# Patient Record
Sex: Male | Born: 1940
Health system: Southern US, Community
[De-identification: ages and names within clinical notes are randomized; demographics above are authoritative.]

## PROBLEM LIST (undated history)

## (undated) DIAGNOSIS — M5430 Sciatica, unspecified side: Secondary | ICD-10-CM

## (undated) DIAGNOSIS — E119 Type 2 diabetes mellitus without complications: Secondary | ICD-10-CM

## (undated) DIAGNOSIS — E785 Hyperlipidemia, unspecified: Secondary | ICD-10-CM

## (undated) DIAGNOSIS — R296 Repeated falls: Secondary | ICD-10-CM

## (undated) DIAGNOSIS — I10 Essential (primary) hypertension: Secondary | ICD-10-CM

## (undated) DIAGNOSIS — I82409 Acute embolism and thrombosis of unspecified deep veins of unspecified lower extremity: Secondary | ICD-10-CM

## (undated) DIAGNOSIS — Z7901 Long term (current) use of anticoagulants: Secondary | ICD-10-CM

## (undated) DIAGNOSIS — E11319 Type 2 diabetes mellitus with unspecified diabetic retinopathy without macular edema: Secondary | ICD-10-CM

## (undated) DIAGNOSIS — G629 Polyneuropathy, unspecified: Secondary | ICD-10-CM

## (undated) HISTORY — DX: Long term (current) use of anticoagulants: Z79.01

## (undated) HISTORY — PX: PILONIDAL CYST EXCISION: SHX744

## (undated) HISTORY — DX: Repeated falls: R29.6

## (undated) HISTORY — DX: Type 2 diabetes mellitus with unspecified diabetic retinopathy without macular edema: E11.319

---

## 1999-03-02 ENCOUNTER — Encounter: Payer: Self-pay | Admitting: Family Medicine

## 1999-03-02 ENCOUNTER — Encounter: Admission: RE | Admit: 1999-03-02 | Discharge: 1999-03-02 | Payer: Self-pay | Admitting: Family Medicine

## 2005-07-19 ENCOUNTER — Encounter: Admission: RE | Admit: 2005-07-19 | Discharge: 2005-07-19 | Payer: Self-pay | Admitting: Internal Medicine

## 2005-08-11 ENCOUNTER — Encounter: Admission: RE | Admit: 2005-08-11 | Discharge: 2005-08-11 | Payer: Self-pay | Admitting: Neurosurgery

## 2009-07-16 ENCOUNTER — Encounter: Admission: RE | Admit: 2009-07-16 | Discharge: 2009-07-16 | Payer: Self-pay | Admitting: Internal Medicine

## 2014-02-05 ENCOUNTER — Ambulatory Visit
Admission: RE | Admit: 2014-02-05 | Discharge: 2014-02-05 | Disposition: A | Discharge status: Home / Self Care | Payer: Medicare Other | Source: Ambulatory Visit | Attending: Internal Medicine | Admitting: Internal Medicine

## 2014-02-05 ENCOUNTER — Other Ambulatory Visit: Payer: Self-pay | Admitting: Internal Medicine

## 2014-02-05 DIAGNOSIS — S060X0A Concussion without loss of consciousness, initial encounter: Secondary | ICD-10-CM

## 2014-04-06 ENCOUNTER — Other Ambulatory Visit: Payer: Self-pay | Admitting: Internal Medicine

## 2014-04-06 DIAGNOSIS — R531 Weakness: Secondary | ICD-10-CM

## 2014-04-07 ENCOUNTER — Encounter (HOSPITAL_COMMUNITY)
Admission: EM | Disposition: A | Discharge status: Home / Self Care | Payer: Self-pay | Source: Home / Self Care | Attending: Neurosurgery

## 2014-04-07 ENCOUNTER — Ambulatory Visit
Admission: RE | Admit: 2014-04-07 | Discharge: 2014-04-07 | Disposition: A | Discharge status: Home / Self Care | Payer: Medicare Other | Source: Ambulatory Visit | Attending: Internal Medicine | Admitting: Internal Medicine

## 2014-04-07 ENCOUNTER — Encounter (HOSPITAL_COMMUNITY): Payer: Self-pay | Admitting: *Deleted

## 2014-04-07 ENCOUNTER — Emergency Department (HOSPITAL_COMMUNITY): Payer: Medicare Other | Admitting: Anesthesiology

## 2014-04-07 ENCOUNTER — Inpatient Hospital Stay (HOSPITAL_COMMUNITY)
Admission: EM | Admit: 2014-04-07 | Discharge: 2014-04-13 | DRG: 027 | Disposition: A | Discharge status: Home / Self Care | Payer: Medicare Other | Attending: Neurosurgery | Admitting: Neurosurgery

## 2014-04-07 DIAGNOSIS — S065X9A Traumatic subdural hemorrhage with loss of consciousness of unspecified duration, initial encounter: Secondary | ICD-10-CM

## 2014-04-07 DIAGNOSIS — Z7901 Long term (current) use of anticoagulants: Secondary | ICD-10-CM | POA: Diagnosis not present

## 2014-04-07 DIAGNOSIS — E119 Type 2 diabetes mellitus without complications: Secondary | ICD-10-CM | POA: Diagnosis present

## 2014-04-07 DIAGNOSIS — S065XAA Traumatic subdural hemorrhage with loss of consciousness status unknown, initial encounter: Secondary | ICD-10-CM

## 2014-04-07 DIAGNOSIS — S065X0A Traumatic subdural hemorrhage without loss of consciousness, initial encounter: Secondary | ICD-10-CM | POA: Diagnosis present

## 2014-04-07 DIAGNOSIS — G629 Polyneuropathy, unspecified: Secondary | ICD-10-CM | POA: Diagnosis present

## 2014-04-07 DIAGNOSIS — Z86718 Personal history of other venous thrombosis and embolism: Secondary | ICD-10-CM | POA: Diagnosis not present

## 2014-04-07 DIAGNOSIS — W19XXXA Unspecified fall, initial encounter: Secondary | ICD-10-CM | POA: Diagnosis present

## 2014-04-07 DIAGNOSIS — I6203 Nontraumatic chronic subdural hemorrhage: Secondary | ICD-10-CM | POA: Diagnosis present

## 2014-04-07 DIAGNOSIS — I10 Essential (primary) hypertension: Secondary | ICD-10-CM | POA: Diagnosis present

## 2014-04-07 DIAGNOSIS — M5430 Sciatica, unspecified side: Secondary | ICD-10-CM | POA: Diagnosis present

## 2014-04-07 DIAGNOSIS — R531 Weakness: Secondary | ICD-10-CM

## 2014-04-07 DIAGNOSIS — E785 Hyperlipidemia, unspecified: Secondary | ICD-10-CM | POA: Diagnosis present

## 2014-04-07 HISTORY — DX: Polyneuropathy, unspecified: G62.9

## 2014-04-07 HISTORY — DX: Essential (primary) hypertension: I10

## 2014-04-07 HISTORY — DX: Acute embolism and thrombosis of unspecified deep veins of unspecified lower extremity: I82.409

## 2014-04-07 HISTORY — DX: Sciatica, unspecified side: M54.30

## 2014-04-07 HISTORY — PX: CRANIOTOMY: SHX93

## 2014-04-07 HISTORY — DX: Type 2 diabetes mellitus without complications: E11.9

## 2014-04-07 HISTORY — DX: Hyperlipidemia, unspecified: E78.5

## 2014-04-07 LAB — BASIC METABOLIC PANEL
Anion gap: 6 (ref 5–15)
BUN: 28 mg/dL — ABNORMAL HIGH (ref 6–23)
CO2: 30 mmol/L (ref 19–32)
Calcium: 9.5 mg/dL (ref 8.4–10.5)
Chloride: 105 mmol/L (ref 96–112)
Creatinine, Ser: 1.19 mg/dL (ref 0.50–1.35)
GFR calc Af Amer: 68 mL/min — ABNORMAL LOW (ref >90)
GFR calc non Af Amer: 59 mL/min — ABNORMAL LOW (ref >90)
Glucose, Bld: 124 mg/dL — ABNORMAL HIGH (ref 70–99)
Potassium: 3.8 mmol/L (ref 3.5–5.1)
Sodium: 141 mmol/L (ref 135–145)

## 2014-04-07 LAB — APTT: aPTT: 29 seconds (ref 24–37)

## 2014-04-07 LAB — CBC
HCT: 35.8 % — ABNORMAL LOW (ref 39.0–52.0)
Hemoglobin: 12.2 g/dL — ABNORMAL LOW (ref 13.0–17.0)
MCH: 31.8 pg (ref 26.0–34.0)
MCHC: 34.1 g/dL (ref 30.0–36.0)
MCV: 93.2 fL (ref 78.0–100.0)
Platelets: 290 10*3/uL (ref 150–400)
RBC: 3.84 MIL/uL — ABNORMAL LOW (ref 4.22–5.81)
RDW: 13.1 % (ref 11.5–15.5)
WBC: 10.7 10*3/uL — ABNORMAL HIGH (ref 4.0–10.5)

## 2014-04-07 LAB — PROTIME-INR
INR: 2.3 — ABNORMAL HIGH (ref 0.00–1.49)
INR: 1.11 (ref 0.00–1.49)
Prothrombin Time: 25.5 seconds — ABNORMAL HIGH (ref 11.6–15.2)
Prothrombin Time: 14.4 seconds (ref 11.6–15.2)

## 2014-04-07 LAB — ABO/RH: ABO/RH(D): A POS

## 2014-04-07 LAB — TYPE AND SCREEN
ABO/RH(D): A POS
Antibody Screen: NEGATIVE

## 2014-04-07 LAB — I-STAT TROPONIN, ED: Troponin i, poc: 0 ng/mL (ref 0.00–0.08)

## 2014-04-07 SURGERY — CRANIOTOMY HEMATOMA EVACUATION SUBDURAL
Anesthesia: General | Site: Head | Laterality: Bilateral

## 2014-04-07 SURGERY — CRANIOTOMY HEMATOMA EVACUATION SUBDURAL
Anesthesia: General

## 2014-04-07 MED ORDER — BACITRACIN 50000 UNITS IM SOLR
INTRAMUSCULAR | Status: DC | PRN
  Administered 2014-04-07: 500 mL

## 2014-04-07 MED ORDER — GLYCOPYRROLATE 0.2 MG/ML IJ SOLN
INTRAMUSCULAR | Status: DC | PRN
  Administered 2014-04-07: .8 mg via INTRAVENOUS

## 2014-04-07 MED ORDER — PROTHROMBIN COMPLEX CONC HUMAN 500 UNITS IV KIT
2642.0000 [IU] | PACK | Status: AC
  Administered 2014-04-07: 2642 [IU] via INTRAVENOUS
  Filled 2014-04-07: qty 106

## 2014-04-07 MED ORDER — SODIUM CHLORIDE 0.9 % IJ SOLN
INTRAMUSCULAR | Status: AC
  Filled 2014-04-07: qty 10

## 2014-04-07 MED ORDER — PROMETHAZINE HCL 25 MG PO TABS: 12.5000 mg | ORAL_TABLET | ORAL | Status: DC | PRN

## 2014-04-07 MED ORDER — LABETALOL HCL 5 MG/ML IV SOLN
INTRAVENOUS | Status: AC
  Filled 2014-04-07: qty 4

## 2014-04-07 MED ORDER — LIDOCAINE-EPINEPHRINE 1 %-1:100000 IJ SOLN
INTRAMUSCULAR | Status: DC | PRN
  Administered 2014-04-07: 10 mL via INTRADERMAL

## 2014-04-07 MED ORDER — FENTANYL CITRATE 0.05 MG/ML IJ SOLN
INTRAMUSCULAR | Status: DC | PRN
  Administered 2014-04-07: 100 ug via INTRAVENOUS
  Administered 2014-04-07: 25 ug via INTRAVENOUS
  Administered 2014-04-07: 50 ug via INTRAVENOUS
  Administered 2014-04-07: 25 ug via INTRAVENOUS

## 2014-04-07 MED ORDER — ROCURONIUM BROMIDE 50 MG/5ML IV SOLN
INTRAVENOUS | Status: AC
  Filled 2014-04-07: qty 2

## 2014-04-07 MED ORDER — POTASSIUM CHLORIDE IN NACL 20-0.45 MEQ/L-% IV SOLN
INTRAVENOUS | Status: DC
  Administered 2014-04-08 – 2014-04-10 (×5): via INTRAVENOUS
  Filled 2014-04-07 (×9): qty 1000

## 2014-04-07 MED ORDER — HYDROCODONE-ACETAMINOPHEN 5-325 MG PO TABS
1.0000 | ORAL_TABLET | ORAL | Status: DC | PRN
  Administered 2014-04-08 – 2014-04-12 (×10): 1 via ORAL
  Filled 2014-04-07 (×10): qty 1

## 2014-04-07 MED ORDER — GLYCOPYRROLATE 0.2 MG/ML IJ SOLN
INTRAMUSCULAR | Status: AC
  Filled 2014-04-07: qty 5

## 2014-04-07 MED ORDER — ACETAMINOPHEN 650 MG RE SUPP: 650.0000 mg | RECTAL | Status: DC | PRN

## 2014-04-07 MED ORDER — FENTANYL CITRATE 0.05 MG/ML IJ SOLN
INTRAMUSCULAR | Status: AC
  Filled 2014-04-07: qty 5

## 2014-04-07 MED ORDER — LIDOCAINE HCL (CARDIAC) 20 MG/ML IV SOLN
INTRAVENOUS | Status: DC | PRN
  Administered 2014-04-07: 80 mg via INTRAVENOUS

## 2014-04-07 MED ORDER — ROCURONIUM BROMIDE 100 MG/10ML IV SOLN
INTRAVENOUS | Status: DC | PRN
  Administered 2014-04-07: 10 mg via INTRAVENOUS
  Administered 2014-04-07: 40 mg via INTRAVENOUS
  Administered 2014-04-07: 10 mg via INTRAVENOUS

## 2014-04-07 MED ORDER — EPHEDRINE SULFATE 50 MG/ML IJ SOLN
INTRAMUSCULAR | Status: AC
Start: 2014-04-07 — End: 2014-04-07
  Filled 2014-04-07: qty 1

## 2014-04-07 MED ORDER — PROPOFOL 10 MG/ML IV BOLUS
INTRAVENOUS | Status: DC | PRN
  Administered 2014-04-07: 120 mg via INTRAVENOUS
  Administered 2014-04-07: 40 mg via INTRAVENOUS

## 2014-04-07 MED ORDER — NEOSTIGMINE METHYLSULFATE 10 MG/10ML IV SOLN
INTRAVENOUS | Status: AC
Start: 2014-04-07 — End: 2014-04-07
  Filled 2014-04-07: qty 1

## 2014-04-07 MED ORDER — ONDANSETRON HCL 4 MG/2ML IJ SOLN
INTRAMUSCULAR | Status: AC
  Filled 2014-04-07: qty 2

## 2014-04-07 MED ORDER — NALOXONE HCL 0.4 MG/ML IJ SOLN: 0.0800 mg | INTRAMUSCULAR | Status: DC | PRN

## 2014-04-07 MED ORDER — CEFAZOLIN SODIUM-DEXTROSE 2-3 GM-% IV SOLR
2.0000 g | Freq: Three times a day (TID) | INTRAVENOUS | Status: AC
  Administered 2014-04-08 (×2): 2 g via INTRAVENOUS
  Filled 2014-04-07 (×2): qty 50

## 2014-04-07 MED ORDER — CEFAZOLIN SODIUM-DEXTROSE 2-3 GM-% IV SOLR
INTRAVENOUS | Status: DC | PRN
  Administered 2014-04-07: 2 g via INTRAVENOUS

## 2014-04-07 MED ORDER — ACETAMINOPHEN 325 MG PO TABS: 650.0000 mg | ORAL_TABLET | ORAL | Status: DC | PRN

## 2014-04-07 MED ORDER — LABETALOL HCL 5 MG/ML IV SOLN
INTRAVENOUS | Status: DC | PRN
  Administered 2014-04-07 (×2): 5 mg via INTRAVENOUS

## 2014-04-07 MED ORDER — PHENYLEPHRINE 40 MCG/ML (10ML) SYRINGE FOR IV PUSH (FOR BLOOD PRESSURE SUPPORT)
PREFILLED_SYRINGE | INTRAVENOUS | Status: AC
  Filled 2014-04-07: qty 10

## 2014-04-07 MED ORDER — ONDANSETRON HCL 4 MG/2ML IJ SOLN
INTRAMUSCULAR | Status: DC | PRN
  Administered 2014-04-07: 4 mg via INTRAVENOUS

## 2014-04-07 MED ORDER — SODIUM CHLORIDE 0.9 % IV SOLN
INTRAVENOUS | Status: DC | PRN
Start: 2014-04-07 — End: 2014-04-07
  Administered 2014-04-07 (×2): via INTRAVENOUS

## 2014-04-07 MED ORDER — DOCUSATE SODIUM 100 MG PO CAPS
100.0000 mg | ORAL_CAPSULE | Freq: Two times a day (BID) | ORAL | Status: DC
Start: 2014-04-07 — End: 2014-04-13
  Administered 2014-04-08 – 2014-04-13 (×11): 100 mg via ORAL
  Filled 2014-04-07 (×13): qty 1

## 2014-04-07 MED ORDER — ONDANSETRON HCL 4 MG PO TABS: 4.0000 mg | ORAL_TABLET | ORAL | Status: DC | PRN

## 2014-04-07 MED ORDER — BISACODYL 5 MG PO TBEC
5.0000 mg | DELAYED_RELEASE_TABLET | Freq: Every day | ORAL | Status: DC | PRN
  Filled 2014-04-07: qty 1

## 2014-04-07 MED ORDER — ONDANSETRON HCL 4 MG/2ML IJ SOLN: 4.0000 mg | INTRAMUSCULAR | Status: DC | PRN

## 2014-04-07 MED ORDER — HYDROMORPHONE HCL 1 MG/ML IJ SOLN
0.5000 mg | INTRAMUSCULAR | Status: DC | PRN
  Administered 2014-04-07: 1 mg via INTRAVENOUS
  Filled 2014-04-07: qty 1

## 2014-04-07 MED ORDER — PHENYLEPHRINE HCL 10 MG/ML IJ SOLN
INTRAMUSCULAR | Status: DC | PRN
  Administered 2014-04-07: 80 ug via INTRAVENOUS

## 2014-04-07 MED ORDER — LABETALOL HCL 5 MG/ML IV SOLN
10.0000 mg | INTRAVENOUS | Status: DC | PRN
  Administered 2014-04-07: 10 mg via INTRAVENOUS
  Administered 2014-04-09 (×2): 20 mg via INTRAVENOUS
  Administered 2014-04-09: 10 mg via INTRAVENOUS
  Administered 2014-04-09 – 2014-04-13 (×6): 20 mg via INTRAVENOUS
  Filled 2014-04-07: qty 8
  Filled 2014-04-07 (×10): qty 4

## 2014-04-07 MED ORDER — NEOSTIGMINE METHYLSULFATE 10 MG/10ML IV SOLN
INTRAVENOUS | Status: AC
  Filled 2014-04-07: qty 2

## 2014-04-07 MED ORDER — VITAMIN K1 10 MG/ML IJ SOLN
10.0000 mg | INTRAVENOUS | Status: AC
  Administered 2014-04-07: 10 mg via INTRAVENOUS
  Filled 2014-04-07: qty 1

## 2014-04-07 MED ORDER — NEOSTIGMINE METHYLSULFATE 10 MG/10ML IV SOLN
INTRAVENOUS | Status: DC | PRN
  Administered 2014-04-07: 5 mg via INTRAVENOUS

## 2014-04-07 MED ORDER — LIDOCAINE HCL (CARDIAC) 20 MG/ML IV SOLN
INTRAVENOUS | Status: AC
  Filled 2014-04-07: qty 5

## 2014-04-07 MED ORDER — PROPOFOL 10 MG/ML IV BOLUS
INTRAVENOUS | Status: AC
  Filled 2014-04-07: qty 20

## 2014-04-07 MED ORDER — PANTOPRAZOLE SODIUM 40 MG IV SOLR
40.0000 mg | Freq: Every day | INTRAVENOUS | Status: DC
  Administered 2014-04-08 – 2014-04-09 (×3): 40 mg via INTRAVENOUS
  Filled 2014-04-07 (×4): qty 40

## 2014-04-07 MED ORDER — 0.9 % SODIUM CHLORIDE (POUR BTL) OPTIME
TOPICAL | Status: DC | PRN
  Administered 2014-04-07 (×2): 1000 mL

## 2014-04-07 MED ORDER — DEXTROSE 5 % IV SOLN
10.0000 mg | INTRAVENOUS | Status: DC | PRN
  Administered 2014-04-07: 20 ug/min via INTRAVENOUS

## 2014-04-07 MED ORDER — THROMBIN 20000 UNITS EX SOLR
CUTANEOUS | Status: DC | PRN
  Administered 2014-04-07: 20 mL via TOPICAL

## 2014-04-07 SURGICAL SUPPLY — 66 items
APL SKNCLS STERI-STRIP NONHPOA (GAUZE/BANDAGES/DRESSINGS) ×1
BAG DECANTER FOR FLEXI CONT (MISCELLANEOUS) ×2 IMPLANT
BANDAGE GAUZE 4  KLING STR (GAUZE/BANDAGES/DRESSINGS) IMPLANT
BENZOIN TINCTURE PRP APPL 2/3 (GAUZE/BANDAGES/DRESSINGS) ×2 IMPLANT
BIT DRILL WIRE PASS 1.3MM (BIT) ×1 IMPLANT
BLADE CLIPPER SURG (BLADE) ×2 IMPLANT
BNDG GAUZE ELAST 4 BULKY (GAUZE/BANDAGES/DRESSINGS) IMPLANT
BRUSH SCRUB EZ 1% IODOPHOR (MISCELLANEOUS) IMPLANT
BRUSH SCRUB EZ PLAIN DRY (MISCELLANEOUS) ×2 IMPLANT
BUR ROUTER D-58 CRANI (BURR) ×1 IMPLANT
CANISTER SUCT 3000ML PPV (MISCELLANEOUS) ×2 IMPLANT
CLIP TI MEDIUM 6 (CLIP) IMPLANT
CONT SPEC 4OZ CLIKSEAL STRL BL (MISCELLANEOUS) ×2 IMPLANT
COVER BACK TABLE 60X90IN (DRAPES) ×4 IMPLANT
DRAPE NEUROLOGICAL W/INCISE (DRAPES) ×2 IMPLANT
DRAPE SURG 17X23 STRL (DRAPES) IMPLANT
DRAPE WARM FLUID 44X44 (DRAPE) ×2 IMPLANT
DRILL WIRE PASS 1.3MM (BIT) ×2
DRSG OPSITE POSTOP 4X6 (GAUZE/BANDAGES/DRESSINGS) ×2 IMPLANT
DRSG TELFA 3X8 NADH (GAUZE/BANDAGES/DRESSINGS) ×2 IMPLANT
ELECT CAUTERY BLADE 6.4 (BLADE) ×2 IMPLANT
ELECT REM PT RETURN 9FT ADLT (ELECTROSURGICAL) ×2
ELECTRODE REM PT RTRN 9FT ADLT (ELECTROSURGICAL) ×1 IMPLANT
GAUZE SPONGE 4X4 12PLY STRL (GAUZE/BANDAGES/DRESSINGS) IMPLANT
GAUZE SPONGE 4X4 16PLY XRAY LF (GAUZE/BANDAGES/DRESSINGS) IMPLANT
GLOVE ECLIPSE 8.0 STRL XLNG CF (GLOVE) ×2 IMPLANT
GLOVE EXAM NITRILE LRG STRL (GLOVE) IMPLANT
GLOVE EXAM NITRILE XL STR (GLOVE) IMPLANT
GLOVE EXAM NITRILE XS STR PU (GLOVE) IMPLANT
GOWN STRL REUS W/ TWL LRG LVL3 (GOWN DISPOSABLE) IMPLANT
GOWN STRL REUS W/ TWL XL LVL3 (GOWN DISPOSABLE) IMPLANT
GOWN STRL REUS W/TWL 2XL LVL3 (GOWN DISPOSABLE) ×2 IMPLANT
GOWN STRL REUS W/TWL LRG LVL3 (GOWN DISPOSABLE)
GOWN STRL REUS W/TWL XL LVL3 (GOWN DISPOSABLE)
HEMOSTAT SURGICEL 2X14 (HEMOSTASIS) ×2 IMPLANT
KIT BASIN OR (CUSTOM PROCEDURE TRAY) ×2 IMPLANT
KIT ROOM TURNOVER OR (KITS) ×2 IMPLANT
NEEDLE HYPO 22GX1.5 SAFETY (NEEDLE) ×2 IMPLANT
NS IRRIG 1000ML POUR BTL (IV SOLUTION) ×2 IMPLANT
PACK CRANIOTOMY (CUSTOM PROCEDURE TRAY) ×2 IMPLANT
PAD ARMBOARD 7.5X6 YLW CONV (MISCELLANEOUS) ×2 IMPLANT
PAD DRESSING TELFA 3X8 NADH (GAUZE/BANDAGES/DRESSINGS) ×1 IMPLANT
PAD EYE OVAL STERILE LF (GAUZE/BANDAGES/DRESSINGS) IMPLANT
PATTIES SURGICAL .25X.25 (GAUZE/BANDAGES/DRESSINGS) IMPLANT
PATTIES SURGICAL .5 X.5 (GAUZE/BANDAGES/DRESSINGS) IMPLANT
PATTIES SURGICAL .5 X3 (DISPOSABLE) IMPLANT
PATTIES SURGICAL .75X.75 (GAUZE/BANDAGES/DRESSINGS) ×2 IMPLANT
PATTIES SURGICAL 1X1 (DISPOSABLE) IMPLANT
PERFORATOR LRG  14-11MM (BIT) ×2
PERFORATOR LRG 14-11MM (BIT) ×1 IMPLANT
PIN MAYFIELD SKULL DISP (PIN) IMPLANT
PLATE 1.5  2HOLE LNG NEURO (Plate) ×12 IMPLANT
PLATE 1.5 2HOLE LNG NEURO (Plate) IMPLANT
SCREW SELF DRILL HT 1.5/4MM (Screw) ×12 IMPLANT
SPECIMEN JAR SMALL (MISCELLANEOUS) IMPLANT
SPONGE NEURO XRAY DETECT 1X3 (DISPOSABLE) IMPLANT
SPONGE SURGIFOAM ABS GEL 100 (HEMOSTASIS) ×2 IMPLANT
STAPLER SKIN PROX WIDE 3.9 (STAPLE) ×2 IMPLANT
SUT NURALON 4 0 TR CR/8 (SUTURE) ×4 IMPLANT
SUT VIC AB 2-0 OS6 18 (SUTURE) ×6 IMPLANT
SYR CONTROL 10ML LL (SYRINGE) ×2 IMPLANT
TOWEL OR 17X24 6PK STRL BLUE (TOWEL DISPOSABLE) ×2 IMPLANT
TOWEL OR 17X26 10 PK STRL BLUE (TOWEL DISPOSABLE) ×2 IMPLANT
TRAY FOLEY CATH 14FRSI W/METER (CATHETERS) IMPLANT
UNDERPAD 30X30 INCONTINENT (UNDERPADS AND DIAPERS) IMPLANT
WATER STERILE IRR 1000ML POUR (IV SOLUTION) ×2 IMPLANT

## 2014-04-07 SURGICAL SUPPLY — 62 items
APL SKNCLS STERI-STRIP NONHPOA (GAUZE/BANDAGES/DRESSINGS) ×1
BAG DECANTER FOR FLEXI CONT (MISCELLANEOUS) ×3 IMPLANT
BANDAGE GAUZE 4  KLING STR (GAUZE/BANDAGES/DRESSINGS) IMPLANT
BENZOIN TINCTURE PRP APPL 2/3 (GAUZE/BANDAGES/DRESSINGS) ×3 IMPLANT
BIT DRILL WIRE PASS 1.3MM (BIT) ×2 IMPLANT
BLADE CLIPPER SURG (BLADE) ×3 IMPLANT
BNDG GAUZE ELAST 4 BULKY (GAUZE/BANDAGES/DRESSINGS) IMPLANT
BRUSH SCRUB EZ 1% IODOPHOR (MISCELLANEOUS) IMPLANT
BRUSH SCRUB EZ PLAIN DRY (MISCELLANEOUS) ×3 IMPLANT
BUR ROUTER D-58 CRANI (BURR) IMPLANT
CANISTER SUCT 3000ML PPV (MISCELLANEOUS) ×3 IMPLANT
CLIP TI MEDIUM 6 (CLIP) IMPLANT
CONT SPEC 4OZ CLIKSEAL STRL BL (MISCELLANEOUS) ×3 IMPLANT
COVER BACK TABLE 60X90IN (DRAPES) ×6 IMPLANT
DRAPE NEUROLOGICAL W/INCISE (DRAPES) ×3 IMPLANT
DRAPE SURG 17X23 STRL (DRAPES) IMPLANT
DRAPE WARM FLUID 44X44 (DRAPE) ×3 IMPLANT
DRILL WIRE PASS 1.3MM (BIT) ×2
DRSG TELFA 3X8 NADH (GAUZE/BANDAGES/DRESSINGS) ×2 IMPLANT
ELECT CAUTERY BLADE 6.4 (BLADE) ×3 IMPLANT
ELECT REM PT RETURN 9FT ADLT (ELECTROSURGICAL) ×2
ELECTRODE REM PT RTRN 9FT ADLT (ELECTROSURGICAL) ×2 IMPLANT
GAUZE SPONGE 4X4 12PLY STRL (GAUZE/BANDAGES/DRESSINGS) IMPLANT
GAUZE SPONGE 4X4 16PLY XRAY LF (GAUZE/BANDAGES/DRESSINGS) IMPLANT
GLOVE ECLIPSE 8.0 STRL XLNG CF (GLOVE) ×3 IMPLANT
GLOVE EXAM NITRILE LRG STRL (GLOVE) IMPLANT
GLOVE EXAM NITRILE XL STR (GLOVE) IMPLANT
GLOVE EXAM NITRILE XS STR PU (GLOVE) IMPLANT
GOWN STRL REUS W/ TWL LRG LVL3 (GOWN DISPOSABLE) IMPLANT
GOWN STRL REUS W/ TWL XL LVL3 (GOWN DISPOSABLE) IMPLANT
GOWN STRL REUS W/TWL 2XL LVL3 (GOWN DISPOSABLE) ×3 IMPLANT
GOWN STRL REUS W/TWL LRG LVL3 (GOWN DISPOSABLE)
GOWN STRL REUS W/TWL XL LVL3 (GOWN DISPOSABLE)
HEMOSTAT SURGICEL 2X14 (HEMOSTASIS) ×3 IMPLANT
KIT BASIN OR (CUSTOM PROCEDURE TRAY) ×3 IMPLANT
KIT ROOM TURNOVER OR (KITS) ×3 IMPLANT
NEEDLE HYPO 22GX1.5 SAFETY (NEEDLE) ×3 IMPLANT
NS IRRIG 1000ML POUR BTL (IV SOLUTION) ×3 IMPLANT
PACK CRANIOTOMY (CUSTOM PROCEDURE TRAY) ×3 IMPLANT
PAD ARMBOARD 7.5X6 YLW CONV (MISCELLANEOUS) ×3 IMPLANT
PAD DRESSING TELFA 3X8 NADH (GAUZE/BANDAGES/DRESSINGS) ×1 IMPLANT
PAD EYE OVAL STERILE LF (GAUZE/BANDAGES/DRESSINGS) IMPLANT
PATTIES SURGICAL .25X.25 (GAUZE/BANDAGES/DRESSINGS) IMPLANT
PATTIES SURGICAL .5 X.5 (GAUZE/BANDAGES/DRESSINGS) IMPLANT
PATTIES SURGICAL .5 X3 (DISPOSABLE) IMPLANT
PATTIES SURGICAL .75X.75 (GAUZE/BANDAGES/DRESSINGS) ×3 IMPLANT
PATTIES SURGICAL 1X1 (DISPOSABLE) IMPLANT
PERFORATOR LRG  14-11MM (BIT) ×2
PERFORATOR LRG 14-11MM (BIT) ×2 IMPLANT
PIN MAYFIELD SKULL DISP (PIN) IMPLANT
SPECIMEN JAR SMALL (MISCELLANEOUS) IMPLANT
SPONGE NEURO XRAY DETECT 1X3 (DISPOSABLE) IMPLANT
SPONGE SURGIFOAM ABS GEL 100 (HEMOSTASIS) ×3 IMPLANT
STAPLER SKIN PROX WIDE 3.9 (STAPLE) ×3 IMPLANT
SUT NURALON 4 0 TR CR/8 (SUTURE) ×6 IMPLANT
SUT VIC AB 2-0 OS6 18 (SUTURE) ×9 IMPLANT
SYR CONTROL 10ML LL (SYRINGE) ×3 IMPLANT
TOWEL OR 17X24 6PK STRL BLUE (TOWEL DISPOSABLE) ×3 IMPLANT
TOWEL OR 17X26 10 PK STRL BLUE (TOWEL DISPOSABLE) ×3 IMPLANT
TRAY FOLEY CATH 14FRSI W/METER (CATHETERS) IMPLANT
UNDERPAD 30X30 INCONTINENT (UNDERPADS AND DIAPERS) IMPLANT
WATER STERILE IRR 1000ML POUR (IV SOLUTION) ×3 IMPLANT

## 2014-04-07 NOTE — Anesthesia Preprocedure Evaluation (Addendum)
Anesthesia Evaluation  Patient identified by MRN, date of birth, ID band Patient awake    Reviewed: NPO status   History of Anesthesia Complications Negative for: history of anesthetic complications  Airway Mallampati: II   Neck ROM: Full  Mouth opening: Limited Mouth Opening  Dental  (+) Teeth Intact   Pulmonary neg pulmonary ROS,  breath sounds clear to auscultation        Cardiovascular hypertension, DVT Rhythm:Regular Rate:Normal     Neuro/Psych  Neuromuscular disease    GI/Hepatic   Endo/Other  diabetes  Renal/GU      Musculoskeletal   Abdominal (+) + obese,   Peds  Hematology Had been on blood thinners   Anesthesia Other Findings   Reproductive/Obstetrics                            Anesthesia Physical Anesthesia Plan  ASA: III  Anesthesia Plan: General   Post-op Pain Management:    Induction: Intravenous  Airway Management Planned: Oral ETT  Additional Equipment:   Intra-op Plan:   Post-operative Plan: Extubation in OR  Informed Consent: I have reviewed the patients History and Physical, chart, labs and discussed the procedure including the risks, benefits and alternatives for the proposed anesthesia with the patient or authorized representative who has indicated his/her understanding and acceptance.   Dental advisory given  Plan Discussed with: CRNA and Surgeon  Anesthesia Plan Comments:         Anesthesia Quick Evaluation

## 2014-04-07 NOTE — ED Notes (Signed)
Consent obtained for bilateral craniotomy by Dr. Hal Neer.

## 2014-04-07 NOTE — ED Notes (Signed)
Pt went to pcp today due to unsteady gait and weakness for past week, had ct scan done and sent here due to bilateral subdural. Pt is awake, a&ox4 at triage.

## 2014-04-07 NOTE — ED Notes (Signed)
Dr. Hal Neer at bedside talking with patient.

## 2014-04-07 NOTE — Progress Notes (Signed)
eLink Physician-Brief Progress Note Patient Name: David Mendoza DOB: [birth year] MRN: [id number]   Date of Service  04/07/2014  HPI/Events of Note  74 yo male with PMH HLD, HTN, DM, Periferal Neuropathy and DVT. Now s/p crani and evacuation of subacute on chronic SDH. Currently on 2 L/min [place] O2 with sat = 98%. RR = 16 and BP = 147/66.  eICU Interventions  Continue current management. No intervention indicated at this time.      Intervention Category Evaluation Type: New Patient Evaluation  Lysle Dingwall 04/07/2014, 11:28 PM

## 2014-04-07 NOTE — ED Provider Notes (Signed)
Pt presents to the ED after an abnormal outpatient head CT. Physical Exam  BP 155/75 mmHg  Pulse 82  Temp(Src) 98.2 F (36.8 C) (Oral)  Resp 15  Ht 5' 9.5" (1.765 m)  Wt 216 lb 6.4 oz (98.158 kg)  BMI 31.51 kg/m2  SpO2 97%  Physical Exam  Constitutional: He is oriented to person, place, and time. He appears well-developed and well-nourished. No distress.  HENT:  Head: Normocephalic and atraumatic.  Right Ear: External ear normal.  Left Ear: External ear normal.  Eyes: Conjunctivae are normal. Right eye exhibits no discharge. Left eye exhibits no discharge. No scleral icterus.  Neck: Neck supple. No tracheal deviation present.  Cardiovascular: Normal rate.   Pulmonary/Chest: Effort normal. No stridor. No respiratory distress.  Musculoskeletal: He exhibits no edema.  Neurological: He is alert and oriented to person, place, and time. He has normal strength. No cranial nerve deficit or sensory deficit. GCS eye subscore is 4. GCS verbal subscore is 5. GCS motor subscore is 6.  Skin: Skin is warm and dry. No rash noted.  Psychiatric: He has a normal mood and affect.  Nursing note and vitals reviewed.   ED Course  Procedures  EKG Interpretation   Date/Time:  Tuesday April 07 2014 15:36:42 EDT Ventricular Rate:  89 PR Interval:  210 QRS Duration: 88 QT Interval:  360 QTC Calculation: 438 R Axis:   58 Text Interpretation:  Sinus rhythm with 1st degree A-V block Otherwise  normal ECG No old tracing to compare Confirmed by Markees Carns  MD-J, Pennye Beeghly ([id number])  on 04/07/2014 4:02:16 PM  Ct Head Wo Contrast  04/07/2014   CLINICAL DATA:  One week history of left-sided weakness and gait disturbance.  EXAM: CT HEAD WITHOUT CONTRAST  TECHNIQUE: Contiguous axial images were obtained from the base of the skull through the vertex without intravenous contrast.  COMPARISON:  February 05, 2014  FINDINGS: There are subdural hematomas bilaterally which show evidence of both acute and chronic fluid. The larger  subdural hematoma is present on the right causing mass effect on the right frontal lobe. This subdural hematoma measures 2.5 cm in maximum thickness. There is moderate mass effect from the left-sided subdural hematoma with a maximum thickness of 1.9 cm superiorly. There is more localized mass effect on the left frontal lobe more anteriorly with a maximum thickness of the subdural hematoma in this area of 1.5 cm. There is midline shift toward the left of 5 mm. There is no intra-axial mass or intra-axial hemorrhage. The gray-white compartments appear within normal limits. No acute infarct apparent. The bony calvarium appears intact. The mastoid air cells are clear.  IMPRESSION: Bilateral subdural hematomas causing mass effect. There is mixed attenuation suggesting both acute and more chronic hemorrhage in these areas. There is mild midline shift to the left. No acute infarct apparent. No intra-axial hemorrhage.  Critical Value/emergent results were called by telephone at the time of interpretation on 04/07/2014 at 2:19 pm to Dr. Josetta Huddle , who verbally acknowledged these results.   Electronically Signed   By: Lowella Grip III M.D.   On: 04/07/2014 14:19       MDM Pt has history of hygroma.  Had a fall the other night.  Balance has been gettying worse.  Outpatient ct scan today shows a subdural hematoma.  Pt is on coumadin for prior DVTs.  Will check INR.  Will likely need reversal.  He is stable right now.  INR is elevated at 2.3  Will reverse.  K Centra ordered.  Dr Hal Neer consulted.   Medical screening examination/treatment/procedure(s) were conducted as a shared visit with non-physician practitioner(s) and myself.  I personally evaluated the patient during the encounter.     Dorie Rank, MD 04/07/14 [phone]

## 2014-04-07 NOTE — ED Notes (Signed)
OR ready for patient. Pt's wife taking all belongings with her.

## 2014-04-07 NOTE — Op Note (Signed)
Preop diagnosis: Bilateral subacute and chronic subdural hematoma Postop diagnosis: Same Procedure: Bilateral frontoparietal craniotomy for evacuation of bilateral subdural Surgeon: Sandip Power  After being placed in the supine position the patient's scalp was shaved prepped and draped in the usual sterile fashion. Linear incisions were made bilaterally in the frontoparietal region and hemostasis was obtained with unipolar coagulation. A retractor was placed bilaterally. Single bur holes were placed in the exposed skull bilaterally and then a small craniotomy was performed after the dura had been separated from the bone with a Web designer. The bone flaps were removed bilaterally without difficulty and the dura was then opened in cruciate fashion. Chronic hematoma was identified with large amounts of fluid under great pressure encountered bilaterally. Irrigation was then carried out until the return was clear. 2 subdural drains were left in the subdural space and brought out through separate stab wound incisions. Gelfoam was then placed above the cranial defects after further irrigation revealed no further discolored fluid. The small bone flaps were reattached bilaterally with 3 small plates. The galea was then closed with inverted Vicryl and staples were placed on the skin. Sterile dressings were then placed bilaterally and the patient was extubated and taken to recovery room in stable condition.

## 2014-04-07 NOTE — ED Provider Notes (Signed)
CSN: [id number]     Arrival date & time 04/07/14  1521 History   First MD Initiated Contact with Patient 04/07/14 1557     Chief Complaint  Patient presents with  . Gait Problem     (Consider location/radiation/quality/duration/timing/severity/associated sxs/prior Treatment) The history is provided by the patient and the spouse. No language interpreter was used.  David Mendoza is a 74 y.o white male who presents for gradual onset weakness and "feeling off balance" for the past week which has progressively worsened. He originally fell in January and had a head CT done which showed a hematoma. He was seen by his pcp yesterday who did a repeat CT head and found the the patient had a bilateral subdural hematomas with midline shift. No pain. Walking and movement makes it worse. He denies any fever, vision changes, no headache, chest pain, shortness of breath, abdominal pain, urinary symptoms, or leg swelling.   Past Medical History  Diagnosis Date  . Hyperlipemia   . Hypertension   . Diabetes mellitus without complication   . Peripheral neuropathy   . DVT (deep venous thrombosis)   . Sciatica    History reviewed. No pertinent past surgical history. History reviewed. No pertinent family history. History  Substance Use Topics  . Smoking status: Not on file  . Smokeless tobacco: Not on file  . Alcohol Use: Not on file    Review of Systems  Musculoskeletal: Negative for neck stiffness.  All other systems reviewed and are negative.     Allergies  Sulfa antibiotics  Home Medications   Prior to Admission medications   Medication Sig Start Date End Date Taking? Authorizing Provider  amLODipine (NORVASC) 5 MG tablet Take 5 mg by mouth daily.   Yes Historical Provider, MD  glipiZIDE (GLUCOTROL) 5 MG tablet Take 5 mg by mouth 2 (two) times daily.   Yes Historical Provider, MD  hydrochlorothiazide (HYDRODIURIL) 12.5 MG tablet Take 12.5 mg by mouth daily.   Yes Historical Provider, MD   losartan (COZAAR) 100 MG tablet Take 100 mg by mouth daily.   Yes Historical Provider, MD  metFORMIN (GLUCOPHAGE) 500 MG tablet Take 1,000 mg by mouth 2 (two) times daily with a meal.   Yes Historical Provider, MD  omeprazole (PRILOSEC) 40 MG capsule Take 40 mg by mouth daily.   Yes Historical Provider, MD  rosuvastatin (CRESTOR) 20 MG tablet Take 20 mg by mouth daily.   Yes Historical Provider, MD  warfarin (COUMADIN) 10 MG tablet Take 5 mg by mouth daily.   Yes Historical Provider, MD   BP 151/80 mmHg  Pulse 76  Temp(Src) 98.2 F (36.8 C) (Oral)  Resp 13  Ht 5' 9.5" (1.765 m)  Wt 216 lb 6.4 oz (98.158 kg)  BMI 31.51 kg/m2  SpO2 99% Physical Exam  Constitutional: He is oriented to person, place, and time. He appears well-developed and well-nourished.  HENT:  Head: Normocephalic and atraumatic.  Eyes: Conjunctivae and EOM are normal.  Neck: Normal range of motion. Neck supple.  Cardiovascular: Normal rate, regular rhythm and normal heart sounds.   Pulmonary/Chest: Effort normal and breath sounds normal.  Abdominal: Soft. There is no tenderness.  Musculoskeletal: Normal range of motion.  Neurological: He is alert and oriented to person, place, and time. No sensory deficit. Coordination abnormal.  Left sided upper extremity weakness and decreased grip strength.   Skin: Skin is warm and dry.  Nursing note and vitals reviewed.   ED Course  Procedures (including critical care time)  Labs Review Labs Reviewed  CBC - Abnormal; Notable for the following:    WBC 10.7 (*)    RBC 3.84 (*)    Hemoglobin 12.2 (*)    HCT 35.8 (*)    All other components within normal limits  BASIC METABOLIC PANEL - Abnormal; Notable for the following:    Glucose, Bld 124 (*)    BUN 28 (*)    GFR calc non Af Amer 59 (*)    GFR calc Af Amer 68 (*)    All other components within normal limits  PROTIME-INR - Abnormal; Notable for the following:    Prothrombin Time 25.5 (*)    INR 2.30 (*)    All  other components within normal limits  PROTIME-INR  APTT  PROTIME-INR  PROTIME-INR  I-STAT TROPOININ, ED  TYPE AND SCREEN  ABO/RH    Imaging Review Ct Head Wo Contrast  04/07/2014   CLINICAL DATA:  One week history of left-sided weakness and gait disturbance.  EXAM: CT HEAD WITHOUT CONTRAST  TECHNIQUE: Contiguous axial images were obtained from the base of the skull through the vertex without intravenous contrast.  COMPARISON:  February 05, 2014  FINDINGS: There are subdural hematomas bilaterally which show evidence of both acute and chronic fluid. The larger subdural hematoma is present on the right causing mass effect on the right frontal lobe. This subdural hematoma measures 2.5 cm in maximum thickness. There is moderate mass effect from the left-sided subdural hematoma with a maximum thickness of 1.9 cm superiorly. There is more localized mass effect on the left frontal lobe more anteriorly with a maximum thickness of the subdural hematoma in this area of 1.5 cm. There is midline shift toward the left of 5 mm. There is no intra-axial mass or intra-axial hemorrhage. The gray-white compartments appear within normal limits. No acute infarct apparent. The bony calvarium appears intact. The mastoid air cells are clear.  IMPRESSION: Bilateral subdural hematomas causing mass effect. There is mixed attenuation suggesting both acute and more chronic hemorrhage in these areas. There is mild midline shift to the left. No acute infarct apparent. No intra-axial hemorrhage.  Critical Value/emergent results were called by telephone at the time of interpretation on 04/07/2014 at 2:19 pm to Dr. Josetta Huddle , who verbally acknowledged these results.   Electronically Signed   By: Lowella Grip III M.D.   On: 04/07/2014 14:19     EKG Interpretation   Date/Time:  Tuesday April 07 2014 15:36:42 EDT Ventricular Rate:  89 PR Interval:  210 QRS Duration: 88 QT Interval:  360 QTC Calculation: 438 R Axis:    58 Text Interpretation:  Sinus rhythm with 1st degree A-V block Otherwise  normal ECG No old tracing to compare Confirmed by KNAPP  MD-J, David Mendoza ([id number])  on 04/07/2014 4:02:16 PM      MDM   Final diagnoses:  Weakness  Subdural hematoma  Patient fell in January and CT head showed subdural hygeoma. He has had weakness for the past week and feeling off balance. He was seen by his pcp yesterday and had another head CT which showed bilateral subdural hematomas causing mass effect. There is mild midline shift to the left. No acute infarct apparent. No intra-axial hemorrhage.  He has not been seen by neurosurgery in the past.  I have discussed this patient with Dr. Tomi Bamberger who agrees with admission and he will put in orders for kcentra and vitamin K since he is on coumadin.  His coags are normal.  Neurosurgery was consulted and came to see the patient in the ED.  Patient agrees with the plan.       Ottie Glazier, PA-C 04/07/14 2144

## 2014-04-07 NOTE — H&P (Signed)
David Mendoza is an 74 y.o. male.   Chief Complaint: Bilateral subdural HPI: 74 yo male with minor head trauma in January. He had a CT head at that time that was unremarkable. Recently , he has developed balance issues, and mentioned this to his primary MD. He had a Ct head today and was sent to ED for eval.   Past Medical History  Diagnosis Date  . Hyperlipemia   . Hypertension   . Diabetes mellitus without complication   . Peripheral neuropathy   . DVT (deep venous thrombosis)   . Sciatica     History reviewed. No pertinent past surgical history.  History reviewed. No pertinent family history. Social History:  has no tobacco, alcohol, and drug history on file.  Allergies:  Allergies  Allergen Reactions  . Sulfa Antibiotics Other (See Comments)    Doesn't remember      (Not in a hospital admission)  Results for orders placed or performed during the hospital encounter of 04/07/14 (from the past 48 hour(s))  CBC     Status: Abnormal   Collection Time: 04/07/14  3:24 PM  Result Value Ref Range   WBC 10.7 (H) 4.0 - 10.5 K/uL   RBC 3.84 (L) 4.22 - 5.81 MIL/uL   Hemoglobin 12.2 (L) 13.0 - 17.0 g/dL   HCT 35.8 (L) 39.0 - 52.0 %   MCV 93.2 78.0 - 100.0 fL   MCH 31.8 26.0 - 34.0 pg   MCHC 34.1 30.0 - 36.0 g/dL   RDW 13.1 11.5 - 15.5 %   Platelets 290 150 - 400 K/uL  Basic metabolic panel     Status: Abnormal   Collection Time: 04/07/14  3:24 PM  Result Value Ref Range   Sodium 141 135 - 145 mmol/L   Potassium 3.8 3.5 - 5.1 mmol/L   Chloride 105 96 - 112 mmol/L   CO2 30 19 - 32 mmol/L   Glucose, Bld 124 (H) 70 - 99 mg/dL   BUN 28 (H) 6 - 23 mg/dL   Creatinine, Ser 1.19 0.50 - 1.35 mg/dL   Calcium 9.5 8.4 - 10.5 mg/dL   GFR calc non Af Amer 59 (L) >90 mL/min   GFR calc Af Amer 68 (L) >90 mL/min    Comment: (NOTE) The eGFR has been calculated using the CKD EPI equation. This calculation has not been validated in all clinical situations. eGFR's persistently <90 mL/min  signify possible Chronic Kidney Disease.    Anion gap 6 5 - 15  Protime-INR (if pt is taking Coumadin)     Status: Abnormal   Collection Time: 04/07/14  3:24 PM  Result Value Ref Range   Prothrombin Time 25.5 (H) 11.6 - 15.2 seconds   INR 2.30 (H) 0.00 - 1.49  I-stat troponin, ED (not at [facility])     Status: None   Collection Time: 04/07/14  4:09 PM  Result Value Ref Range   Troponin i, poc 0.00 0.00 - 0.08 ng/mL   Comment 3            Comment: Due to the release kinetics of cTnI, a negative result within the first hours of the onset of symptoms does not rule out myocardial infarction with certainty. If myocardial infarction is still suspected, repeat the test at appropriate intervals.   Type and screen     Status: None   Collection Time: 04/07/14  4:40 PM  Result Value Ref Range   ABO/RH(D) A POS    Antibody Screen NEG  Sample Expiration 04/10/2014   ABO/Rh     Status: None   Collection Time: 04/07/14  4:40 PM  Result Value Ref Range   ABO/RH(D) A POS    Ct Head Wo Contrast  04/07/2014   CLINICAL DATA:  One week history of left-sided weakness and gait disturbance.  EXAM: CT HEAD WITHOUT CONTRAST  TECHNIQUE: Contiguous axial images were obtained from the base of the skull through the vertex without intravenous contrast.  COMPARISON:  February 05, 2014  FINDINGS: There are subdural hematomas bilaterally which show evidence of both acute and chronic fluid. The larger subdural hematoma is present on the right causing mass effect on the right frontal lobe. This subdural hematoma measures 2.5 cm in maximum thickness. There is moderate mass effect from the left-sided subdural hematoma with a maximum thickness of 1.9 cm superiorly. There is more localized mass effect on the left frontal lobe more anteriorly with a maximum thickness of the subdural hematoma in this area of 1.5 cm. There is midline shift toward the left of 5 mm. There is no intra-axial mass or intra-axial hemorrhage. The  gray-white compartments appear within normal limits. No acute infarct apparent. The bony calvarium appears intact. The mastoid air cells are clear.  IMPRESSION: Bilateral subdural hematomas causing mass effect. There is mixed attenuation suggesting both acute and more chronic hemorrhage in these areas. There is mild midline shift to the left. No acute infarct apparent. No intra-axial hemorrhage.  Critical Value/emergent results were called by telephone at the time of interpretation on 04/07/2014 at 2:19 pm to Dr. Josetta Huddle , who verbally acknowledged these results.   Electronically Signed   By: Lowella Grip III M.D.   On: 04/07/2014 14:19    positive for diabetes  Blood pressure 147/83, pulse 76, temperature 98.2 F (36.8 C), temperature source Oral, resp. rate 30, height 5' 9.5" (1.765 m), weight 98.158 kg (216 lb 6.4 oz), SpO2 99 %.  awake, alert, conversant. No focal weakness, but gait not tested. Assessment/Plan CT head reviewed and shows significant extra axial fluid collections consistent with subdural hematomas with significant mass effect compared to scan of January. I have recommended evacuation, due to increasing mass effect. They have discussed risks and benefits. Questions answered. They have requested that we proceed with surgery, and will proceed tonight with surgery.  Faythe Ghee, MD 04/07/2014, 5:53 PM

## 2014-04-07 NOTE — Transfer of Care (Signed)
Immediate Anesthesia Transfer of Care Note  Patient: David Mendoza  Procedure(s) Performed: Procedure(s): BILATERAL CRANIOTOMY HEMATOMA EVACUATION SUBDURAL (Bilateral)  Patient Location: ICU  Anesthesia Type:General  Level of Consciousness: awake, alert  and oriented  Airway & Oxygen Therapy: Patient Spontanous Breathing and Patient connected to nasal cannula oxygen  Post-op Assessment: Report given to RN and Post -op Vital signs reviewed and stable  Post vital signs: Reviewed and stable  Last Vitals:  Filed Vitals:   04/07/14 1845  BP: 151/80  Pulse: 76  Temp:   Resp: 13    Complications: No apparent anesthesia complications

## 2014-04-07 NOTE — ED Notes (Signed)
Pt having gait abnormality, difficulty walking. Weak left sided, left side drift.

## 2014-04-08 ENCOUNTER — Inpatient Hospital Stay (HOSPITAL_COMMUNITY): Payer: Medicare Other

## 2014-04-08 ENCOUNTER — Encounter (HOSPITAL_COMMUNITY): Payer: Self-pay

## 2014-04-08 LAB — PROTIME-INR
INR: 1.16 (ref 0.00–1.49)
INR: 1.15 (ref 0.00–1.49)
INR: 1.15 (ref 0.00–1.49)
INR: 1.11 (ref 0.00–1.49)
Prothrombin Time: 14.9 seconds (ref 11.6–15.2)
Prothrombin Time: 14.8 seconds (ref 11.6–15.2)
Prothrombin Time: 14.8 seconds (ref 11.6–15.2)
Prothrombin Time: 14.5 seconds (ref 11.6–15.2)

## 2014-04-08 LAB — GLUCOSE, CAPILLARY
Glucose-Capillary: 98 mg/dL (ref 70–99)
Glucose-Capillary: 115 mg/dL — ABNORMAL HIGH (ref 70–99)

## 2014-04-08 LAB — MRSA PCR SCREENING: MRSA by PCR: NEGATIVE

## 2014-04-08 NOTE — Anesthesia Postprocedure Evaluation (Signed)
  Anesthesia Post-op Note  Patient: David Mendoza  Procedure(s) Performed: Procedure(s): BILATERAL CRANIOTOMY HEMATOMA EVACUATION SUBDURAL (Bilateral)  Patient Location: PACU  Anesthesia Type:General  Level of Consciousness: awake and alert   Airway and Oxygen Therapy: Patient Spontanous Breathing  Post-op Pain: mild  Post-op Assessment: Post-op Vital signs reviewed, Patient's Cardiovascular Status Stable and Respiratory Function Stable  Post-op Vital Signs: stable  Last Vitals:  Filed Vitals:   04/08/14 0755  BP:   Pulse:   Temp: 37.1 C  Resp:     Complications: No apparent anesthesia complications

## 2014-04-08 NOTE — Progress Notes (Signed)
Patient ID: David Mendoza, male   DOB: 1940/03/26, 74 y.o.   MRN: [id number] Afeb, vss No new neuro issues CT this morning looks much better. Small amount of residual blood, fair amount of pneumocephaly.  Will add O2 for air resorption. Continue present rx for now.

## 2014-04-09 LAB — PROTIME-INR
INR: 1.13 (ref 0.00–1.49)
Prothrombin Time: 14.6 seconds (ref 11.6–15.2)

## 2014-04-09 NOTE — Progress Notes (Signed)
Patient's bed alarm heard. Upon entering the room, Martinique Allen, RN found the patient standing at the bottom of the bed. I entered the room and saw he had been assisted to sitting on the edge of his bed. Found his right peripheral iv bleeding. Patient stated that "he woke up and got up" and he "didn't realize that [I] wasn't home". Upon reassessment, patient alert and oriented x4, JP drains intact. Patient assisted fully back to bed. Right iv was painful and removed by Norberta Keens, [employer] Nursing Student.

## 2014-04-09 NOTE — Progress Notes (Signed)
Chaplain initiated visit with pt and family. Chaplain introduced herself and her services. Pt requested prayer. Page chaplain as needed.    04/09/14 1100  Clinical Encounter Type  Visited With Patient and family together  Visit Type Initial;Spiritual support  Spiritual Encounters  Spiritual Needs Prayer;Emotional  Stress Factors  Patient Stress Factors Health changes  Kennadee Walthour, Barbette Hair, Chaplain 04/09/2014 11:07 AM

## 2014-04-09 NOTE — Progress Notes (Signed)
Patient ID: David Mendoza, male   DOB: 1940/12/29, 74 y.o.   MRN: [id number] Afeb, vss No new neuro issues Drains still w small amount of output Wounds clean and dry No headache Will recheck CT head tomorrow, and probably remove drains at that time.

## 2014-04-10 ENCOUNTER — Inpatient Hospital Stay (HOSPITAL_COMMUNITY): Payer: Medicare Other

## 2014-04-10 LAB — PROTIME-INR
INR: 1.11 (ref 0.00–1.49)
Prothrombin Time: 14.5 seconds (ref 11.6–15.2)

## 2014-04-10 MED ORDER — PANTOPRAZOLE SODIUM 40 MG PO TBEC
40.0000 mg | DELAYED_RELEASE_TABLET | Freq: Every day | ORAL | Status: DC
  Administered 2014-04-10 – 2014-04-12 (×3): 40 mg via ORAL
  Filled 2014-04-10 (×3): qty 1

## 2014-04-10 NOTE — Progress Notes (Signed)
Patient ID: David Mendoza, male   DOB: Nov 25, 1940, 74 y.o.   MRN: [id number] Afeb, vss No new issues CT continues to look better Drains still with some output; Will recheck CT once more tomorrow and then d/c drains Canm then be transferred out of ICU with d/c the day after that assuming he is well neurologically

## 2014-04-10 NOTE — Evaluation (Signed)
Physical Therapy Evaluation Patient Details Name: JESSICA SEIDMAN MRN: [id number] DOB: 1940/09/07 Today's Date: 04/10/2014   History of Present Illness  74 yo male with minor head trauma in January. He had a CT head at that time that was unremarkable. Recently , he has developed balance issues, and mentioned this to his primary MD. He had a Ct head today and was sent to ED for eval. 74 yo male with minor head trauma in January. He had a CT head at that time that was unremarkable. Recently , he has developed balance issues, and mentioned this to his primary MD. He had a Ct head today and was sent to ED for eval. Pt s/p bilateral frontoparietal craniotomy for evacuation of bilateral subdural  Clinical Impression  Pt admitted with/for Bil SDH that was affect pt's balance; he is s/p bil crani for evacuation of SDH;s.  Pt currently limited functionally due to the problems listed below.  (see problems list.)  Pt will benefit from PT to maximize function and safety to be able to get home safely with available assist of family.     Follow Up Recommendations No PT follow up    Equipment Recommendations  None recommended by PT    Recommendations for Other Services       Precautions / Restrictions Precautions Precautions:  (minimal fall risk)      Mobility  Bed Mobility               General bed mobility comments: not tested-- up in the chair  Transfers Overall transfer level: Needs assistance   Transfers: Sit to/from Stand Sit to Stand: Supervision         General transfer comment: safe transition  Ambulation/Gait Ambulation/Gait assistance: Supervision Ambulation Distance (Feet): 400 Feet Assistive device: None Gait Pattern/deviations: Step-through pattern Gait velocity: moderate speed Gait velocity interpretation: at or above normal speed for age/gender General Gait Details: mild limping gait intially that improved with distance; generally steady  throughout.  Stairs Stairs: Yes Stairs assistance: Supervision Stair Management: One rail Left;Alternating pattern;Forwards Number of Stairs: 3 General stair comments: safe with rail  Wheelchair Mobility    Modified Rankin (Stroke Patients Only)       Balance Overall balance assessment: Needs assistance Sitting-balance support: Feet supported;No upper extremity supported Sitting balance-Leahy Scale: Good     Standing balance support: No upper extremity supported Standing balance-Leahy Scale: Fair                               Pertinent Vitals/Pain Pain Assessment: No/denies pain    Home Living Family/patient expects to be discharged to:: Private residence Living Arrangements: Spouse/significant other Available Help at Discharge: Family;Available PRN/intermittently (wife works, but pt will notify her that MD may want her arou) Type of Home: House Home Access: Stairs to enter Entrance Stairs-Rails: None Entrance Stairs-Number of Steps: 3 Home Layout: Two level;Able to live on main level with bedroom/bathroom Home Equipment: None      Prior Function Level of Independence: Independent               Hand Dominance        Extremity/Trunk Assessment               Lower Extremity Assessment: Overall WFL for tasks assessed (very mild hip flexor weakness)         Communication   Communication: No difficulties  Cognition Arousal/Alertness: Awake/alert Behavior During Therapy: [facility]  for tasks assessed/performed Overall Cognitive Status: Within Functional Limits for tasks assessed                      General Comments General comments (skin integrity, edema, etc.): General vital remained stable    Exercises        Assessment/Plan    PT Assessment Patient needs continued PT services  PT Diagnosis Abnormality of gait   PT Problem List Decreased activity tolerance;Decreased balance;Decreased mobility  PT Treatment  Interventions Gait training;Functional mobility training;Therapeutic activities;Stair training;Patient/family education   PT Goals (Current goals can be found in the Care Plan section) Acute Rehab PT Goals Patient Stated Goal: independent and hopefully back to work PT Goal Formulation: With patient Time For Goal Achievement: 04/17/14 Potential to Achieve Goals: Good    Frequency Min 3X/week   Barriers to discharge        Co-evaluation               End of Session   Activity Tolerance: Patient tolerated treatment well Patient left: in chair;with call bell/phone within reach Nurse Communication: Mobility status         Time: 0459-9774 PT Time Calculation (min) (ACUTE ONLY): 19 min   Charges:   PT Evaluation $Initial PT Evaluation Tier I: 1 Procedure     PT G Codes:        Cannie Muckle, Tessie Fass 04/10/2014, 5:07 PM 04/10/2014  Donnella Sham, PT [phone]  (pager)

## 2014-04-11 ENCOUNTER — Encounter (HOSPITAL_COMMUNITY): Payer: Self-pay | Admitting: Radiology

## 2014-04-11 ENCOUNTER — Inpatient Hospital Stay (HOSPITAL_COMMUNITY): Payer: Medicare Other

## 2014-04-11 ENCOUNTER — Telehealth (HOSPITAL_COMMUNITY): Payer: Self-pay | Admitting: Pharmacist

## 2014-04-11 NOTE — Telephone Encounter (Deleted)
Review Med-Hx.

## 2014-04-11 NOTE — Progress Notes (Signed)
Patient ID: David Mendoza, male   DOB: October 13, 1940, 74 y.o.   MRN: [id number] Neuro stable, no weaknes. Drain still working. Ct shows residual sdh.

## 2014-04-11 NOTE — Telephone Encounter (Signed)
No phone call made 

## 2014-04-12 NOTE — Progress Notes (Signed)
Pt arrived to 4N13 via wheelchair.  Pt and family welcomed and settled into the room.  No c/o pain or distress. Pt A/O x4.  Will continue to monitor. Cori Razor, RN

## 2014-04-12 NOTE — Progress Notes (Signed)
Patient ID: David Mendoza, male   DOB: 08-14-1940, 74 y.o.   MRN: [id number] Doing great. No headache. Both drains were removed

## 2014-04-13 NOTE — Progress Notes (Signed)
Pt discharged with his wife alert, verbal taking all personal belongings. Pt denied pain or discomfort. IV discontinued, applied dry dressing. Telemetry discontinued. Discharge instructions provided with verbal understanding. Pt aware of follow up appt to be arranged with Neurology. No assistive devices needed for home discharge. No noted distress. Surgical site to bilateral head clean, dry, and intact.

## 2014-04-13 NOTE — Progress Notes (Signed)
Utilization review completed. Carles Collet RN BSN CM

## 2014-04-13 NOTE — Progress Notes (Signed)
CARE MANAGEMENT NOTE 04/13/2014  Patient:  David Mendoza, David Mendoza   Account Number:  [id number]  Date Initiated:  04/13/2014  Documentation initiated by:  Carles Collet  Subjective/Objective Assessment:   pt from home lives with spouse, fell in january on black ice had -CT but returned this admission for symtoms related to subdural hematoma. sx on 3/29.     Action/Plan:   will follow for any discharge needs   Anticipated DC Date:  04/14/2014   Anticipated DC Plan:  HOME/SELF CARE         Choice offered to / List presented to:             Status of service:  In process, will continue to follow Medicare Important Message given?  YES (If response is "NO", the following Medicare IM given date fields will be blank) Date Medicare IM given:  04/13/2014 Medicare IM given by:  Carles Collet Date Additional Medicare IM given:   Additional Medicare IM given by:    Discharge Disposition:    Per UR Regulation:  Reviewed for med. necessity/level of care/duration of stay  If discussed at [place] of Stay Meetings, dates discussed:    Comments:

## 2014-04-13 NOTE — Progress Notes (Signed)
CARE MANAGEMENT NOTE 04/13/2014  Patient:  David Mendoza, David Mendoza   Account Number:  [id number]  Date Initiated:  04/13/2014  Documentation initiated by:  Carles Collet  Subjective/Objective Assessment:   pt from home lives with spouse, fell in january on black ice had -CT but returned this admission for symtoms related to subdural hematoma. sx on 3/29.     Action/Plan:   will follow for any discharge needs   Anticipated DC Date:  04/14/2014   Anticipated DC Plan:  HOME/SELF CARE         Choice offered to / List presented to:             Status of service:  In process, will continue to follow Medicare Important Message given?  YES (If response is "NO", the following Medicare IM given date fields will be blank) Date Medicare IM given:  04/13/2014 Medicare IM given by:  Carles Collet Date Additional Medicare IM given:   Additional Medicare IM given by:    Discharge Disposition:    Per UR Regulation:    If discussed at Long Length of Stay Meetings, dates discussed:    Comments:

## 2014-04-13 NOTE — Progress Notes (Signed)
Pt wanting to be discharged stating that MD told him that he would be discharged today. Called office spoke with nurse who stated that MD would be in this afternoon to discharge pt. Pt alert, verbal with no noted distress. Surgical dressing and staples in place bilateral to head clean, dry and intact. No drainage. He denies pain or discomfort. Ambulated in the  hall 200 ft without assistive device. Gait steady, stanby assist. Wife has been at his side. Pt noted up in a chair watching television at this time. Safety measures in place. Call bell within reach. Will continue to monitor.

## 2014-04-21 NOTE — Discharge Summary (Signed)
Physician Discharge Summary  Patient ID: David Mendoza MRN: [id number] DOB/AGE: March 20, 1940 74 y.o.  Admit date: 04/07/2014 Discharge date: 04/21/2014  Admission Diagnoses:  Discharge Diagnoses:  Active Problems:   Chronic subdural hematoma   Discharged Condition: good  Hospital Course: The patient was noted also for treatment of bilateral subdural hematomas. Underwent burr hole evacuation bilaterally. Patient did well. Follow-up scans reveal improved appearance. Drains were removed. Patient was mobilized. Ready for discharge home.  Consults:   Significant Diagnostic Studies:   Treatments:   Discharge Exam: Blood pressure 190/86, pulse 78, temperature 98.2 F (36.8 C), temperature source Oral, resp. rate 18, height 5' 9.5" (1.765 m), weight 98.158 kg (216 lb 6.4 oz), SpO2 100 %. Awake and alert. Oriented and appropriate. Motor sensory function intact. Wounds clean and dry. Chest and abdomen benign.  Disposition: 01-Home or Self Care     Medication List    STOP taking these medications        warfarin 10 MG tablet  Commonly known as:  COUMADIN      TAKE these medications        amLODipine 5 MG tablet  Commonly known as:  NORVASC  Take 5 mg by mouth daily.     glipiZIDE 5 MG tablet  Commonly known as:  GLUCOTROL  Take 5 mg by mouth 2 (two) times daily.     hydrochlorothiazide 12.5 MG tablet  Commonly known as:  HYDRODIURIL  Take 12.5 mg by mouth daily.     losartan 100 MG tablet  Commonly known as:  COZAAR  Take 100 mg by mouth daily.     metFORMIN 500 MG tablet  Commonly known as:  GLUCOPHAGE  Take 1,000 mg by mouth 2 (two) times daily with a meal.     omeprazole 40 MG capsule  Commonly known as:  PRILOSEC  Take 40 mg by mouth daily.     rosuvastatin 20 MG tablet  Commonly known as:  CRESTOR  Take 20 mg by mouth daily.           Follow-up Information    Follow up with Faythe Ghee, MD.   Specialty:  Neurosurgery   Contact information:   [address] [place] [address] [phone]       Signed: Charlie Pitter 04/21/2014, 8:06 AM

## 2014-05-06 ENCOUNTER — Other Ambulatory Visit: Payer: Self-pay | Admitting: Neurosurgery

## 2014-05-06 DIAGNOSIS — I6203 Nontraumatic chronic subdural hemorrhage: Secondary | ICD-10-CM

## 2014-05-11 ENCOUNTER — Ambulatory Visit
Admission: RE | Admit: 2014-05-11 | Discharge: 2014-05-11 | Disposition: A | Discharge status: Home / Self Care | Payer: Medicare Other | Source: Ambulatory Visit | Attending: Neurosurgery | Admitting: Neurosurgery

## 2014-05-11 DIAGNOSIS — I6203 Nontraumatic chronic subdural hemorrhage: Secondary | ICD-10-CM

## 2016-04-03 IMAGING — CT CT HEAD W/O CM
2 series · 16 of 30 positions shown, 18 images · non-contrast
Comparison: None.

CLINICAL DATA: Loss of consciousness. Intracranial hemorrhage.
Dizziness and headaches. Hematoma on the posterior scalp.

EXAM:
CT HEAD WITHOUT CONTRAST
TECHNIQUE: Contiguous axial images were obtained from the base of the skull
through the vertex without intravenous contrast.

[Series 3: head bone · axial · 0.49mm/px · z∈[+35,+167]mm · 8 of 64 slices shown]
[im 7/64  bone]
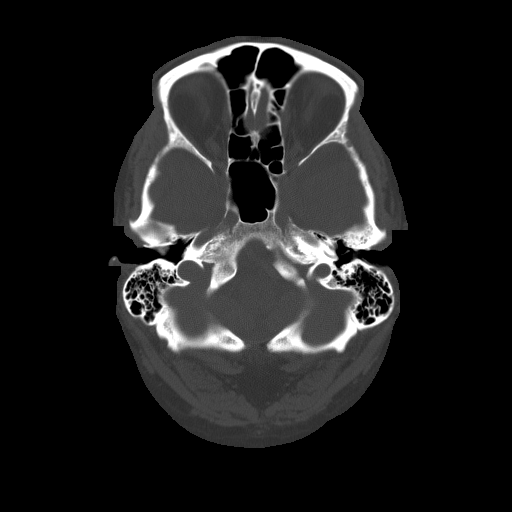
[im 14/64  bone]
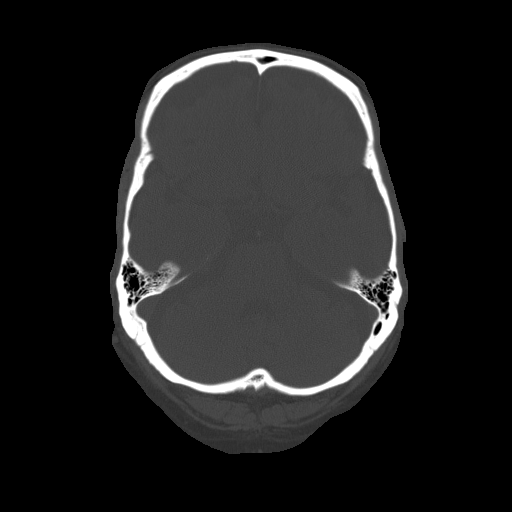
[im 20/64  bone]
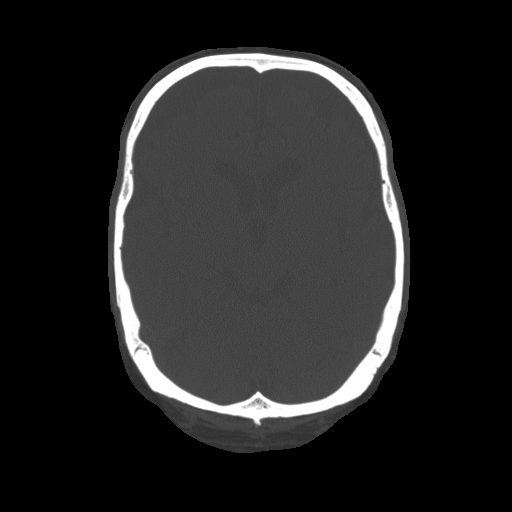
[im 27/64  bone]
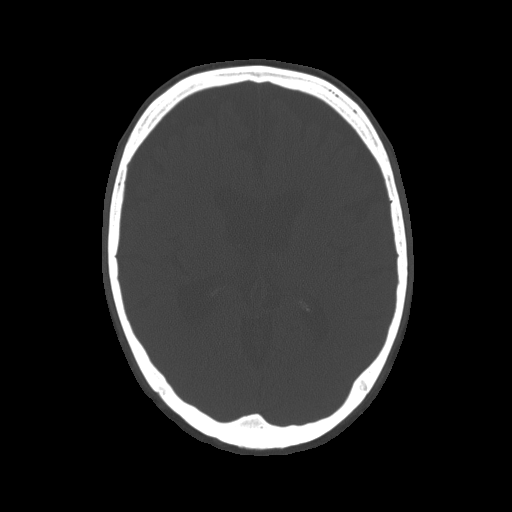
[im 37/64  bone]
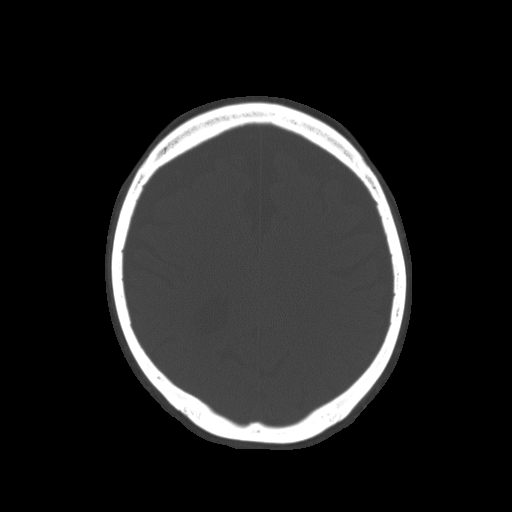
[im 44/64  bone]
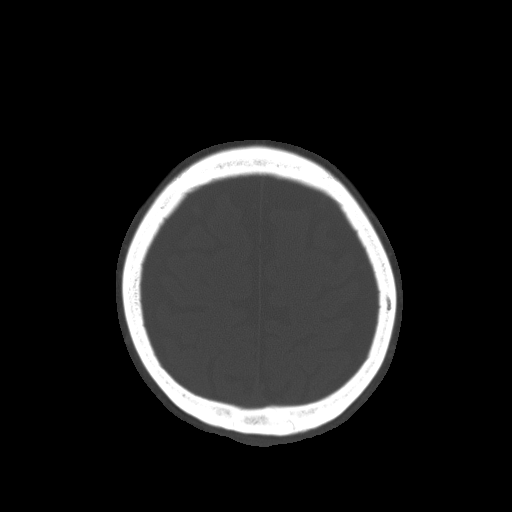
[im 50/64  bone]
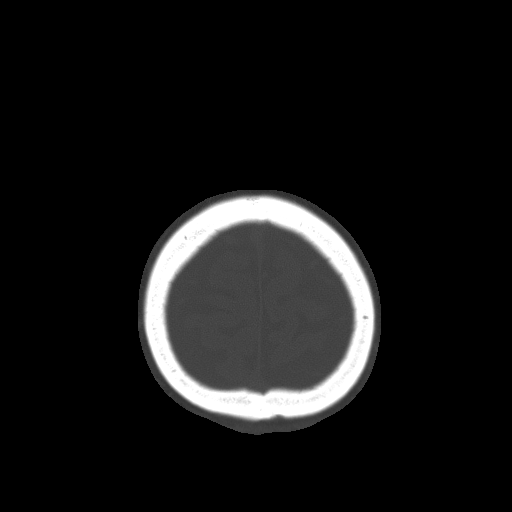
[im 57/64  bone]
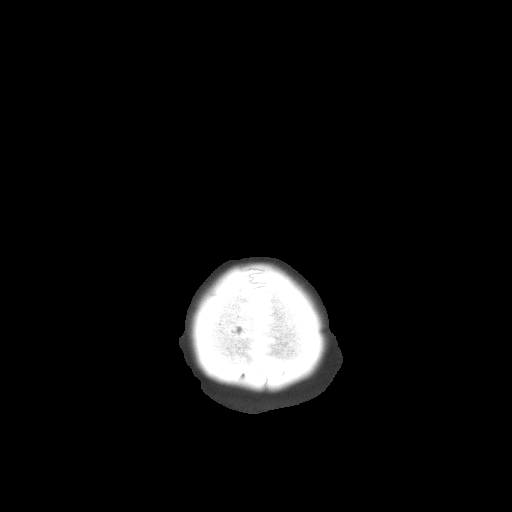

[Series 32: 3d filtered head w/o · axial · non-contrast · 0.49mm/px · z∈[+36,+163]mm · 8 of 32 slices shown, 10 images]
[im 4/32  brain]
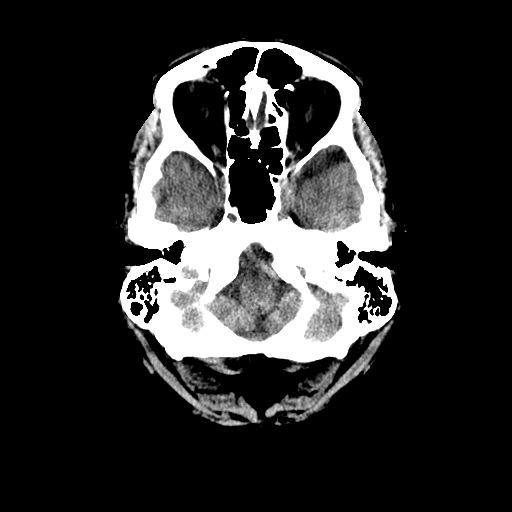
[im 4/32  bone]
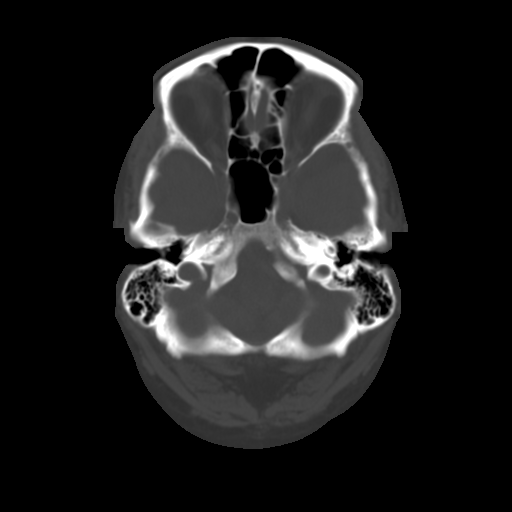
[im 7/32  brain]
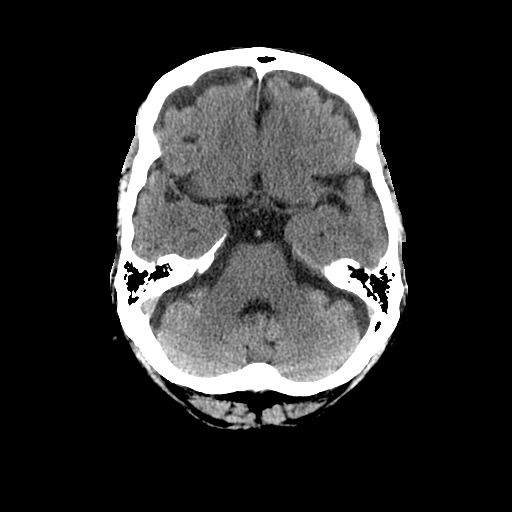
[im 11/32  brain]
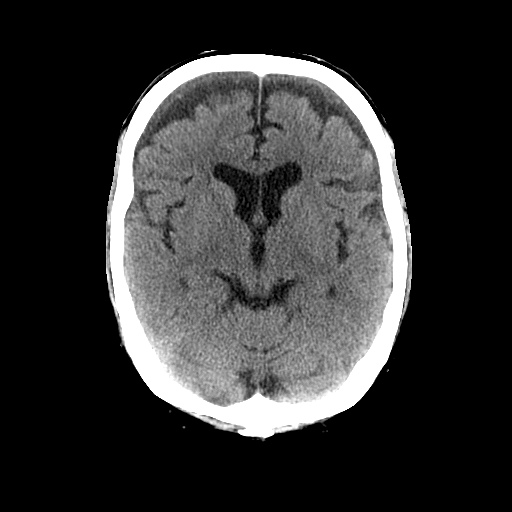
[im 14/32  brain]
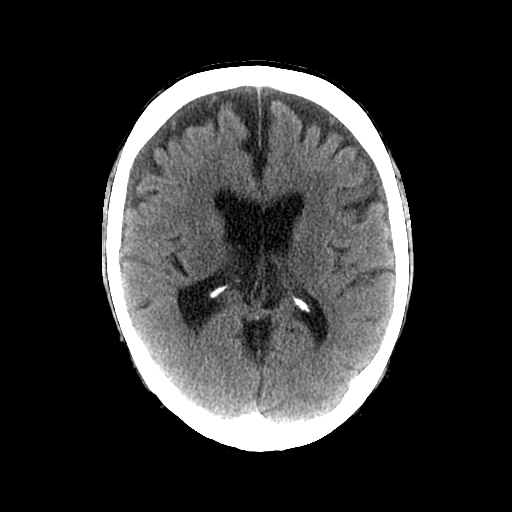
[im 18/32  brain]
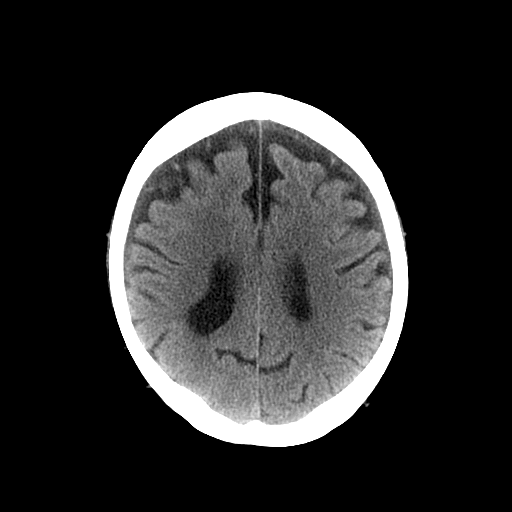
[im 18/32  bone]
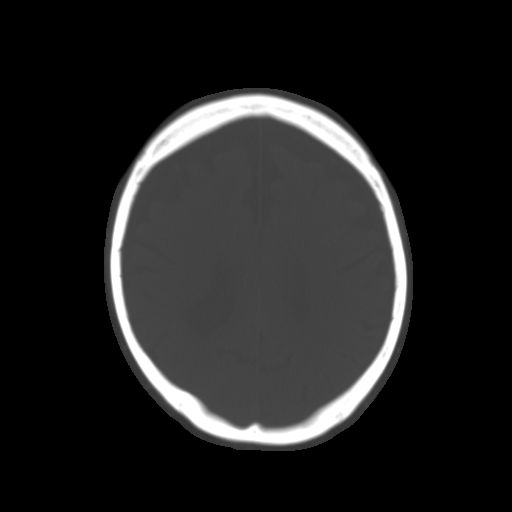
[im 21/32  brain]
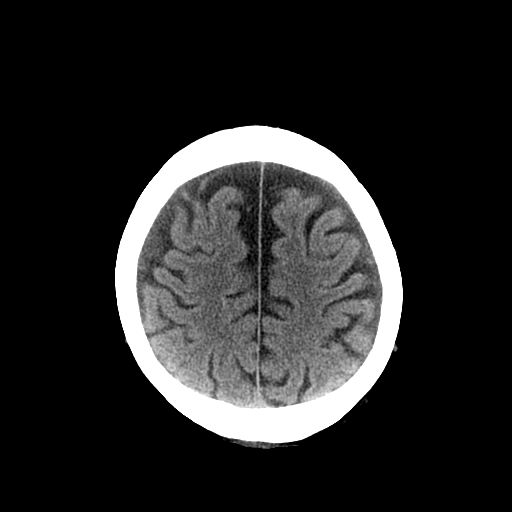
[im 25/32  brain]
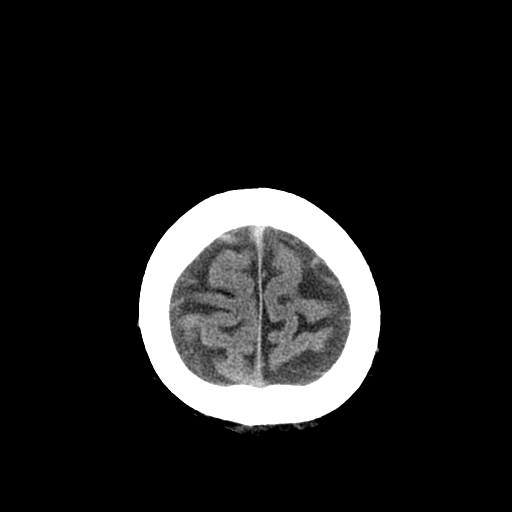
[im 28/32  brain]
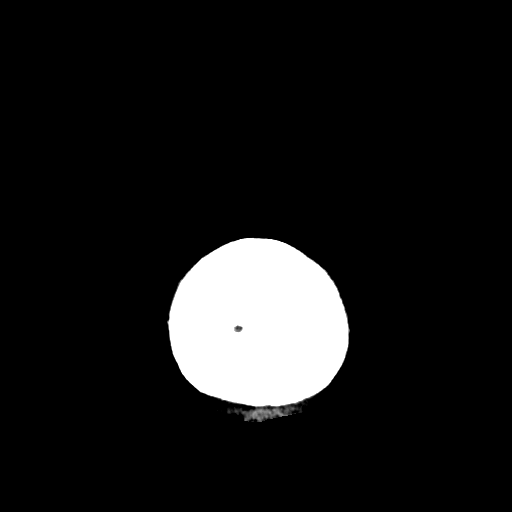

[16 of 30 positions shown; findings below may reference images not displayed]

FINDINGS: Soft tissue contusion and small hematoma are present over the
posterior parietal scalp near the vertex. There is no underlying
skull fracture.

No mass lesion, mass effect, midline shift, hydrocephalus,
hemorrhage. No acute territorial cortical ischemia/infarct. Atrophy
and chronic ischemic white matter disease is present.

Bilateral subdural hygromas are present over the frontal lobes.
Posterior fossa structures appear within normal limits.
IMPRESSION: Atrophy, chronic ischemic white matter disease and subdural hygromas
without acute intracranial abnormality. Parietal scalp contusion and
hematoma.

## 2016-06-03 IMAGING — CT CT HEAD W/O CM
2 series · 15 of 30 positions shown, 19 images · non-contrast
Comparison: February 05, 2014

CLINICAL DATA: One week history of left-sided weakness and gait
disturbance.

EXAM:
CT HEAD WITHOUT CONTRAST
TECHNIQUE: Contiguous axial images were obtained from the base of the skull
through the vertex without intravenous contrast.

[Series 3: head bone · axial · 0.49mm/px · z∈[+27,+48]mm · 2 of 32 slices shown]
[im 3/32  bone]
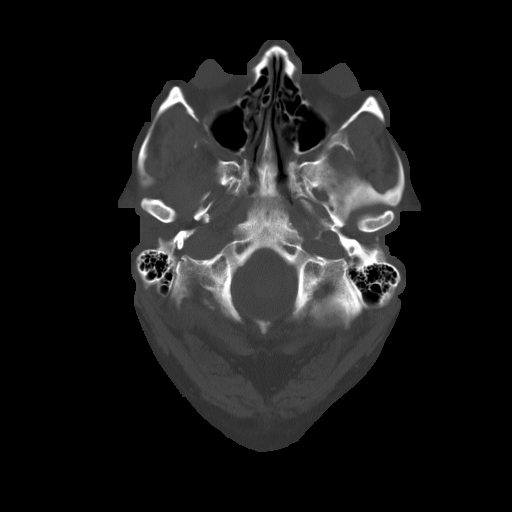
[im 7/32  bone]
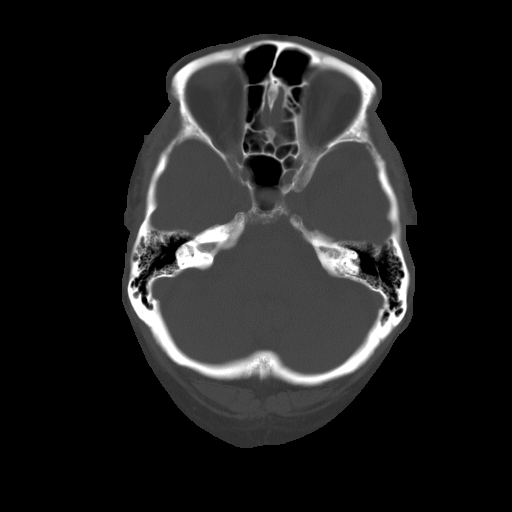

[Series 32: 3d filtered head w/o · axial · non-contrast · 0.49mm/px · z∈[+27,+163]mm · 13 of 32 slices shown, 17 images]
[im 3/32  brain]
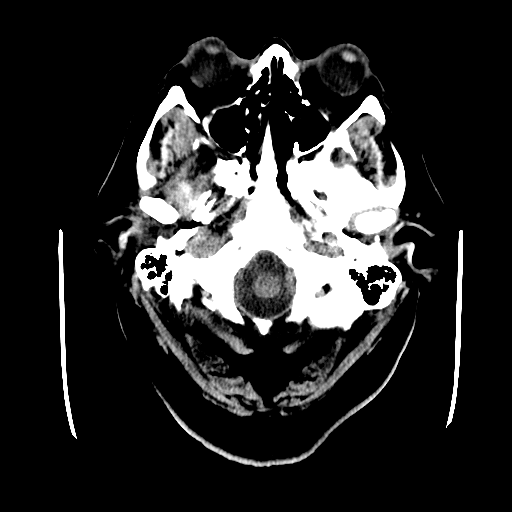
[im 3/32  bone]
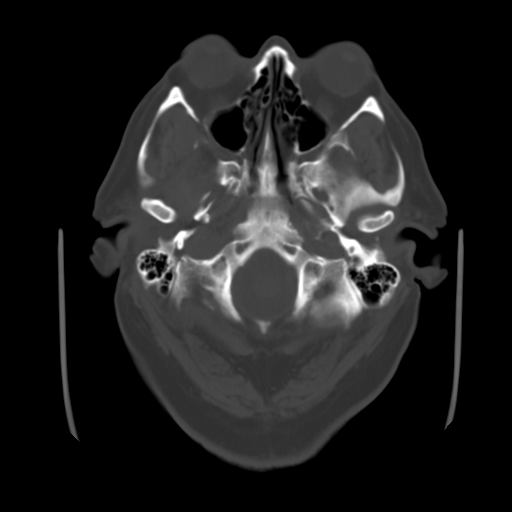
[im 5/32  brain]
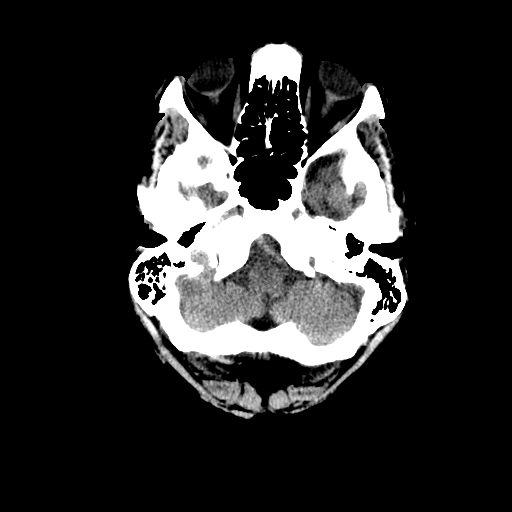
[im 7/32  brain]
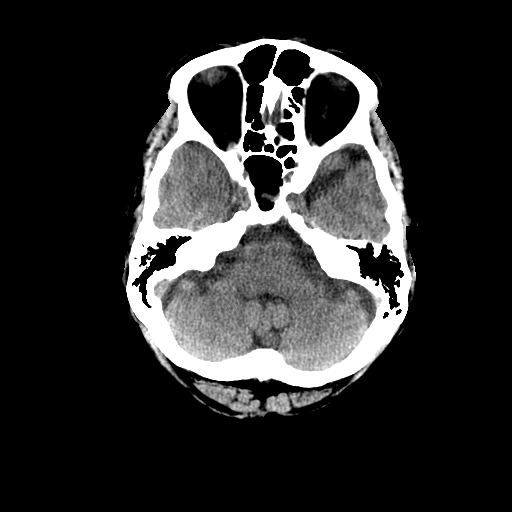
[im 9/32  brain]
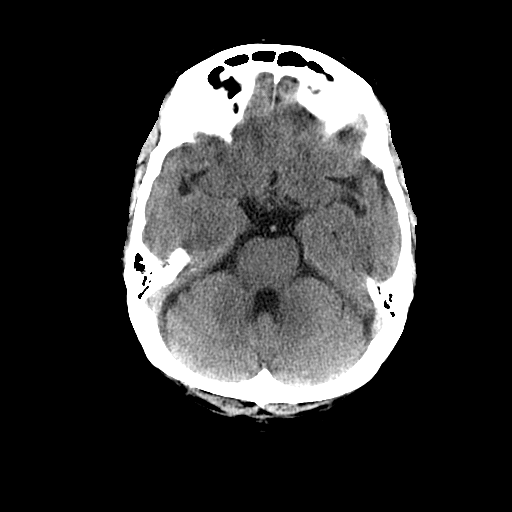
[im 12/32  brain]
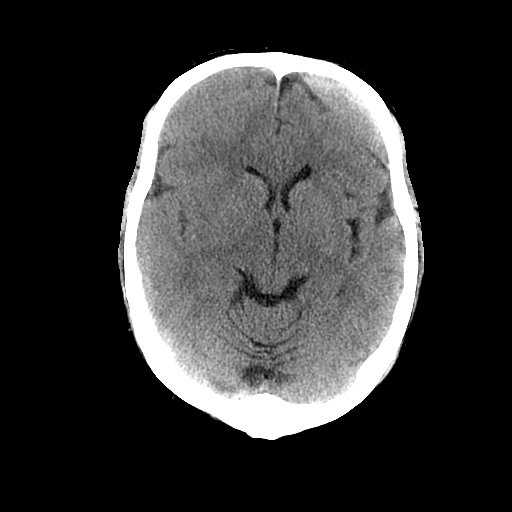
[im 12/32  bone]
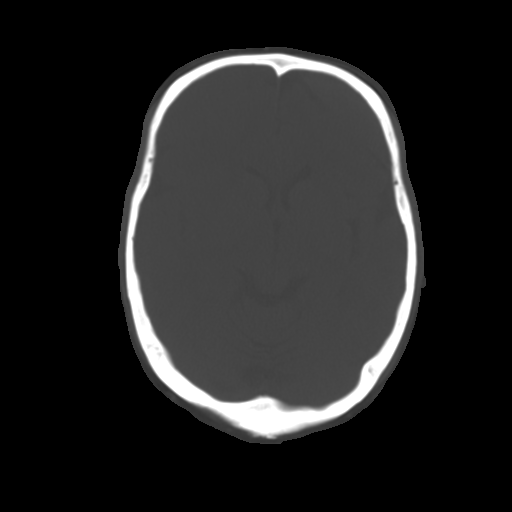
[im 14/32  brain]
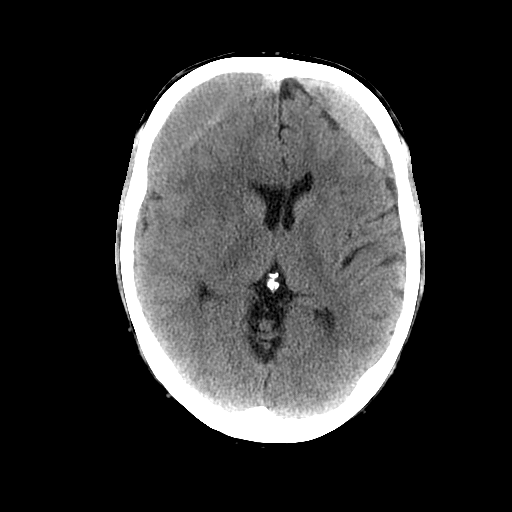
[im 16/32  brain]
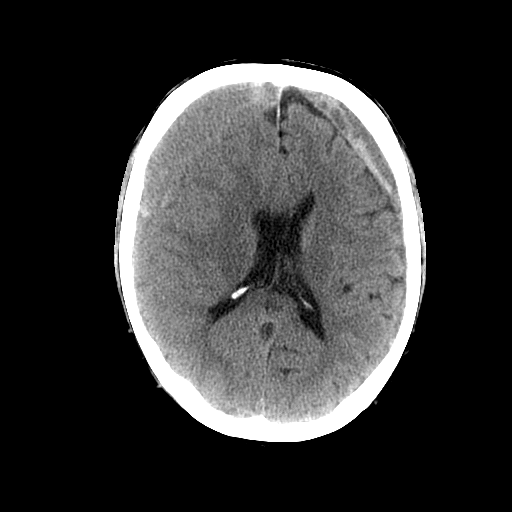
[im 18/32  brain]
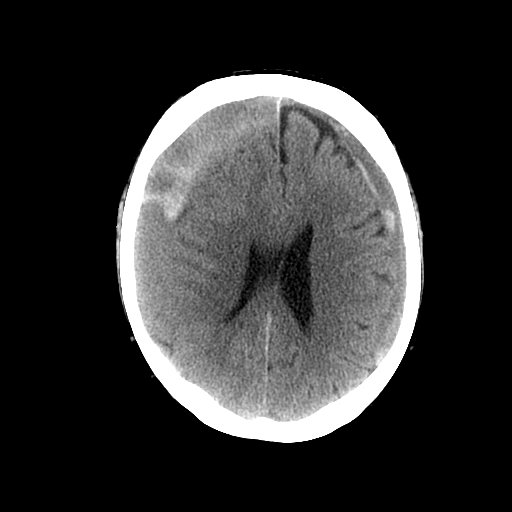
[im 20/32  brain]
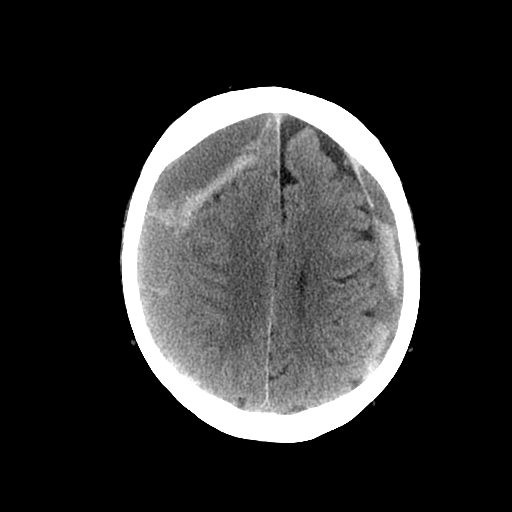
[im 20/32  bone]
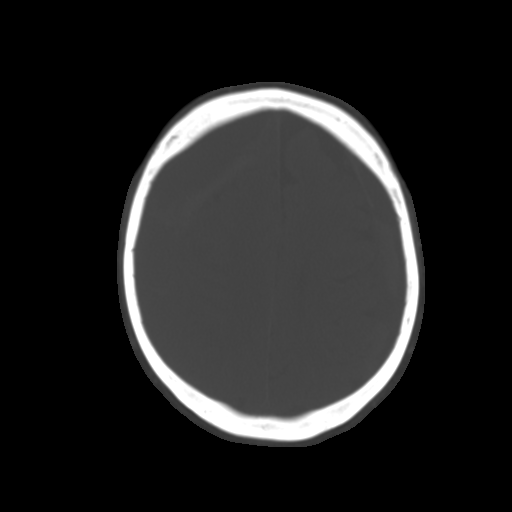
[im 23/32  brain]
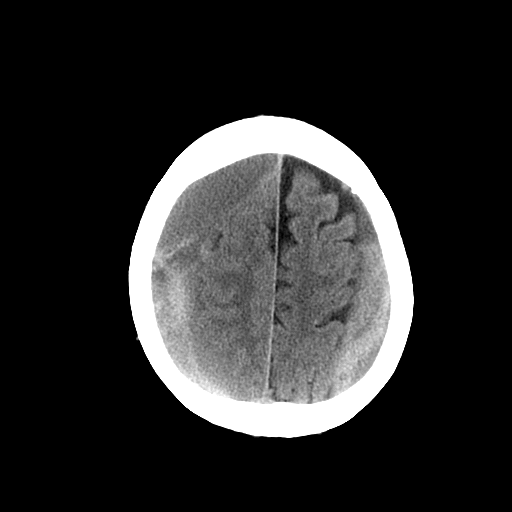
[im 25/32  brain]
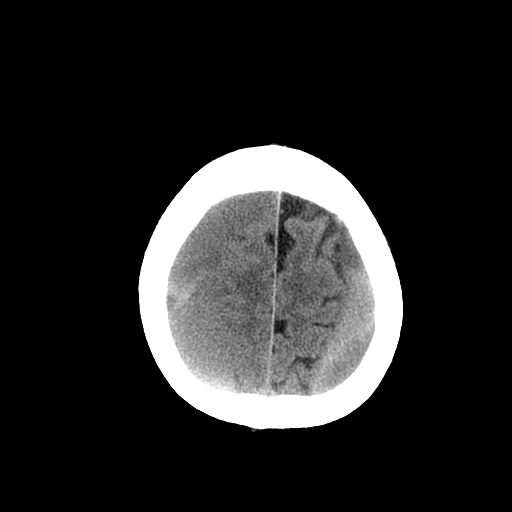
[im 27/32  brain]
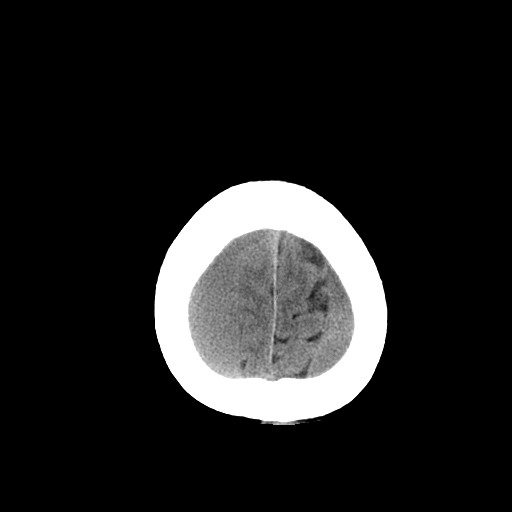
[im 29/32  brain]
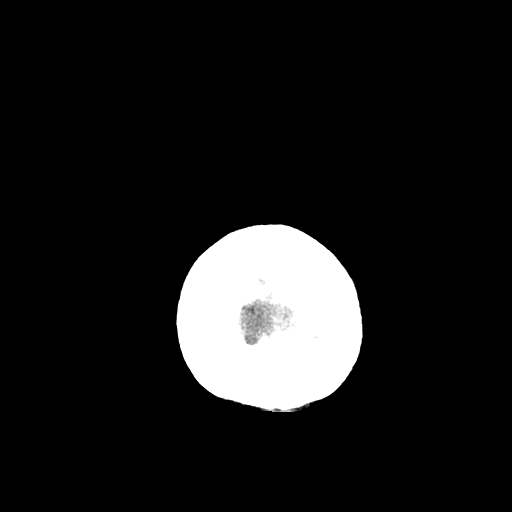
[im 29/32  bone]
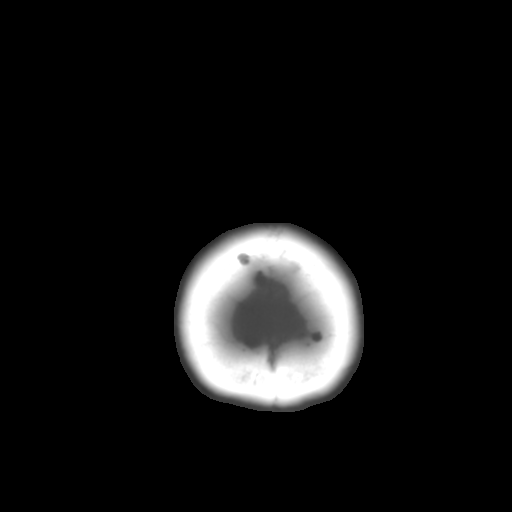

[15 of 30 positions shown; findings below may reference images not displayed]

FINDINGS: There are subdural hematomas bilaterally which show evidence of both
acute and chronic fluid. The larger subdural hematoma is present on
the right causing mass effect on the right frontal lobe. This
subdural hematoma measures 2.5 cm in maximum thickness. There is
moderate mass effect from the left-sided subdural hematoma with a
maximum thickness of 1.9 cm superiorly. There is more localized mass
effect on the left frontal lobe more anteriorly with a maximum
thickness of the subdural hematoma in this area of 1.5 cm. There is
midline shift toward the left of 5 mm. There is no intra-axial mass
or intra-axial hemorrhage. The gray-white compartments appear within
normal limits. No acute infarct apparent. The bony calvarium appears
intact. The mastoid air cells are clear.
IMPRESSION: Bilateral subdural hematomas causing mass effect. There is mixed
attenuation suggesting both acute and more chronic hemorrhage in
these areas. There is mild midline shift to the left. No acute
infarct apparent. No intra-axial hemorrhage.

Critical Value/emergent results were called by telephone at the time
of interpretation on 04/07/2014 at [DATE] to Dr. DIALWA HULELA , who
verbally acknowledged these results.

## 2019-02-01 ENCOUNTER — Ambulatory Visit: Payer: Medicare Other

## 2019-02-03 ENCOUNTER — Ambulatory Visit: Payer: Medicare Other | Attending: Internal Medicine

## 2019-02-03 DIAGNOSIS — Z23 Encounter for immunization: Secondary | ICD-10-CM

## 2019-02-03 NOTE — Progress Notes (Signed)
   Covid-19 Vaccination Clinic  Name:  David Mendoza    MRN: [id number] DOB: 1940-10-08  02/03/2019  [name] was observed post Covid-19 immunization for 15 minutes without incidence. He was provided with Vaccine Information Sheet and instruction to access the V-Safe system.   [name] was instructed to call 911 with any severe reactions post vaccine: Marland Kitchen Difficulty breathing  . Swelling of your face and throat  . A fast heartbeat  . A bad rash all over your body  . Dizziness and weakness    Immunizations Administered    Name Date Dose VIS Date Route   Pfizer COVID-19 Vaccine 02/03/2019 12:37 PM 0.3 mL 12/20/2018 Intramuscular   Manufacturer: [place]   Lot: [id number]   [place]: [id number]

## 2019-02-24 ENCOUNTER — Ambulatory Visit: Payer: Medicare Other | Attending: Internal Medicine

## 2019-02-24 DIAGNOSIS — Z23 Encounter for immunization: Secondary | ICD-10-CM | POA: Insufficient documentation

## 2019-02-24 NOTE — Progress Notes (Signed)
   Covid-19 Vaccination Clinic  Name:  David Mendoza    MRN: [id number] DOB: [birth year]  02/24/2019  [name] was observed post Covid-19 immunization for 15 minutes without incidence. He was provided with Vaccine Information Sheet and instruction to access the V-Safe system.   [name] was instructed to call 911 with any severe reactions post vaccine: Marland Kitchen Difficulty breathing  . Swelling of your face and throat  . A fast heartbeat  . A bad rash all over your body  . Dizziness and weakness    Immunizations Administered    Name Date Dose VIS Date Route   Pfizer COVID-19 Vaccine 02/24/2019 10:44 AM 0.3 mL 12/20/2018 Intramuscular   Manufacturer: [place]   Lot: [id number]   [place]: [id number]

## 2019-09-23 ENCOUNTER — Ambulatory Visit: Payer: Self-pay | Attending: Internal Medicine

## 2019-09-23 DIAGNOSIS — Z23 Encounter for immunization: Secondary | ICD-10-CM

## 2019-12-25 ENCOUNTER — Encounter (INDEPENDENT_AMBULATORY_CARE_PROVIDER_SITE_OTHER): Payer: Self-pay | Admitting: Ophthalmology

## 2019-12-25 ENCOUNTER — Ambulatory Visit (INDEPENDENT_AMBULATORY_CARE_PROVIDER_SITE_OTHER): Payer: Medicare Other | Admitting: Ophthalmology

## 2019-12-25 ENCOUNTER — Other Ambulatory Visit: Payer: Self-pay

## 2019-12-25 DIAGNOSIS — H35353 Cystoid macular degeneration, bilateral: Secondary | ICD-10-CM | POA: Insufficient documentation

## 2019-12-25 DIAGNOSIS — H2511 Age-related nuclear cataract, right eye: Secondary | ICD-10-CM

## 2019-12-25 DIAGNOSIS — H35373 Puckering of macula, bilateral: Secondary | ICD-10-CM

## 2019-12-25 DIAGNOSIS — D3131 Benign neoplasm of right choroid: Secondary | ICD-10-CM | POA: Diagnosis not present

## 2019-12-25 DIAGNOSIS — E113311 Type 2 diabetes mellitus with moderate nonproliferative diabetic retinopathy with macular edema, right eye: Secondary | ICD-10-CM | POA: Insufficient documentation

## 2019-12-25 DIAGNOSIS — H43811 Vitreous degeneration, right eye: Secondary | ICD-10-CM

## 2019-12-25 DIAGNOSIS — H2512 Age-related nuclear cataract, left eye: Secondary | ICD-10-CM

## 2019-12-25 DIAGNOSIS — H43812 Vitreous degeneration, left eye: Secondary | ICD-10-CM

## 2019-12-25 DIAGNOSIS — H35371 Puckering of macula, right eye: Secondary | ICD-10-CM | POA: Insufficient documentation

## 2019-12-25 DIAGNOSIS — E113313 Type 2 diabetes mellitus with moderate nonproliferative diabetic retinopathy with macular edema, bilateral: Secondary | ICD-10-CM

## 2019-12-25 HISTORY — DX: Age-related nuclear cataract, left eye: H25.12

## 2019-12-25 HISTORY — DX: Vitreous degeneration, left eye: H43.812

## 2019-12-25 HISTORY — DX: Age-related nuclear cataract, right eye: H25.11

## 2019-12-25 NOTE — Assessment & Plan Note (Signed)
OD with severe epiretinal membrane apparent nonetheless we need to determine whether CSME is playing some component of the secondary CME seen on OCT and clinically

## 2019-12-25 NOTE — Assessment & Plan Note (Signed)
Patient informed of the presence of bilateral clinically significant macular edema or CME which could be secondary to epiretinal membrane.  Clinically I favor the former, diabetic related macular edema.  We will nonetheless need confirmation via fluorescein angiography [facility]U and for this I have asked that he bring a driver

## 2019-12-25 NOTE — Progress Notes (Signed)
12/25/2019     CHIEF COMPLAINT Patient presents for Diabetic Eye Exam   HISTORY OF PRESENT ILLNESS: David Mendoza is a 79 y.o. male who presents to the clinic today for:   HPI    Diabetic Eye Exam    Vision is blurred for near and is blurred for distance.  Associated Symptoms Floaters.  Diabetes characteristics include Type 2 and taking oral medications.  Blood sugar level is controlled.  Last A1C 7.  I, the attending physician,  performed the HPI with the patient and updated documentation appropriately.          Comments    5 Year Diabetic Exam. Pt referred by Dr. Inda Merlin  Pt c/o OD vision being very hazy. Pt states he sees floaters OD>OS. Pt also sees a spiderweb floating around in OD vision. BGL: does not check daily       Last edited by Tilda Franco on 12/25/2019  8:42 AM. (History)      Referring physician: Josetta Huddle, MD [address],  [place] [address]  HISTORICAL INFORMATION:   Selected notes from the [place]: No current outpatient medications on file. (Ophthalmic Drugs)   No current facility-administered medications for this visit. (Ophthalmic Drugs)   Current Outpatient Medications (Other)  Medication Sig   amLODipine (NORVASC) 5 MG tablet Take 5 mg by mouth daily.   glipiZIDE (GLUCOTROL) 5 MG tablet Take 5 mg by mouth 2 (two) times daily.   hydrochlorothiazide (HYDRODIURIL) 12.5 MG tablet Take 12.5 mg by mouth daily.   losartan (COZAAR) 100 MG tablet Take 100 mg by mouth daily.   metFORMIN (GLUCOPHAGE) 500 MG tablet Take 1,000 mg by mouth 2 (two) times daily with a meal.   omeprazole (PRILOSEC) 40 MG capsule Take 40 mg by mouth daily.   rosuvastatin (CRESTOR) 20 MG tablet Take 20 mg by mouth daily.   No current facility-administered medications for this visit. (Other)      REVIEW OF SYSTEMS:    ALLERGIES Allergies  Allergen Reactions   Sulfa Antibiotics Other (See  Comments)    Doesn't remember     PAST MEDICAL HISTORY Past Medical History:  Diagnosis Date   Diabetes mellitus without complication ([place])    DVT (deep venous thrombosis) ([place])    Hyperlipemia    Hypertension    Peripheral neuropathy    Sciatica    Past Surgical History:  Procedure Laterality Date   CRANIOTOMY Bilateral 04/07/2014   Procedure: BILATERAL CRANIOTOMY HEMATOMA EVACUATION SUBDURAL;  Surgeon: Karie Chimera, MD;  Location: [facility];  Service: Neurosurgery;  Laterality: Bilateral;    FAMILY HISTORY History reviewed. No pertinent family history.  SOCIAL HISTORY Social History   Tobacco Use   Smoking status: Never Smoker   Smokeless tobacco: Never Used         OPHTHALMIC EXAM:  Base Eye Exam    Visual Acuity (Snellen - Linear)      Right Left   Dist [place] 20/100 -2 20/40   Dist ph [place] 20/80 -1 NI       Tonometry (Tonopen, 8:48 AM)      Right Left   Pressure 6 7       Pupils      Pupils Dark Light Shape React APD   Right PERRL 3 3 Round Minimal None   Left PERRL 3 3 Round Minimal None       Visual Fields (  Counting fingers)      Left Right    Full Full       Neuro/Psych    Oriented x3: Yes   Mood/Affect: Normal       Dilation    Both eyes: 1.0% Mydriacyl, 2.5% Phenylephrine @ 8:48 AM        Slit Lamp and Fundus Exam    External Exam      Right Left   External Normal Normal       Slit Lamp Exam      Right Left   Lids/Lashes Normal Normal   Conjunctiva/Sclera White and quiet White and quiet   Cornea Clear Clear   Anterior Chamber Deep and quiet Deep and quiet   Iris Round and reactive Round and reactive   Lens 2+ Nuclear sclerosis 2+ Nuclear sclerosis   Anterior Vitreous Normal Normal       Fundus Exam      Right Left   Posterior Vitreous Posterior vitreous detachment Posterior vitreous detachment   Disc Normal Normal   C/D Ratio 0.1 0.1   Macula Epiretinal membrane, Microaneurysms, Macular thickening, Cystoid  macular edema Microaneurysms, epiretinal membrane not detectable clinically OS   Vessels NPDR- Moderate NPDR- Moderate   Periphery Normal Good retinopexy at 2, 2:30          IMAGING AND PROCEDURES  Imaging and Procedures for 12/25/19  OCT, Retina - OU - Both Eyes       Right Eye Quality was good. Scan locations included subfoveal. Central Foveal Thickness: 483. Progression has worsened. Findings include cystoid macular edema, epiretinal membrane.   Left Eye Quality was good. Scan locations included subfoveal. Central Foveal Thickness: 385. Progression has worsened. Findings include cystoid macular edema, epiretinal membrane.   Notes Bilateral cystoid macular edema, each with an epiretinal membrane yet could be clinically significant macular edema with center involvement of the fovea                ASSESSMENT/PLAN:  Diabetic macular edema of both eyes with moderate nonproliferative retinopathy associated with type 2 diabetes mellitus [facility]) Patient informed of the presence of bilateral clinically significant macular edema or CME which could be secondary to epiretinal membrane.  Clinically I favor the former, diabetic related macular edema.  We will nonetheless need confirmation via fluorescein angiography OU and for this I have asked that he bring a driver  Bilateral epiretinal membrane OD with severe epiretinal membrane apparent nonetheless we need to determine whether CSME is playing some component of the secondary CME seen on OCT and clinically      ICD-10-CM   1. Cystoid macular edema of both eyes  H35.353 OCT, Retina - OU - Both Eyes  2. Diabetic macular edema of both eyes with moderate nonproliferative retinopathy associated with type 2 diabetes mellitus ([place])  O70.9628   3. Bilateral epiretinal membrane  H35.373 OCT, Retina - OU - Both Eyes  4. Choroidal nevus of right eye  D31.31   5. Posterior vitreous detachment of left eye  H43.812   6. Posterior vitreous  detachment of right eye  H43.811   7. Nuclear sclerotic cataract of right eye  H25.11   8. Nuclear sclerotic cataract of left eye  H25.12     1.  Patient informed of the presence of bilateral clinically significant macular edema or CME which could be secondary to epiretinal membrane.  Clinically I favor the former, diabetic related macular edema.  We will nonetheless need confirmation via fluorescein angiography OU and  for this I have asked that he bring a driver  2.  Return visit within a week for dilated examination OU, fluorescein angiography OU discussion of the modalities of therapy which might include medical therapy for diabetic CSME or potentially could require intervention via surgery ultimately.  3.  I will mention the patient he also has cataracts in each eye which are some component of his visual difficulty but not the major component  Ophthalmic Meds Ordered this visit:  No orders of the defined types were placed in this encounter.      Return in about 1 week (around 01/01/2020) for DILATE OU, OPTOS FFA R/L, COLOR FP, AVASTIN OCT, OD.  There are no Patient Instructions on file for this visit.   Explained the diagnoses, plan, and follow up with the patient and they expressed understanding.  Patient expressed understanding of the importance of proper follow up care.   Clent Demark Eddrick Dilone M.D. Diseases & Surgery of the Retina and Vitreous Retina & Diabetic [place] 12/25/19     Abbreviations: M myopia (nearsighted); A astigmatism; H hyperopia (farsighted); P presbyopia; Mrx spectacle prescription;  CTL contact lenses; OD right eye; OS left eye; OU both eyes  XT exotropia; ET esotropia; PEK punctate epithelial keratitis; PEE punctate epithelial erosions; DES dry eye syndrome; MGD meibomian gland dysfunction; ATs artificial tears; PFAT's preservative free artificial tears; [place] nuclear sclerotic cataract; PSC posterior subcapsular cataract; ERM epi-retinal membrane; PVD  posterior vitreous detachment; RD retinal detachment; DM diabetes mellitus; DR diabetic retinopathy; NPDR non-proliferative diabetic retinopathy; PDR proliferative diabetic retinopathy; CSME clinically significant macular edema; DME diabetic macular edema; dbh dot blot hemorrhages; CWS cotton wool spot; POAG primary open angle glaucoma; C/D cup-to-disc ratio; HVF humphrey visual field; GVF goldmann visual field; OCT optical coherence tomography; IOP intraocular pressure; BRVO Branch retinal vein occlusion; CRVO central retinal vein occlusion; CRAO central retinal artery occlusion; BRAO branch retinal artery occlusion; RT retinal tear; SB scleral buckle; PPV pars plana vitrectomy; VH Vitreous hemorrhage; PRP panretinal laser photocoagulation; IVK intravitreal kenalog; VMT vitreomacular traction; MH Macular hole;  NVD neovascularization of the disc; NVE neovascularization elsewhere; AREDS age related eye disease study; ARMD age related macular degeneration; POAG primary open angle glaucoma; EBMD epithelial/anterior basement membrane dystrophy; ACIOL anterior chamber intraocular lens; IOL intraocular lens; PCIOL posterior chamber intraocular lens; Phaco/IOL phacoemulsification with intraocular lens placement; [place] photorefractive keratectomy; LASIK laser assisted in situ keratomileusis; HTN hypertension; DM diabetes mellitus; COPD chronic obstructive pulmonary disease

## 2019-12-31 ENCOUNTER — Encounter (INDEPENDENT_AMBULATORY_CARE_PROVIDER_SITE_OTHER): Payer: Self-pay | Admitting: Ophthalmology

## 2019-12-31 ENCOUNTER — Other Ambulatory Visit: Payer: Self-pay

## 2019-12-31 ENCOUNTER — Ambulatory Visit (INDEPENDENT_AMBULATORY_CARE_PROVIDER_SITE_OTHER): Payer: Medicare Other | Admitting: Ophthalmology

## 2019-12-31 DIAGNOSIS — H35353 Cystoid macular degeneration, bilateral: Secondary | ICD-10-CM | POA: Diagnosis not present

## 2019-12-31 DIAGNOSIS — E113311 Type 2 diabetes mellitus with moderate nonproliferative diabetic retinopathy with macular edema, right eye: Secondary | ICD-10-CM | POA: Diagnosis not present

## 2019-12-31 DIAGNOSIS — H35373 Puckering of macula, bilateral: Secondary | ICD-10-CM

## 2019-12-31 DIAGNOSIS — D3131 Benign neoplasm of right choroid: Secondary | ICD-10-CM

## 2019-12-31 MED ORDER — BEVACIZUMAB 2.5 MG/0.1ML IZ SOSY
2.5000 mg | PREFILLED_SYRINGE | INTRAVITREAL | Status: AC | PRN
  Administered 2019-12-31: 2.5 mg via INTRAVITREAL

## 2019-12-31 NOTE — Assessment & Plan Note (Signed)
Stable over time, no high risk features

## 2019-12-31 NOTE — Progress Notes (Signed)
12/31/2019     CHIEF COMPLAINT Patient presents for Retina Follow Up (1 Week FFA/FP R/L, poss Avastin OD//Pt denies noticeable changes to [place] OU since last visit. Pt denies ocular pain, flashes of light, or floaters OU. //LBS: did not check)   HISTORY OF PRESENT ILLNESS: David Mendoza is a 79 y.o. male who presents to the clinic today for:   HPI    Retina Follow Up    Patient presents with  Other.  In both eyes.  This started 1 week ago.  Severity is mild.  Duration of 1 week.  Since onset it is stable. Additional comments: 1 Week FFA/FP R/L, poss Avastin OD  Pt denies noticeable changes to [place] OU since last visit. Pt denies ocular pain, flashes of light, or floaters OU.   LBS: did not check       Last edited by Ileana Roup, COA on 12/31/2019  8:06 AM. (History)      Referring physician: Marden Noble, MD [address] [place],  [place] [address]  HISTORICAL INFORMATION:   Selected notes from the MEDICAL RECORD NUMBER       CURRENT MEDICATIONS: No current outpatient medications on file. (Ophthalmic Drugs)   No current facility-administered medications for this visit. (Ophthalmic Drugs)   Current Outpatient Medications (Other)  Medication Sig  . amLODipine (NORVASC) 5 MG tablet Take 5 mg by mouth daily.  Mar[place] Kitchen glipiZIDE (GLUCOTROL) 5 MG tablet Take 5 mg by mouth 2 (two) times daily.  . hydrochlorothiazide (HYDRODIURIL) 12.5 MG tablet Take 12.5 mg by mouth daily.  Marland Kitchen losartan (COZAAR) 100 MG tablet Take 100 mg by mouth daily.  . metFORMIN (GLUCOPHAGE) 500 MG tablet Take 1,000 mg by mouth 2 (two) times daily with a meal.  . omeprazole (PRILOSEC) 40 MG capsule Take 40 mg by mouth daily.  . rosuvastatin (CRESTOR) 20 MG tablet Take 20 mg by mouth daily.   No current facility-administered medications for this visit. (Other)      REVIEW OF SYSTEMS:    ALLERGIES Allergies  Allergen Reactions  . Sulfa Antibiotics Other (See Comments)    Doesn't remember      PAST MEDICAL HISTORY Past Medical History:  Diagnosis Date  . Diabetes mellitus without complication ([facility])   . DVT (deep venous thrombosis) ([facility])   . Hyperlipemia   . Hypertension   . Peripheral neuropathy   . Sciatica    Past Surgical History:  Procedure Laterality Date  . CRANIOTOMY Bilateral 04/07/2014   Procedure: BILATERAL CRANIOTOMY HEMATOMA EVACUATION SUBDURAL;  Surgeon: Aliene Beams, MD;  Location: [facility];  Service: Neurosurgery;  Laterality: Bilateral;    FAMILY HISTORY History reviewed. No pertinent family history.  SOCIAL HISTORY Social History   Tobacco Use  . Smoking status: Never Smoker  . Smokeless tobacco: Never Used         OPHTHALMIC EXAM:  Base Eye Exam    Visual Acuity (ETDRS)      Right Left   Dist [place] 20/80 +1 20/50 +2   Dist ph  20/70 +1 NI       Tonometry (Tonopen, 8:06 AM)      Right Left   Pressure 14 18       Pupils      Pupils Dark Light Shape React APD   Right PERRL 3 3 Round Minimal None   Left PERRL 3 3 Round Minimal None       Visual Fields (Counting fingers)  Left Right    Full Full       Extraocular Movement      Right Left    Full Full       Neuro/Psych    Oriented x3: Yes   Mood/Affect: Normal       Dilation    Both eyes: 1.0% Mydriacyl, 2.5% Phenylephrine @ 8:09 AM        Slit Lamp and Fundus Exam    External Exam      Right Left   External Normal Normal       Slit Lamp Exam      Right Left   Lids/Lashes Normal Normal   Conjunctiva/Sclera White and quiet White and quiet   Cornea Clear Clear   Anterior Chamber Deep and quiet Deep and quiet   Iris Round and reactive Round and reactive   Lens 2+ Nuclear sclerosis 2+ Nuclear sclerosis   Anterior Vitreous Normal Normal       Fundus Exam      Right Left   Posterior Vitreous Posterior vitreous detachment Posterior vitreous detachment   Disc Normal Normal   C/D Ratio 0.1 0.1   Macula Epiretinal membrane, Microaneurysms, Macular  thickening, Cystoid macular edema Microaneurysms, epiretinal membrane not detectable clinically OS   Vessels NPDR- Moderate NPDR- Moderate   Periphery Normal Good retinopexy at 2, 2:30          IMAGING AND PROCEDURES  Imaging and Procedures for 12/31/19  Color Fundus Photography Optos - OU - Both Eyes       Right Eye Progression has no prior data. Disc findings include normal observations. Macula : edema, microaneurysms, epiretinal membrane. Periphery : tear.   Left Eye Progression has no prior data. Disc findings include normal observations. Macula : microaneurysms. Periphery : tear.   Notes Old retinal tear superotemporal, with chorioretinal scarring 316, no retinal detachment OD, small choroidal nevus incidental in the posterior pole proximally 2.5 disc damage by to this diameters flat with no high risk characteristics, no lipofuscin, no atrophy, no fluid, no vascularity no atrophy  OS, old retinal tear superotemporal, good retinopexy       Fluorescein Angiography Optos (Transit OD)       Poor iv access, not able to perform       Intravitreal Injection, Pharmacologic Agent - OD - Right Eye       Time Out 12/31/2019. 9:02 AM. Confirmed correct patient, procedure, site, and patient consented.   Anesthesia Topical anesthesia was used. Anesthetic medications included [place] 3.5%.   Procedure Preparation included Ofloxacin , 10% betadine to eyelids, 5% betadine to ocular surface. A 30 gauge needle was used.   Injection:  2.5 mg Bevacizumab (AVASTIN) 2.5mg /0.75mL SOSY   NDC: 27062-376-28, Lot: [id number]   Route: Intravitreal, Site: Right Eye  Post-op Post injection exam found visual acuity of at least counting fingers. The patient tolerated the procedure well. There were no complications. The patient received written and verbal post procedure care education. Post injection medications were not given.                 ASSESSMENT/PLAN:  Choroidal nevus of  right eye Stable over time, no high risk features  Bilateral epiretinal membrane OD likely with some impact on acuity, will know more about this once the macular edema hopefully improves post intravitreal Avastin      ICD-10-CM   1. Diabetic maculopathy of right eye with moderate nonproliferative retinopathy and macular edema determined by examination associated with type 2  diabetes mellitus (HCC)  D66.4403 Intravitreal Injection, Pharmacologic Agent - OD - Right Eye    bevacizumab (AVASTIN) SOSY 2.5 mg  2. Cystoid macular edema of both eyes  H35.353 Color Fundus Photography Optos - OU - Both Eyes    Fluorescein Angiography Optos (Transit OD)    CANCELED: OCT, Retina - OU - Both Eyes  3. Choroidal nevus of right eye  D31.31   4. Bilateral epiretinal membrane  H35.373     1.  Fluorescein angiography attempted yet with poor IV access not successful.  One attempt in the left biceps region as well as in the left hand followed thereafter by right forearm not successful as the thin-walled veins were disrupted  2.  Moderate nonproliferative diabetic retinopathy with likely CSME OD and epiretinal membrane.  Will attempt therapy today with intravitreal Avastin to resolve the macular edema.  Follow-up in 5 weeks to assess the result  3.  Plan will be to attempt treatment of the right eye to look for  Susceptibility to intravitreal antivegF right eye.  If CME CSME OD does improve may consider therapy left eye as well.   Ophthalmic Meds Ordered this visit:  Meds ordered this encounter  Medications  . bevacizumab (AVASTIN) SOSY 2.5 mg       Return in about 5 weeks (around 02/04/2020) for dilate, OD, AVASTIN OCT.  There are no Patient Instructions on file for this visit.   Explained the diagnoses, plan, and follow up with the patient and they expressed understanding.  Patient expressed understanding of the importance of proper follow up care.   Clent Demark Marshall Roehrich M.D. Diseases & Surgery of  the Retina and Vitreous Retina & Diabetic [place] 12/31/19     Abbreviations: M myopia (nearsighted); A astigmatism; H hyperopia (farsighted); P presbyopia; Mrx spectacle prescription;  CTL contact lenses; OD right eye; OS left eye; OU both eyes  XT exotropia; ET esotropia; PEK punctate epithelial keratitis; PEE punctate epithelial erosions; DES dry eye syndrome; MGD meibomian gland dysfunction; ATs artificial tears; PFAT's preservative free artificial tears; [place] nuclear sclerotic cataract; PSC posterior subcapsular cataract; ERM epi-retinal membrane; PVD posterior vitreous detachment; RD retinal detachment; DM diabetes mellitus; DR diabetic retinopathy; NPDR non-proliferative diabetic retinopathy; PDR proliferative diabetic retinopathy; CSME clinically significant macular edema; DME diabetic macular edema; dbh dot blot hemorrhages; CWS cotton wool spot; POAG primary open angle glaucoma; C/D cup-to-disc ratio; HVF humphrey visual field; GVF goldmann visual field; OCT optical coherence tomography; IOP intraocular pressure; BRVO Branch retinal vein occlusion; CRVO central retinal vein occlusion; CRAO central retinal artery occlusion; BRAO branch retinal artery occlusion; RT retinal tear; SB scleral buckle; PPV pars plana vitrectomy; VH Vitreous hemorrhage; PRP panretinal laser photocoagulation; IVK intravitreal kenalog; VMT vitreomacular traction; MH Macular hole;  NVD neovascularization of the disc; NVE neovascularization elsewhere; AREDS age related eye disease study; ARMD age related macular degeneration; POAG primary open angle glaucoma; EBMD epithelial/anterior basement membrane dystrophy; ACIOL anterior chamber intraocular lens; IOL intraocular lens; PCIOL posterior chamber intraocular lens; Phaco/IOL phacoemulsification with intraocular lens placement; [place] photorefractive keratectomy; LASIK laser assisted in situ keratomileusis; HTN hypertension; DM diabetes mellitus; COPD chronic obstructive  pulmonary disease

## 2019-12-31 NOTE — Assessment & Plan Note (Signed)
OD likely with some impact on acuity, will know more about this once the macular edema hopefully improves post intravitreal Avastin

## 2020-02-02 DIAGNOSIS — E782 Mixed hyperlipidemia: Secondary | ICD-10-CM | POA: Diagnosis not present

## 2020-02-02 DIAGNOSIS — E1165 Type 2 diabetes mellitus with hyperglycemia: Secondary | ICD-10-CM | POA: Diagnosis not present

## 2020-02-02 DIAGNOSIS — I10 Essential (primary) hypertension: Secondary | ICD-10-CM | POA: Diagnosis not present

## 2020-02-02 DIAGNOSIS — K219 Gastro-esophageal reflux disease without esophagitis: Secondary | ICD-10-CM | POA: Diagnosis not present

## 2020-02-02 DIAGNOSIS — E1140 Type 2 diabetes mellitus with diabetic neuropathy, unspecified: Secondary | ICD-10-CM | POA: Diagnosis not present

## 2020-02-02 DIAGNOSIS — E1142 Type 2 diabetes mellitus with diabetic polyneuropathy: Secondary | ICD-10-CM | POA: Diagnosis not present

## 2020-02-04 ENCOUNTER — Other Ambulatory Visit: Payer: Self-pay

## 2020-02-04 ENCOUNTER — Encounter (INDEPENDENT_AMBULATORY_CARE_PROVIDER_SITE_OTHER): Payer: Self-pay | Admitting: Ophthalmology

## 2020-02-04 ENCOUNTER — Ambulatory Visit (INDEPENDENT_AMBULATORY_CARE_PROVIDER_SITE_OTHER): Payer: Medicare Other | Admitting: Ophthalmology

## 2020-02-04 DIAGNOSIS — E113311 Type 2 diabetes mellitus with moderate nonproliferative diabetic retinopathy with macular edema, right eye: Secondary | ICD-10-CM | POA: Diagnosis not present

## 2020-02-04 DIAGNOSIS — H2512 Age-related nuclear cataract, left eye: Secondary | ICD-10-CM | POA: Diagnosis not present

## 2020-02-04 DIAGNOSIS — H35373 Puckering of macula, bilateral: Secondary | ICD-10-CM | POA: Diagnosis not present

## 2020-02-04 DIAGNOSIS — E113312 Type 2 diabetes mellitus with moderate nonproliferative diabetic retinopathy with macular edema, left eye: Secondary | ICD-10-CM | POA: Diagnosis not present

## 2020-02-04 NOTE — Progress Notes (Addendum)
02/04/2020     CHIEF COMPLAINT Patient presents for Retina Follow Up (5 Week NPDR f\u OD. Possible Avastin OD. OCT/Pt states vision is stable. Denies new complaints./BGL: did not check)   HISTORY OF PRESENT ILLNESS: David Mendoza is a 80 y.o. male who presents to the clinic today for:   HPI    Retina Follow Up    Patient presents with  Diabetic Retinopathy.  In right eye.  Severity is moderate.  Duration of 5 weeks.  Since onset it is stable.  I, the attending physician,  performed the HPI with the patient and updated documentation appropriately. Additional comments: 5 Week NPDR f\u OD. Possible Avastin OD. OCT Pt states vision is stable. Denies new complaints. BGL: did not check       Last edited by Tilda Franco on 02/04/2020  8:17 AM. (History)      Referring physician: Josetta Huddle, MD [address],  [place] [address]  HISTORICAL INFORMATION:   Selected notes from the [facility]: No current outpatient medications on file. (Ophthalmic Drugs)   No current facility-administered medications for this visit. (Ophthalmic Drugs)   Current Outpatient Medications (Other)  Medication Sig  . amLODipine (NORVASC) 5 MG tablet Take 5 mg by mouth daily.  Marland Kitchen glipiZIDE (GLUCOTROL) 5 MG tablet Take 5 mg by mouth 2 (two) times daily.  . hydrochlorothiazide (HYDRODIURIL) 12.5 MG tablet Take 12.5 mg by mouth daily.  Marland Kitchen losartan (COZAAR) 100 MG tablet Take 100 mg by mouth daily.  . metFORMIN (GLUCOPHAGE) 500 MG tablet Take 1,000 mg by mouth 2 (two) times daily with a meal.  . omeprazole (PRILOSEC) 40 MG capsule Take 40 mg by mouth daily.  . rosuvastatin (CRESTOR) 20 MG tablet Take 20 mg by mouth daily.   No current facility-administered medications for this visit. (Other)      REVIEW OF SYSTEMS: ROS    Positive for: Endocrine   Last edited by Tilda Franco on 02/04/2020  8:17 AM. (History)        ALLERGIES Allergies  Allergen Reactions  . Sulfa Antibiotics Other (See Comments)    Doesn't remember     PAST MEDICAL HISTORY Past Medical History:  Diagnosis Date  . Diabetes mellitus without complication ([place])   . DVT (deep venous thrombosis) ([place])   . Hyperlipemia   . Hypertension   . Peripheral neuropathy   . Sciatica    Past Surgical History:  Procedure Laterality Date  . CRANIOTOMY Bilateral 04/07/2014   Procedure: BILATERAL CRANIOTOMY HEMATOMA EVACUATION SUBDURAL;  Surgeon: Karie Chimera, MD;  Location: [facility];  Service: Neurosurgery;  Laterality: Bilateral;    FAMILY HISTORY History reviewed. No pertinent family history.  SOCIAL HISTORY Social History   Tobacco Use  . Smoking status: Never Smoker  . Smokeless tobacco: Never Used         OPHTHALMIC EXAM:  Base Eye Exam    Visual Acuity (Snellen - Linear)      Right Left   Dist [place] 20/100 +2 20/40 -1   Dist ph [place] 20/40 -1 NI       Tonometry (Tonopen, 8:22 AM)      Right Left   Pressure 13 15       Pupils      Pupils Dark Light Shape React APD   Right PERRL 3 3 Round Minimal None   Left PERRL 3 3 Round Minimal None  Visual Fields (Counting fingers)      Left Right    Full Full       Neuro/Psych    Oriented x3: Yes   Mood/Affect: Normal       Dilation    Right eye: 1.0% Mydriacyl, 2.5% Phenylephrine @ 8:22 AM        Slit Lamp and Fundus Exam    External Exam      Right Left   External Normal Normal       Slit Lamp Exam      Right Left   Lids/Lashes Normal Normal   Conjunctiva/Sclera White and quiet White and quiet   Cornea Clear Clear   Anterior Chamber Deep and quiet Deep and quiet   Iris Round and reactive Round and reactive   Lens 2+ Nuclear sclerosis 2+ Nuclear sclerosis   Anterior Vitreous Normal Normal       Fundus Exam      Right Left   Posterior Vitreous Posterior vitreous detachment    Disc Normal    C/D Ratio 0.1    Macula Epiretinal  membrane, Microaneurysms,  Less Macular thickening, Cystoid macular edema    Vessels NPDR- Moderate    Periphery Normal           IMAGING AND PROCEDURES  Imaging and Procedures for 02/09/20  OCT, Retina - OU - Both Eyes       Right Eye Quality was good. Scan locations included subfoveal. Central Foveal Thickness: 403. Progression has improved. Findings include epiretinal membrane, cystoid macular edema.   Left Eye Quality was good. Scan locations included subfoveal. Central Foveal Thickness: 366. Progression has been stable. Findings include cystoid macular edema.   Notes OD, much improved CSME with less intraretinal fluid collection, injection intravitreal Avastin No. 1.  We will repeat injection today   OS continues with minor CSME centrally, media opacity as well       Intravitreal Injection, Pharmacologic Agent - OD - Right Eye       Time Out 02/04/2020. 9:00 AM. Confirmed correct patient, procedure, site, and patient consented.   Anesthesia Topical anesthesia was used. Anesthetic medications included Akten 3.5%.   Procedure Preparation included Ofloxacin , 10% betadine to eyelids, 5% betadine to ocular surface. A 30 gauge needle was used.   Injection:  2.5 mg Bevacizumab (AVASTIN) 2.5mg /0.75mL SOSY   NDC: 19509-326-71, Lot: [id number]   Route: Intravitreal, Site: Right Eye  Post-op Post injection exam found visual acuity of at least counting fingers. The patient tolerated the procedure well. There were no complications. The patient received written and verbal post procedure care education. Post injection medications were not given.                 ASSESSMENT/PLAN:  Bilateral epiretinal membrane OD, some impact on acuity with thickening yet CSME has improved post injection Avastin No. 1 thus most of the impact visually is from the diabetic maculopathy  Moderate nonproliferative diabetic retinopathy of right eye with macular edema (HCC) OD vastly  improved status post injection Avastin No. 1, will continue to observe OS with minor inner CSME.  Nuclear sclerotic cataract of left eye Likely accounts for acuity left eye see change in OCT due to media opacity      ICD-10-CM   1. Diabetic maculopathy of right eye with moderate nonproliferative retinopathy and macular edema determined by examination associated with type 2 diabetes mellitus (HCC)  E11.3311 OCT, Retina - OU - Both Eyes    Intravitreal Injection, Pharmacologic  Agent - OD - Right Eye    bevacizumab (AVASTIN) SOSY 2.5 mg  2. Bilateral epiretinal membrane  H35.373   3. Moderate nonproliferative diabetic retinopathy of right eye with macular edema associated with type 2 diabetes mellitus ([place])  E11.3311   4. Moderate nonproliferative diabetic retinopathy of left eye with macular edema associated with type 2 diabetes mellitus ([place])  Q03.4742   5. Nuclear sclerotic cataract of left eye  H25.12     1.  2.  3.  Ophthalmic Meds Ordered this visit:  Meds ordered this encounter  Medications  . bevacizumab (AVASTIN) SOSY 2.5 mg       Return in about 6 weeks (around 03/17/2020) for dilate, OD, AVASTIN OCT.  There are no Patient Instructions on file for this visit.   Explained the diagnoses, plan, and follow up with the patient and they expressed understanding.  Patient expressed understanding of the importance of proper follow up care.   Clent Demark Derrall Hicks M.D. Diseases & Surgery of the Retina and Vitreous Retina & Diabetic [place] 02/09/20     Abbreviations: M myopia (nearsighted); A astigmatism; H hyperopia (farsighted); P presbyopia; Mrx spectacle prescription;  CTL contact lenses; OD right eye; OS left eye; OU both eyes  XT exotropia; ET esotropia; PEK punctate epithelial keratitis; PEE punctate epithelial erosions; DES dry eye syndrome; MGD meibomian gland dysfunction; ATs artificial tears; PFAT's preservative free artificial tears; [place] nuclear sclerotic cataract;  PSC posterior subcapsular cataract; ERM epi-retinal membrane; PVD posterior vitreous detachment; RD retinal detachment; DM diabetes mellitus; DR diabetic retinopathy; NPDR non-proliferative diabetic retinopathy; PDR proliferative diabetic retinopathy; CSME clinically significant macular edema; DME diabetic macular edema; dbh dot blot hemorrhages; CWS cotton wool spot; POAG primary open angle glaucoma; C/D cup-to-disc ratio; HVF humphrey visual field; GVF goldmann visual field; OCT optical coherence tomography; IOP intraocular pressure; BRVO Branch retinal vein occlusion; CRVO central retinal vein occlusion; CRAO central retinal artery occlusion; BRAO branch retinal artery occlusion; RT retinal tear; SB scleral buckle; PPV pars plana vitrectomy; VH Vitreous hemorrhage; PRP panretinal laser photocoagulation; IVK intravitreal kenalog; VMT vitreomacular traction; MH Macular hole;  NVD neovascularization of the disc; NVE neovascularization elsewhere; AREDS age related eye disease study; ARMD age related macular degeneration; POAG primary open angle glaucoma; EBMD epithelial/anterior basement membrane dystrophy; ACIOL anterior chamber intraocular lens; IOL intraocular lens; PCIOL posterior chamber intraocular lens; Phaco/IOL phacoemulsification with intraocular lens placement; [place] photorefractive keratectomy; LASIK laser assisted in situ keratomileusis; HTN hypertension; DM diabetes mellitus; COPD chronic obstructive pulmonary disease

## 2020-02-04 NOTE — Assessment & Plan Note (Signed)
OD vastly improved status post injection Avastin No. 1, will continue to observe OS with minor inner CSME.

## 2020-02-04 NOTE — Assessment & Plan Note (Signed)
Likely accounts for acuity left eye see change in OCT due to media opacity

## 2020-02-04 NOTE — Assessment & Plan Note (Signed)
OD, some impact on acuity with thickening yet CSME has improved post injection Avastin No. 1 thus most of the impact visually is from the diabetic maculopathy

## 2020-02-09 DIAGNOSIS — E113311 Type 2 diabetes mellitus with moderate nonproliferative diabetic retinopathy with macular edema, right eye: Secondary | ICD-10-CM | POA: Diagnosis not present

## 2020-02-09 MED ORDER — BEVACIZUMAB 2.5 MG/0.1ML IZ SOSY
2.5000 mg | PREFILLED_SYRINGE | INTRAVITREAL | Status: AC | PRN
  Administered 2020-02-09: 2.5 mg via INTRAVITREAL

## 2020-02-25 DIAGNOSIS — E1165 Type 2 diabetes mellitus with hyperglycemia: Secondary | ICD-10-CM | POA: Diagnosis not present

## 2020-02-25 DIAGNOSIS — I10 Essential (primary) hypertension: Secondary | ICD-10-CM | POA: Diagnosis not present

## 2020-02-25 DIAGNOSIS — K219 Gastro-esophageal reflux disease without esophagitis: Secondary | ICD-10-CM | POA: Diagnosis not present

## 2020-02-25 DIAGNOSIS — E11319 Type 2 diabetes mellitus with unspecified diabetic retinopathy without macular edema: Secondary | ICD-10-CM | POA: Diagnosis not present

## 2020-02-25 DIAGNOSIS — E1140 Type 2 diabetes mellitus with diabetic neuropathy, unspecified: Secondary | ICD-10-CM | POA: Diagnosis not present

## 2020-02-25 DIAGNOSIS — E1121 Type 2 diabetes mellitus with diabetic nephropathy: Secondary | ICD-10-CM | POA: Diagnosis not present

## 2020-02-25 DIAGNOSIS — E1142 Type 2 diabetes mellitus with diabetic polyneuropathy: Secondary | ICD-10-CM | POA: Diagnosis not present

## 2020-02-25 DIAGNOSIS — E782 Mixed hyperlipidemia: Secondary | ICD-10-CM | POA: Diagnosis not present

## 2020-03-01 DIAGNOSIS — H0102A Squamous blepharitis right eye, upper and lower eyelids: Secondary | ICD-10-CM | POA: Diagnosis not present

## 2020-03-01 DIAGNOSIS — H0102B Squamous blepharitis left eye, upper and lower eyelids: Secondary | ICD-10-CM | POA: Diagnosis not present

## 2020-03-01 DIAGNOSIS — H35373 Puckering of macula, bilateral: Secondary | ICD-10-CM | POA: Diagnosis not present

## 2020-03-01 DIAGNOSIS — H2513 Age-related nuclear cataract, bilateral: Secondary | ICD-10-CM | POA: Diagnosis not present

## 2020-03-01 DIAGNOSIS — E113393 Type 2 diabetes mellitus with moderate nonproliferative diabetic retinopathy without macular edema, bilateral: Secondary | ICD-10-CM | POA: Diagnosis not present

## 2020-03-01 DIAGNOSIS — H04123 Dry eye syndrome of bilateral lacrimal glands: Secondary | ICD-10-CM | POA: Diagnosis not present

## 2020-03-01 DIAGNOSIS — Z9889 Other specified postprocedural states: Secondary | ICD-10-CM | POA: Diagnosis not present

## 2020-03-01 DIAGNOSIS — D3131 Benign neoplasm of right choroid: Secondary | ICD-10-CM | POA: Diagnosis not present

## 2020-03-08 ENCOUNTER — Encounter (INDEPENDENT_AMBULATORY_CARE_PROVIDER_SITE_OTHER): Payer: Self-pay

## 2020-03-17 ENCOUNTER — Other Ambulatory Visit: Payer: Self-pay

## 2020-03-17 ENCOUNTER — Encounter (INDEPENDENT_AMBULATORY_CARE_PROVIDER_SITE_OTHER): Payer: Medicare Other | Admitting: Ophthalmology

## 2020-03-17 ENCOUNTER — Encounter (INDEPENDENT_AMBULATORY_CARE_PROVIDER_SITE_OTHER): Payer: Self-pay

## 2020-03-17 DIAGNOSIS — E782 Mixed hyperlipidemia: Secondary | ICD-10-CM | POA: Diagnosis not present

## 2020-03-17 DIAGNOSIS — K219 Gastro-esophageal reflux disease without esophagitis: Secondary | ICD-10-CM | POA: Diagnosis not present

## 2020-03-17 DIAGNOSIS — E11319 Type 2 diabetes mellitus with unspecified diabetic retinopathy without macular edema: Secondary | ICD-10-CM | POA: Diagnosis not present

## 2020-03-17 DIAGNOSIS — E1165 Type 2 diabetes mellitus with hyperglycemia: Secondary | ICD-10-CM | POA: Diagnosis not present

## 2020-03-17 DIAGNOSIS — I10 Essential (primary) hypertension: Secondary | ICD-10-CM | POA: Diagnosis not present

## 2020-03-17 DIAGNOSIS — E1140 Type 2 diabetes mellitus with diabetic neuropathy, unspecified: Secondary | ICD-10-CM | POA: Diagnosis not present

## 2020-03-17 DIAGNOSIS — E1121 Type 2 diabetes mellitus with diabetic nephropathy: Secondary | ICD-10-CM | POA: Diagnosis not present

## 2020-03-17 DIAGNOSIS — E1142 Type 2 diabetes mellitus with diabetic polyneuropathy: Secondary | ICD-10-CM | POA: Diagnosis not present

## 2020-03-24 ENCOUNTER — Other Ambulatory Visit: Payer: Self-pay

## 2020-03-24 ENCOUNTER — Ambulatory Visit (INDEPENDENT_AMBULATORY_CARE_PROVIDER_SITE_OTHER): Payer: Medicare Other | Admitting: Ophthalmology

## 2020-03-24 ENCOUNTER — Encounter (INDEPENDENT_AMBULATORY_CARE_PROVIDER_SITE_OTHER): Payer: Self-pay | Admitting: Ophthalmology

## 2020-03-24 DIAGNOSIS — E113312 Type 2 diabetes mellitus with moderate nonproliferative diabetic retinopathy with macular edema, left eye: Secondary | ICD-10-CM | POA: Diagnosis not present

## 2020-03-24 DIAGNOSIS — H2511 Age-related nuclear cataract, right eye: Secondary | ICD-10-CM

## 2020-03-24 DIAGNOSIS — E113311 Type 2 diabetes mellitus with moderate nonproliferative diabetic retinopathy with macular edema, right eye: Secondary | ICD-10-CM

## 2020-03-24 MED ORDER — BEVACIZUMAB 2.5 MG/0.1ML IZ SOSY
2.5000 mg | PREFILLED_SYRINGE | INTRAVITREAL | Status: AC | PRN
  Administered 2020-03-24: 2.5 mg via INTRAVITREAL

## 2020-03-24 NOTE — Assessment & Plan Note (Signed)
OS minor we will continue to observe we will treat if acuity does not improve post cataract surgery in the near future left eye

## 2020-03-24 NOTE — Assessment & Plan Note (Signed)
Improved since onset of therapy December 2021

## 2020-03-24 NOTE — Progress Notes (Signed)
03/24/2020     CHIEF COMPLAINT Patient presents for Retina Follow Up (7 Week NPDR f\u OD. Possible Avastin OD. OCT/Pt states vision has improved. Denies new complaints./BGL: did not check)   HISTORY OF PRESENT ILLNESS: David Mendoza is a 80 y.o. male who presents to the clinic today for:   HPI    Retina Follow Up    Patient presents with  Diabetic Retinopathy.  In right eye.  Severity is moderate.  Duration of 7 weeks.  Since onset it is stable.  I, the attending physician,  performed the HPI with the patient and updated documentation appropriately. Additional comments: 7 Week NPDR f\u OD. Possible Avastin OD. OCT Pt states vision has improved. Denies new complaints. BGL: did not check       Last edited by Tilda Franco on 03/24/2020  8:37 AM. (History)      Referring physician: Josetta Huddle, MD [address],  [place] [address]  HISTORICAL INFORMATION:   Selected notes from the [facility]: No current outpatient medications on file. (Ophthalmic Drugs)   No current facility-administered medications for this visit. (Ophthalmic Drugs)   Current Outpatient Medications (Other)  Medication Sig  . amLODipine (NORVASC) 5 MG tablet Take 5 mg by mouth daily.  Marland Kitchen glipiZIDE (GLUCOTROL) 5 MG tablet Take 5 mg by mouth 2 (two) times daily.  . hydrochlorothiazide (HYDRODIURIL) 12.5 MG tablet Take 12.5 mg by mouth daily.  Marland Kitchen losartan (COZAAR) 100 MG tablet Take 100 mg by mouth daily.  . metFORMIN (GLUCOPHAGE) 500 MG tablet Take 1,000 mg by mouth 2 (two) times daily with a meal.  . omeprazole (PRILOSEC) 40 MG capsule Take 40 mg by mouth daily.  . rosuvastatin (CRESTOR) 20 MG tablet Take 20 mg by mouth daily.   No current facility-administered medications for this visit. (Other)      REVIEW OF SYSTEMS:    ALLERGIES Allergies  Allergen Reactions  . Sulfa Antibiotics Other (See Comments)    Doesn't remember      PAST MEDICAL HISTORY Past Medical History:  Diagnosis Date  . Diabetes mellitus without complication ([place])   . DVT (deep venous thrombosis) ([place])   . Hyperlipemia   . Hypertension   . Peripheral neuropathy   . Sciatica    Past Surgical History:  Procedure Laterality Date  . CRANIOTOMY Bilateral 04/07/2014   Procedure: BILATERAL CRANIOTOMY HEMATOMA EVACUATION SUBDURAL;  Surgeon: Karie Chimera, MD;  Location: [facility];  Service: Neurosurgery;  Laterality: Bilateral;    FAMILY HISTORY History reviewed. No pertinent family history.  SOCIAL HISTORY Social History   Tobacco Use  . Smoking status: Never Smoker  . Smokeless tobacco: Never Used         OPHTHALMIC EXAM: Base Eye Exam    Visual Acuity (Snellen - Linear)      Right Left   Dist [place] 20/80 20/40 +2   Dist ph [place] 20/30 -2 NI       Tonometry (Tonopen, 8:41 AM)      Right Left   Pressure 10 12       Pupils      Pupils Dark Light Shape React APD   Right PERRL 3 3 Round Minimal None   Left PERRL 3 3 Round Minimal None       Visual Fields (Counting fingers)      Left Right    Full Full  Neuro/Psych    Oriented x3: Yes   Mood/Affect: Normal       Dilation    Right eye: 1.0% Mydriacyl, 2.5% Phenylephrine @ 8:41 AM        Slit Lamp and Fundus Exam    External Exam      Right Left   External Normal Normal       Slit Lamp Exam      Right Left   Lids/Lashes Normal Normal   Conjunctiva/Sclera White and quiet White and quiet   Cornea Clear Clear   Anterior Chamber Deep and quiet Deep and quiet   Iris Round and reactive Round and reactive   Lens 2+ Nuclear sclerosis 2+ Nuclear sclerosis   Anterior Vitreous Normal Normal       Fundus Exam      Right Left   Posterior Vitreous Posterior vitreous detachment    Disc Normal    C/D Ratio 0.1    Macula Epiretinal membrane, Microaneurysms,  Less Macular thickening, Cystoid macular edema    Vessels NPDR- Moderate    Periphery Normal            IMAGING AND PROCEDURES  Imaging and Procedures for 03/24/20  OCT, Retina - OU - Both Eyes       Right Eye Quality was good. Scan locations included subfoveal. Central Foveal Thickness: 428. Progression has improved. Findings include epiretinal membrane, cystoid macular edema, abnormal foveal contour.   Left Eye Quality was good. Scan locations included subfoveal. Central Foveal Thickness: 361. Progression has been stable. Findings include cystoid macular edema, abnormal foveal contour.   Notes OD, much improved CSME with less intraretinal fluid collection, injection intravitreal Avastin, overall improved from onset of therapy December 2021.  We will repeat injection today   OS continues with minor CSME centrally, media opacity as well       Intravitreal Injection, Pharmacologic Agent - OD - Right Eye       Time Out 03/24/2020. 9:54 AM. Confirmed correct patient, procedure, site, and patient consented.   Anesthesia Topical anesthesia was used. Anesthetic medications included Akten 3.5%.   Procedure Preparation included Ofloxacin , 10% betadine to eyelids, 5% betadine to ocular surface. A 30 gauge needle was used.   Injection:  2.5 mg Bevacizumab (AVASTIN) 2.5mg /0.66mL SOSY   NDC: 69678-938-10, Lot: [id number]   Route: Intravitreal, Site: Right Eye  Post-op Post injection exam found visual acuity of at least counting fingers. The patient tolerated the procedure well. There were no complications. The patient received written and verbal post procedure care education. Post injection medications were not given.                 ASSESSMENT/PLAN:  Moderate nonproliferative diabetic retinopathy of right eye with macular edema (HCC) Improved since onset of therapy December 2021  Nuclear sclerotic cataract of right eye Scheduled for cataract surgery with Dr. Duanne Guess in the near future  Moderate nonproliferative diabetic retinopathy of left eye with  macular edema associated with type 2 diabetes mellitus ([place]) OS minor we will continue to observe we will treat if acuity does not improve post cataract surgery in the near future left eye      ICD-10-CM   1. Diabetic maculopathy of right eye with moderate nonproliferative retinopathy and macular edema determined by examination associated with type 2 diabetes mellitus (HCC)  E11.3311 OCT, Retina - OU - Both Eyes    Intravitreal Injection, Pharmacologic Agent - OD - Right Eye    bevacizumab (AVASTIN) SOSY  2.5 mg  2. Moderate nonproliferative diabetic retinopathy of right eye with macular edema associated with type 2 diabetes mellitus ([place])  E11.3311   3. Nuclear sclerotic cataract of right eye  H25.11   4. Moderate nonproliferative diabetic retinopathy of left eye with macular edema associated with type 2 diabetes mellitus ([place])  L57.9728     1.  2.  3.  Ophthalmic Meds Ordered this visit:  Meds ordered this encounter  Medications  . bevacizumab (AVASTIN) SOSY 2.5 mg       Return in about 6 weeks (around 05/05/2020) for DILATE OU, AVASTIN OCT, OD.  There are no Patient Instructions on file for this visit.   Explained the diagnoses, plan, and follow up with the patient and they expressed understanding.  Patient expressed understanding of the importance of proper follow up care.   Clent Demark Rankin M.D. Diseases & Surgery of the Retina and Vitreous Retina & Diabetic [place] 03/24/20     Abbreviations: M myopia (nearsighted); A astigmatism; H hyperopia (farsighted); P presbyopia; Mrx spectacle prescription;  CTL contact lenses; OD right eye; OS left eye; OU both eyes  XT exotropia; ET esotropia; PEK punctate epithelial keratitis; PEE punctate epithelial erosions; DES dry eye syndrome; MGD meibomian gland dysfunction; ATs artificial tears; PFAT's preservative free artificial tears; [place] nuclear sclerotic cataract; PSC posterior subcapsular cataract; ERM epi-retinal membrane; PVD  posterior vitreous detachment; RD retinal detachment; DM diabetes mellitus; DR diabetic retinopathy; NPDR non-proliferative diabetic retinopathy; PDR proliferative diabetic retinopathy; CSME clinically significant macular edema; DME diabetic macular edema; dbh dot blot hemorrhages; CWS cotton wool spot; POAG primary open angle glaucoma; C/D cup-to-disc ratio; HVF humphrey visual field; GVF goldmann visual field; OCT optical coherence tomography; IOP intraocular pressure; BRVO Branch retinal vein occlusion; CRVO central retinal vein occlusion; CRAO central retinal artery occlusion; BRAO branch retinal artery occlusion; RT retinal tear; SB scleral buckle; PPV pars plana vitrectomy; VH Vitreous hemorrhage; PRP panretinal laser photocoagulation; IVK intravitreal kenalog; VMT vitreomacular traction; MH Macular hole;  NVD neovascularization of the disc; NVE neovascularization elsewhere; AREDS age related eye disease study; ARMD age related macular degeneration; POAG primary open angle glaucoma; EBMD epithelial/anterior basement membrane dystrophy; ACIOL anterior chamber intraocular lens; IOL intraocular lens; PCIOL posterior chamber intraocular lens; Phaco/IOL phacoemulsification with intraocular lens placement; [place] photorefractive keratectomy; LASIK laser assisted in situ keratomileusis; HTN hypertension; DM diabetes mellitus; COPD chronic obstructive pulmonary disease

## 2020-03-24 NOTE — Assessment & Plan Note (Signed)
Scheduled for cataract surgery with Dr. Duanne Guess in the near future

## 2020-04-15 DIAGNOSIS — H2511 Age-related nuclear cataract, right eye: Secondary | ICD-10-CM | POA: Diagnosis not present

## 2020-04-17 DIAGNOSIS — E1142 Type 2 diabetes mellitus with diabetic polyneuropathy: Secondary | ICD-10-CM | POA: Diagnosis not present

## 2020-04-17 DIAGNOSIS — I10 Essential (primary) hypertension: Secondary | ICD-10-CM | POA: Diagnosis not present

## 2020-04-17 DIAGNOSIS — K219 Gastro-esophageal reflux disease without esophagitis: Secondary | ICD-10-CM | POA: Diagnosis not present

## 2020-04-17 DIAGNOSIS — E1140 Type 2 diabetes mellitus with diabetic neuropathy, unspecified: Secondary | ICD-10-CM | POA: Diagnosis not present

## 2020-04-17 DIAGNOSIS — E782 Mixed hyperlipidemia: Secondary | ICD-10-CM | POA: Diagnosis not present

## 2020-04-17 DIAGNOSIS — E1121 Type 2 diabetes mellitus with diabetic nephropathy: Secondary | ICD-10-CM | POA: Diagnosis not present

## 2020-04-17 DIAGNOSIS — E1165 Type 2 diabetes mellitus with hyperglycemia: Secondary | ICD-10-CM | POA: Diagnosis not present

## 2020-04-17 DIAGNOSIS — E11319 Type 2 diabetes mellitus with unspecified diabetic retinopathy without macular edema: Secondary | ICD-10-CM | POA: Diagnosis not present

## 2020-04-25 DIAGNOSIS — H2512 Age-related nuclear cataract, left eye: Secondary | ICD-10-CM | POA: Diagnosis not present

## 2020-04-29 DIAGNOSIS — H2512 Age-related nuclear cataract, left eye: Secondary | ICD-10-CM | POA: Diagnosis not present

## 2020-05-12 ENCOUNTER — Encounter (INDEPENDENT_AMBULATORY_CARE_PROVIDER_SITE_OTHER): Payer: Self-pay | Admitting: Ophthalmology

## 2020-05-12 ENCOUNTER — Ambulatory Visit (INDEPENDENT_AMBULATORY_CARE_PROVIDER_SITE_OTHER): Payer: Medicare Other | Admitting: Ophthalmology

## 2020-05-12 ENCOUNTER — Other Ambulatory Visit: Payer: Self-pay

## 2020-05-12 DIAGNOSIS — E113311 Type 2 diabetes mellitus with moderate nonproliferative diabetic retinopathy with macular edema, right eye: Secondary | ICD-10-CM

## 2020-05-12 DIAGNOSIS — D3131 Benign neoplasm of right choroid: Secondary | ICD-10-CM | POA: Diagnosis not present

## 2020-05-12 DIAGNOSIS — E113312 Type 2 diabetes mellitus with moderate nonproliferative diabetic retinopathy with macular edema, left eye: Secondary | ICD-10-CM | POA: Diagnosis not present

## 2020-05-12 DIAGNOSIS — Z961 Presence of intraocular lens: Secondary | ICD-10-CM | POA: Diagnosis not present

## 2020-05-12 DIAGNOSIS — H35371 Puckering of macula, right eye: Secondary | ICD-10-CM | POA: Diagnosis not present

## 2020-05-12 MED ORDER — BEVACIZUMAB 2.5 MG/0.1ML IZ SOSY
2.5000 mg | PREFILLED_SYRINGE | INTRAVITREAL | Status: AC | PRN
  Administered 2020-05-12: 2.5 mg via INTRAVITREAL

## 2020-05-12 NOTE — Assessment & Plan Note (Signed)
Continued presence, may have some role in recurrence of CSME, CME OD

## 2020-05-12 NOTE — Assessment & Plan Note (Signed)
No change in time over pack previous examination

## 2020-05-12 NOTE — Assessment & Plan Note (Signed)
Center involved CSME OS has been increased since recent cataract extraction and intraocular displacement, will need to commence therapy with intravitreal Avastin in the near future OS

## 2020-05-12 NOTE — Progress Notes (Signed)
05/12/2020     CHIEF COMPLAINT Patient presents for Retina Follow Up (7 Wk F/U OU, poss Avastin OD//Pt sts [facility] OU is improved following cataract sx OU. No ocular pain reported OU. No new symptoms OU./LBS: does not check at home)   HISTORY OF PRESENT ILLNESS: David Mendoza is a 80 y.o. male who presents to the clinic today for:   HPI    Retina Follow Up    Patient presents with  Diabetic Retinopathy.  In right eye.  This started 7 weeks ago.  Severity is mild.  Duration of 7 weeks.  Since onset it is gradually improving. Additional comments: 7 Wk F/U OU, poss Avastin OD  Pt sts [facility] OU is improved following cataract sx OU. No ocular pain reported OU. No new symptoms OU. LBS: does not check at home       Last edited by Rockie Neighbours, [place] on 05/12/2020  8:56 AM. (History)      Referring physician: Josetta Huddle, MD [address],  [place] [address]  HISTORICAL INFORMATION:   Selected notes from the [facility]: No current outpatient medications on file. (Ophthalmic Drugs)   No current facility-administered medications for this visit. (Ophthalmic Drugs)   Current Outpatient Medications (Other)  Medication Sig  . amLODipine (NORVASC) 5 MG tablet Take 5 mg by mouth daily.  Marland Kitchen glipiZIDE (GLUCOTROL) 5 MG tablet Take 5 mg by mouth 2 (two) times daily.  . hydrochlorothiazide (HYDRODIURIL) 12.5 MG tablet Take 12.5 mg by mouth daily.  Marland Kitchen losartan (COZAAR) 100 MG tablet Take 100 mg by mouth daily.  . metFORMIN (GLUCOPHAGE) 500 MG tablet Take 1,000 mg by mouth 2 (two) times daily with a meal.  . omeprazole (PRILOSEC) 40 MG capsule Take 40 mg by mouth daily.  . rosuvastatin (CRESTOR) 20 MG tablet Take 20 mg by mouth daily.   No current facility-administered medications for this visit. (Other)      REVIEW OF SYSTEMS:    ALLERGIES Allergies  Allergen Reactions  . Sulfa Antibiotics Other (See Comments)    Doesn't remember      PAST MEDICAL HISTORY Past Medical History:  Diagnosis Date  . Diabetes mellitus without complication ([place])   . DVT (deep venous thrombosis) ([place])   . Hyperlipemia   . Hypertension   . Nuclear sclerotic cataract of left eye 12/25/2019  . Nuclear sclerotic cataract of right eye 12/25/2019  . Peripheral neuropathy   . Posterior vitreous detachment of left eye 12/25/2019  . Sciatica    Past Surgical History:  Procedure Laterality Date  . CRANIOTOMY Bilateral 04/07/2014   Procedure: BILATERAL CRANIOTOMY HEMATOMA EVACUATION SUBDURAL;  Surgeon: Karie Chimera, MD;  Location: [facility];  Service: Neurosurgery;  Laterality: Bilateral;    FAMILY HISTORY History reviewed. No pertinent family history.  SOCIAL HISTORY Social History   Tobacco Use  . Smoking status: Never Smoker  . Smokeless tobacco: Never Used         OPHTHALMIC EXAM: Base Eye Exam    Visual Acuity (ETDRS)      Right Left   Dist [place] 20/40 -2 20/30 +2   Dist ph [place] NI 20/25 -2       Tonometry (Tonopen, 8:59 AM)      Right Left   Pressure 17 17       Pupils      Pupils Dark Light Shape React APD   Right PERRL 4  4 Round Minimal None   Left PERRL 4 4 Round Minimal None       Visual Fields (Counting fingers)      Left Right    Full Full       Extraocular Movement      Right Left    Full Full       Neuro/Psych    Oriented x3: Yes   Mood/Affect: Normal       Dilation    Both eyes: 1.0% Mydriacyl, 2.5% Phenylephrine @ 8:59 AM        Slit Lamp and Fundus Exam    External Exam      Right Left   External Normal Normal       Slit Lamp Exam      Right Left   Lids/Lashes Normal Normal   Conjunctiva/Sclera White and quiet White and quiet   Cornea Clear Clear   Anterior Chamber Deep and quiet Deep and quiet   Iris Round and reactive Round and reactive   Lens 2+ Nuclear sclerosis 2+ Nuclear sclerosis   Anterior Vitreous Normal Normal       Fundus Exam      Right Left   Posterior  Vitreous Posterior vitreous detachment Posterior vitreous detachment   Disc Normal Normal   C/D Ratio 0.1 0.1   Macula Epiretinal membrane, Microaneurysms,, Mild clinically significant macular edema Microaneurysms, epiretinal membrane not detectable clinically OS   Vessels NPDR- Moderate NPDR- Moderate   Periphery Normal, posterior pole choroidal nevus OD, no interval change, no high risk features, approximately 2.2 disc diameters and basal diameter Good retinopexy at 2, 2:30          IMAGING AND PROCEDURES  Imaging and Procedures for 05/12/20  OCT, Retina - OU - Both Eyes       Right Eye Quality was good. Scan locations included subfoveal. Central Foveal Thickness: 510. Progression has worsened. Findings include epiretinal membrane, cystoid macular edema, abnormal foveal contour.   Left Eye Quality was good. Scan locations included subfoveal. Central Foveal Thickness: 402. Progression has been stable. Findings include cystoid macular edema, abnormal foveal contour.   Notes Center involved CSME OD with epiretinal membrane, at 7-week interval post injection intravitreal Avastin.  Will repeat injection OD today And new worsening of CSME OS center involved will need treatment in the near future         Intravitreal Injection, Pharmacologic Agent - OD - Right Eye       Time Out 05/12/2020. 9:53 AM. Confirmed correct patient, procedure, site, and patient consented.   Anesthesia Topical anesthesia was used. Anesthetic medications included Akten 3.5%.   Procedure Preparation included Ofloxacin , 10% betadine to eyelids, 5% betadine to ocular surface. A 30 gauge needle was used.   Injection:  2.5 mg Bevacizumab (AVASTIN) 2.5mg /0.4mL SOSY   NDC: 38453-646-80, Lot: [id number]   Route: Intravitreal, Site: Right Eye  Post-op Post injection exam found visual acuity of at least counting fingers. The patient tolerated the procedure well. There were no complications. The patient  received written and verbal post procedure care education. Post injection medications were not given.                 ASSESSMENT/PLAN:  Moderate nonproliferative diabetic retinopathy of right eye with macular edema (HCC) Worsening of CSME at 7-week follow-up.  We will repeat injection today and evaluate for resolution.  If not resolving, there may be some component of epiretinal membrane in the right eye  Macular pucker, right  eye Continued presence, may have some role in recurrence of CSME, CME OD  Choroidal nevus of right eye No change in time over pack previous examination  Moderate nonproliferative diabetic retinopathy of left eye with macular edema associated with type 2 diabetes mellitus ([place]) Center involved CSME OS has been increased since recent cataract extraction and intraocular displacement, will need to commence therapy with intravitreal Avastin in the near future OS      ICD-10-CM   1. Diabetic maculopathy of right eye with moderate nonproliferative retinopathy and macular edema determined by examination associated with type 2 diabetes mellitus (HCC)  E11.3311 OCT, Retina - OU - Both Eyes    Intravitreal Injection, Pharmacologic Agent - OD - Right Eye    bevacizumab (AVASTIN) SOSY 2.5 mg  2. Moderate nonproliferative diabetic retinopathy of right eye with macular edema associated with type 2 diabetes mellitus ([place])  E11.3311   3. Macular pucker, right eye  H35.371   4. Choroidal nevus of right eye  D31.31   5. Moderate nonproliferative diabetic retinopathy of left eye with macular edema associated with type 2 diabetes mellitus ([place])  N23.5573     1.  OD, with epiretinal membrane and with CSME, the last of which has been worsened at 7-week follow-up post recent cataract extraction intraocular lens placement.  We will repeat injection Avastin today and reevaluate soon  2.  Center involved CSME OS is also developed, will treat with Avastin in the coming 2 weeks and  evaluate further thereafter  3.  Epiretinal membrane right eye might need attention if CSME does not improve post antivegF therapy Ophthalmic Meds Ordered this visit:  Meds ordered this encounter  Medications  . bevacizumab (AVASTIN) SOSY 2.5 mg       Return in about 2 weeks (around 05/26/2020) for dilate, OS, AVASTIN OCT.  There are no Patient Instructions on file for this visit.   Explained the diagnoses, plan, and follow up with the patient and they expressed understanding.  Patient expressed understanding of the importance of proper follow up care.   Clent Demark Cowen Pesqueira M.D. Diseases & Surgery of the Retina and Vitreous Retina & Diabetic [place] 05/12/20     Abbreviations: M myopia (nearsighted); A astigmatism; H hyperopia (farsighted); P presbyopia; Mrx spectacle prescription;  CTL contact lenses; OD right eye; OS left eye; OU both eyes  XT exotropia; ET esotropia; PEK punctate epithelial keratitis; PEE punctate epithelial erosions; DES dry eye syndrome; MGD meibomian gland dysfunction; ATs artificial tears; PFAT's preservative free artificial tears; [place] nuclear sclerotic cataract; PSC posterior subcapsular cataract; ERM epi-retinal membrane; PVD posterior vitreous detachment; RD retinal detachment; DM diabetes mellitus; DR diabetic retinopathy; NPDR non-proliferative diabetic retinopathy; PDR proliferative diabetic retinopathy; CSME clinically significant macular edema; DME diabetic macular edema; dbh dot blot hemorrhages; CWS cotton wool spot; POAG primary open angle glaucoma; C/D cup-to-disc ratio; HVF humphrey visual field; GVF goldmann visual field; OCT optical coherence tomography; IOP intraocular pressure; BRVO Branch retinal vein occlusion; CRVO central retinal vein occlusion; CRAO central retinal artery occlusion; BRAO branch retinal artery occlusion; RT retinal tear; SB scleral buckle; PPV pars plana vitrectomy; VH Vitreous hemorrhage; PRP panretinal laser photocoagulation; IVK  intravitreal kenalog; VMT vitreomacular traction; MH Macular hole;  NVD neovascularization of the disc; NVE neovascularization elsewhere; AREDS age related eye disease study; ARMD age related macular degeneration; POAG primary open angle glaucoma; EBMD epithelial/anterior basement membrane dystrophy; ACIOL anterior chamber intraocular lens; IOL intraocular lens; PCIOL posterior chamber intraocular lens; Phaco/IOL phacoemulsification with intraocular lens placement;  [place] photorefractive keratectomy; LASIK laser assisted in situ keratomileusis; HTN hypertension; DM diabetes mellitus; COPD chronic obstructive pulmonary disease

## 2020-05-12 NOTE — Assessment & Plan Note (Signed)
Worsening of CSME at 7-week follow-up.  We will repeat injection today and evaluate for resolution.  If not resolving, there may be some component of epiretinal membrane in the right eye

## 2020-05-19 ENCOUNTER — Other Ambulatory Visit: Payer: Self-pay

## 2020-05-19 ENCOUNTER — Ambulatory Visit (INDEPENDENT_AMBULATORY_CARE_PROVIDER_SITE_OTHER): Payer: Medicare Other | Admitting: Ophthalmology

## 2020-05-19 ENCOUNTER — Encounter (INDEPENDENT_AMBULATORY_CARE_PROVIDER_SITE_OTHER): Payer: Self-pay | Admitting: Ophthalmology

## 2020-05-19 DIAGNOSIS — E782 Mixed hyperlipidemia: Secondary | ICD-10-CM | POA: Diagnosis not present

## 2020-05-19 DIAGNOSIS — E1165 Type 2 diabetes mellitus with hyperglycemia: Secondary | ICD-10-CM | POA: Diagnosis not present

## 2020-05-19 DIAGNOSIS — E113312 Type 2 diabetes mellitus with moderate nonproliferative diabetic retinopathy with macular edema, left eye: Secondary | ICD-10-CM

## 2020-05-19 DIAGNOSIS — H35371 Puckering of macula, right eye: Secondary | ICD-10-CM

## 2020-05-19 DIAGNOSIS — K219 Gastro-esophageal reflux disease without esophagitis: Secondary | ICD-10-CM | POA: Diagnosis not present

## 2020-05-19 DIAGNOSIS — E1140 Type 2 diabetes mellitus with diabetic neuropathy, unspecified: Secondary | ICD-10-CM | POA: Diagnosis not present

## 2020-05-19 DIAGNOSIS — E113311 Type 2 diabetes mellitus with moderate nonproliferative diabetic retinopathy with macular edema, right eye: Secondary | ICD-10-CM | POA: Diagnosis not present

## 2020-05-19 DIAGNOSIS — E11319 Type 2 diabetes mellitus with unspecified diabetic retinopathy without macular edema: Secondary | ICD-10-CM | POA: Diagnosis not present

## 2020-05-19 DIAGNOSIS — E1121 Type 2 diabetes mellitus with diabetic nephropathy: Secondary | ICD-10-CM | POA: Diagnosis not present

## 2020-05-19 DIAGNOSIS — I10 Essential (primary) hypertension: Secondary | ICD-10-CM | POA: Diagnosis not present

## 2020-05-19 DIAGNOSIS — E1142 Type 2 diabetes mellitus with diabetic polyneuropathy: Secondary | ICD-10-CM | POA: Diagnosis not present

## 2020-05-19 MED ORDER — BEVACIZUMAB 2.5 MG/0.1ML IZ SOSY
2.5000 mg | PREFILLED_SYRINGE | INTRAVITREAL | Status: AC | PRN
Start: 2020-05-19 — End: 2020-05-19
  Administered 2020-05-19: 2.5 mg via INTRAVITREAL

## 2020-05-19 NOTE — Assessment & Plan Note (Signed)
CSME has improved 1 week post Avastin thus this retinal thickening was diabetes in origin not because of which responsive to anti-vegF

## 2020-05-19 NOTE — Assessment & Plan Note (Signed)
New increase in intraretinal CSME over the last 5 to 6 months.  We will commence with therapy today OS and look for responsiveness to antivegF

## 2020-05-19 NOTE — Progress Notes (Signed)
05/19/2020     CHIEF COMPLAINT Patient presents for No chief complaint on file.   HISTORY OF PRESENT ILLNESS: David Mendoza is a 80 y.o. male who presents to the clinic today for:   HPI    Status post recent injection Avastin OD, visual acuity stabilized if not improved OD   OS, new onset CSME found last week, for injection evaluation today   Last edited by Hurman Horn, MD on 05/19/2020  8:52 AM. (History)      Referring physician: Josetta Huddle, MD [address],  [place] [address]  HISTORICAL INFORMATION:   Selected notes from the [facility]: No current outpatient medications on file. (Ophthalmic Drugs)   No current facility-administered medications for this visit. (Ophthalmic Drugs)   Current Outpatient Medications (Other)  Medication Sig  . amLODipine (NORVASC) 5 MG tablet Take 5 mg by mouth daily.  Marland Kitchen glipiZIDE (GLUCOTROL) 5 MG tablet Take 5 mg by mouth 2 (two) times daily.  . hydrochlorothiazide (HYDRODIURIL) 12.5 MG tablet Take 12.5 mg by mouth daily.  Marland Kitchen losartan (COZAAR) 100 MG tablet Take 100 mg by mouth daily.  . metFORMIN (GLUCOPHAGE) 500 MG tablet Take 1,000 mg by mouth 2 (two) times daily with a meal.  . omeprazole (PRILOSEC) 40 MG capsule Take 40 mg by mouth daily.  . rosuvastatin (CRESTOR) 20 MG tablet Take 20 mg by mouth daily.   No current facility-administered medications for this visit. (Other)      REVIEW OF SYSTEMS:    ALLERGIES Allergies  Allergen Reactions  . Sulfa Antibiotics Other (See Comments)    Doesn't remember     PAST MEDICAL HISTORY Past Medical History:  Diagnosis Date  . Diabetes mellitus without complication ([place])   . DVT (deep venous thrombosis) ([place])   . Hyperlipemia   . Hypertension   . Nuclear sclerotic cataract of left eye 12/25/2019  . Nuclear sclerotic cataract of right eye 12/25/2019  . Peripheral neuropathy   . Posterior vitreous detachment of  left eye 12/25/2019  . Sciatica    Past Surgical History:  Procedure Laterality Date  . CRANIOTOMY Bilateral 04/07/2014   Procedure: BILATERAL CRANIOTOMY HEMATOMA EVACUATION SUBDURAL;  Surgeon: Karie Chimera, MD;  Location: [facility];  Service: Neurosurgery;  Laterality: Bilateral;    FAMILY HISTORY No family history on file.  SOCIAL HISTORY Social History   Tobacco Use  . Smoking status: Never Smoker  . Smokeless tobacco: Never Used         OPHTHALMIC EXAM:  Base Eye Exam    Visual Acuity (ETDRS)      Right Left   Dist [place] 20/30 +2 20/25 -1   Dist ph [place] NI        Tonometry (Tonopen, 8:20 AM)      Right Left   Pressure 15 15       Neuro/Psych    Oriented x3: Yes   Mood/Affect: Normal       Dilation    Both eyes: 1.0% Mydriacyl, 2.5% Phenylephrine @ 8:20 AM        Slit Lamp and Fundus Exam    External Exam      Right Left   External Normal Normal       Slit Lamp Exam      Right Left   Lids/Lashes Normal Normal   Conjunctiva/Sclera White and quiet White and quiet   Cornea Clear Clear  Anterior Chamber Deep and quiet Deep and quiet   Iris Round and reactive Round and reactive   Lens Centered posterior chamber intraocular lens Centered posterior chamber intraocular lens   Anterior Vitreous Normal Normal       Fundus Exam      Right Left   Posterior Vitreous Posterior vitreous detachment Posterior vitreous detachment   Disc Normal Normal   C/D Ratio 0.1 0.1   Macula Epiretinal membrane, Microaneurysms,, CSME OD not clinically detectable as it is improved, Hard drusen Microaneurysms, epiretinal membrane, CSME OS not clinically detectable, Hard drusen   Vessels NPDR- Moderate NPDR- Moderate   Periphery Normal, posterior pole choroidal nevus OD, no interval change, no high risk features, approximately 2.2 disc diameters and basal diameter Good retinopexy at 2, 2:30          IMAGING AND PROCEDURES  Imaging and Procedures for 05/19/20  OCT,  Retina - OU - Both Eyes       Right Eye Quality was good. Scan locations included subfoveal. Central Foveal Thickness: 421. Progression has worsened. Findings include epiretinal membrane, cystoid macular edema, abnormal foveal contour.   Left Eye Quality was good. Scan locations included subfoveal. Central Foveal Thickness: 363. Progression has been stable. Findings include cystoid macular edema, abnormal foveal contour.   Notes Center involved CSME OD with epiretinal membrane, at 1-week interval post injection intravitreal Avastin.  Improved center involved CSME OD 1 week post Avastin injection, will follow-up as scheduled minor epiretinal membrane OD  OS center involved CSME worse over the last 6 months, now for treatment via intravitreal Avastin OS today       Intravitreal Injection, Pharmacologic Agent - OS - Left Eye       Time Out 05/19/2020. 8:56 AM. Confirmed correct patient, procedure, site, and patient consented.   Anesthesia Topical anesthesia was used. Anesthetic medications included Akten 3.5%.   Procedure Preparation included Tobramycin 0.3%, 10% betadine to eyelids, 5% betadine to ocular surface. A 30 gauge needle was used.   Injection:  2.5 mg Bevacizumab (AVASTIN) 2.5mg /0.11mL SOSY   NDC: 34193-790-24, Lot: [id number]   Route: Intravitreal, Site: Left Eye  Post-op Post injection exam found visual acuity of at least counting fingers. The patient tolerated the procedure well. There were no complications. The patient received written and verbal post procedure care education. Post injection medications were not given.                 ASSESSMENT/PLAN:  Moderate nonproliferative diabetic retinopathy of right eye with macular edema (HCC) CSME has improved 1 week post Avastin thus this retinal thickening was diabetes in origin not because of which responsive to anti-vegF  Moderate nonproliferative diabetic retinopathy of left eye with macular edema associated  with type 2 diabetes mellitus (HCC) New increase in intraretinal CSME over the last 5 to 6 months.  We will commence with therapy today OS and look for responsiveness to antivegF  Macular pucker, right eye Minor no impact on acuity OD      ICD-10-CM   1. Moderate nonproliferative diabetic retinopathy of left eye with macular edema associated with type 2 diabetes mellitus (HCC)  D92.4268 OCT, Retina - OU - Both Eyes    Intravitreal Injection, Pharmacologic Agent - OS - Left Eye    bevacizumab (AVASTIN) SOSY 2.5 mg  2. Macular pucker, right eye  H35.371 OCT, Retina - OU - Both Eyes  3. Diabetic maculopathy of right eye with moderate nonproliferative retinopathy and macular edema determined by examination associated  with type 2 diabetes mellitus (HCC)  E11.3311 OCT, Retina - OU - Both Eyes  4. Moderate nonproliferative diabetic retinopathy of right eye with macular edema associated with type 2 diabetes mellitus ([place])  E11.3311     1.  OD improved nicely 1 week post injection antivegF for CSME thus CME is not secondary to epiretinal membrane but rather diabetic eye disease we will continue to monitor and follow-up and treat as noted necessary  2.  OS, center involved CSME, commence therapy with antivegF, Avastin injection today  3.  Patient inquired about topical use of post cataract surgery eyedrops.  I did instruct him to follow Dr. Velvet Bathe instructions and use his mixture eyedrop 1 drop daily left eye for the duration of the instructions.  Ophthalmic Meds Ordered this visit:  Meds ordered this encounter  Medications  . bevacizumab (AVASTIN) SOSY 2.5 mg       Return in about 6 weeks (around 06/30/2020) for dilate, OS, AVASTIN OCT,, and OD as scheduled dilate Avastin OCT.  There are no Patient Instructions on file for this visit.   Explained the diagnoses, plan, and follow up with the patient and they expressed understanding.  Patient expressed understanding of the importance of proper  follow up care.   Clent Demark Keaston Pile M.D. Diseases & Surgery of the Retina and Vitreous Retina & Diabetic [place] 05/19/20     Abbreviations: M myopia (nearsighted); A astigmatism; H hyperopia (farsighted); P presbyopia; Mrx spectacle prescription;  CTL contact lenses; OD right eye; OS left eye; OU both eyes  XT exotropia; ET esotropia; PEK punctate epithelial keratitis; PEE punctate epithelial erosions; DES dry eye syndrome; MGD meibomian gland dysfunction; ATs artificial tears; PFAT's preservative free artificial tears; [place] nuclear sclerotic cataract; PSC posterior subcapsular cataract; ERM epi-retinal membrane; PVD posterior vitreous detachment; RD retinal detachment; DM diabetes mellitus; DR diabetic retinopathy; NPDR non-proliferative diabetic retinopathy; PDR proliferative diabetic retinopathy; CSME clinically significant macular edema; DME diabetic macular edema; dbh dot blot hemorrhages; CWS cotton wool spot; POAG primary open angle glaucoma; C/D cup-to-disc ratio; HVF humphrey visual field; GVF goldmann visual field; OCT optical coherence tomography; IOP intraocular pressure; BRVO Branch retinal vein occlusion; CRVO central retinal vein occlusion; CRAO central retinal artery occlusion; BRAO branch retinal artery occlusion; RT retinal tear; SB scleral buckle; PPV pars plana vitrectomy; VH Vitreous hemorrhage; PRP panretinal laser photocoagulation; IVK intravitreal kenalog; VMT vitreomacular traction; MH Macular hole;  NVD neovascularization of the disc; NVE neovascularization elsewhere; AREDS age related eye disease study; ARMD age related macular degeneration; POAG primary open angle glaucoma; EBMD epithelial/anterior basement membrane dystrophy; ACIOL anterior chamber intraocular lens; IOL intraocular lens; PCIOL posterior chamber intraocular lens; Phaco/IOL phacoemulsification with intraocular lens placement; [place] photorefractive keratectomy; LASIK laser assisted in situ keratomileusis; HTN  hypertension; DM diabetes mellitus; COPD chronic obstructive pulmonary disease

## 2020-05-19 NOTE — Assessment & Plan Note (Signed)
Minor no impact on acuity OD °

## 2020-06-23 ENCOUNTER — Ambulatory Visit (INDEPENDENT_AMBULATORY_CARE_PROVIDER_SITE_OTHER): Payer: Medicare Other | Admitting: Ophthalmology

## 2020-06-23 ENCOUNTER — Encounter (INDEPENDENT_AMBULATORY_CARE_PROVIDER_SITE_OTHER): Payer: Self-pay | Admitting: Ophthalmology

## 2020-06-23 ENCOUNTER — Other Ambulatory Visit: Payer: Self-pay

## 2020-06-23 DIAGNOSIS — E113311 Type 2 diabetes mellitus with moderate nonproliferative diabetic retinopathy with macular edema, right eye: Secondary | ICD-10-CM | POA: Diagnosis not present

## 2020-06-23 DIAGNOSIS — H35371 Puckering of macula, right eye: Secondary | ICD-10-CM

## 2020-06-23 DIAGNOSIS — E113312 Type 2 diabetes mellitus with moderate nonproliferative diabetic retinopathy with macular edema, left eye: Secondary | ICD-10-CM | POA: Diagnosis not present

## 2020-06-23 DIAGNOSIS — R0683 Snoring: Secondary | ICD-10-CM

## 2020-06-23 MED ORDER — BEVACIZUMAB 2.5 MG/0.1ML IZ SOSY
2.5000 mg | PREFILLED_SYRINGE | INTRAVITREAL | Status: AC | PRN
  Administered 2020-06-23: 2.5 mg via INTRAVITREAL

## 2020-06-23 NOTE — Assessment & Plan Note (Signed)
OS currently at 5-week follow-up, still with chronic center involved CSME.  See comments regarding chronic CSME under the right eye macular edema today

## 2020-06-23 NOTE — Assessment & Plan Note (Signed)
Chronic recurrent center involved CSME OD currently at 6-week follow-up interval.,  In the setting of otherwise moderate nonproliferative diabetic retinopathy, and with recurrence, this does raise the specter of possible superimposition of nightly hypoxia from potentially obstructive sleep apnea or central sleep apnea induced macular hypoxia nightly.  Study was introduced and the patient.  He does report that his wife used to say he snores but has not mentioned it in a long time.

## 2020-06-23 NOTE — Assessment & Plan Note (Addendum)
I will asked patient to reacquire the spouse whether or not that still is the case periodically.  He does awaken once at night  He does have a tendency to nap after eating dinner in the evening and then up takes a brief nap and then reawaken I have asked patient to pay close attention to whether or not the signs and symptoms and they were reviewed at length with the patient regarding sleep apnea might be present and if so we may undertake formal home sleep study testing.  I informed the patient that there is a home sleep study test and not requiring laboratory testing in some form or distant location

## 2020-06-23 NOTE — Progress Notes (Signed)
06/23/2020     CHIEF COMPLAINT Patient presents for Retina Follow Up (6 Wk F/U OD, poss Avastin OD//Pt sts [facility] OU is improving. Pt denies new symptoms OU./A1c: 7.0, "a while")   HISTORY OF PRESENT ILLNESS: David Mendoza is a 80 y.o. male who presents to the clinic today for:   HPI     Retina Follow Up           Diagnosis: Diabetic Retinopathy   Laterality: right eye   Onset: 6 weeks ago   Severity: mild   Duration: 6 weeks   Course: gradually improving   Comments: 6 Wk F/U OD, poss Avastin OD  Pt sts VA OU is improving. Pt denies new symptoms OU. A1c: 7.0, "a while"       Last edited by Milly Jakob, [place] on 06/23/2020  8:04 AM.      Referring physician: Josetta Huddle, MD [address],  [place] [address]  HISTORICAL INFORMATION:   Selected notes from the [facility]: No current outpatient medications on file. (Ophthalmic Drugs)   No current facility-administered medications for this visit. (Ophthalmic Drugs)   Current Outpatient Medications (Other)  Medication Sig   amLODipine (NORVASC) 5 MG tablet Take 5 mg by mouth daily.   glipiZIDE (GLUCOTROL) 5 MG tablet Take 5 mg by mouth 2 (two) times daily.   hydrochlorothiazide (HYDRODIURIL) 12.5 MG tablet Take 12.5 mg by mouth daily.   losartan (COZAAR) 100 MG tablet Take 100 mg by mouth daily.   metFORMIN (GLUCOPHAGE) 500 MG tablet Take 1,000 mg by mouth 2 (two) times daily with a meal.   omeprazole (PRILOSEC) 40 MG capsule Take 40 mg by mouth daily.   rosuvastatin (CRESTOR) 20 MG tablet Take 20 mg by mouth daily.   No current facility-administered medications for this visit. (Other)      REVIEW OF SYSTEMS:    ALLERGIES Allergies  Allergen Reactions   Sulfa Antibiotics Other (See Comments)    Doesn't remember     PAST MEDICAL HISTORY Past Medical History:  Diagnosis Date   Diabetes mellitus without complication ([place])    DVT (deep venous  thrombosis) ([place])    Hyperlipemia    Hypertension    Nuclear sclerotic cataract of left eye 12/25/2019   Nuclear sclerotic cataract of right eye 12/25/2019   Peripheral neuropathy    Posterior vitreous detachment of left eye 12/25/2019   Sciatica    Past Surgical History:  Procedure Laterality Date   CRANIOTOMY Bilateral 04/07/2014   Procedure: BILATERAL CRANIOTOMY HEMATOMA EVACUATION SUBDURAL;  Surgeon: Karie Chimera, MD;  Location: [facility];  Service: Neurosurgery;  Laterality: Bilateral;    FAMILY HISTORY History reviewed. No pertinent family history.  SOCIAL HISTORY Social History   Tobacco Use   Smoking status: Never   Smokeless tobacco: Never         OPHTHALMIC EXAM:  Base Eye Exam     Visual Acuity (ETDRS)       Right Left   Dist [place] 20/40 -2 20/30 +1   Dist ph [place] NI NI         Tonometry (Tonopen, 8:07 AM)       Right Left   Pressure 16 13         Pupils       Pupils Dark Light Shape React APD   Right PERRL 5 4 Round Minimal None   Left PERRL 5 4  Round Minimal None         Visual Fields (Counting fingers)       Left Right    Full Full         Extraocular Movement       Right Left    Full Full         Neuro/Psych     Oriented x3: Yes   Mood/Affect: Normal         Dilation     Right eye: 1.0% Mydriacyl, 2.5% Phenylephrine @ 8:07 AM           Slit Lamp and Fundus Exam     External Exam       Right Left   External Normal Normal         Slit Lamp Exam       Right Left   Lids/Lashes Normal Normal   Conjunctiva/Sclera White and quiet White and quiet   Cornea Clear Clear   Anterior Chamber Deep and quiet Deep and quiet   Iris Round and reactive Round and reactive   Lens Centered posterior chamber intraocular lens Centered posterior chamber intraocular lens   Anterior Vitreous Normal Normal         Fundus Exam       Right Left   Posterior Vitreous Posterior vitreous detachment    Disc Normal     C/D Ratio 0.1    Macula Epiretinal membrane, Microaneurysms,, CSME OD not clinically detectable as it is improved, Hard drusen    Vessels NPDR- Moderate    Periphery Normal, posterior pole choroidal nevus OD, no interval change, no high risk features, approximately 2.2 disc diameters and basal diameter             IMAGING AND PROCEDURES  Imaging and Procedures for 06/23/20  OCT, Retina - OU - Both Eyes       Right Eye Quality was good. Scan locations included subfoveal. Central Foveal Thickness: 464. Progression has worsened. Findings include epiretinal membrane, cystoid macular edema, abnormal foveal contour.   Left Eye Quality was good. Scan locations included subfoveal. Central Foveal Thickness: 426. Progression has been stable. Findings include cystoid macular edema, abnormal foveal contour.   Notes Center involved CSME OD with epiretinal membrane, at 6-week interval post injection intravitreal Avastin.  Improved center involved CSME OD 6 week post Avastin injection, will follow-up as scheduled minor epiretinal membrane OD repeat injection today OD  OS center involved CSME worse over the last 6 months, now for treatment via intravitreal Avastin OS and examination OS as scheduled     Intravitreal Injection, Pharmacologic Agent - OD - Right Eye       Time Out 06/23/2020. 8:53 AM. Confirmed correct patient, procedure, site, and patient consented.   Anesthesia Topical anesthesia was used. Anesthetic medications included Akten 3.5%.   Procedure Preparation included Ofloxacin , 10% betadine to eyelids, 5% betadine to ocular surface. A 30 gauge needle was used.   Injection: 2.5 mg bevacizumab 2.5 MG/0.1ML   Route: Intravitreal   NDC: 27782-423-53   Post-op Post injection exam found visual acuity of at least counting fingers. The patient tolerated the procedure well. There were no complications. The patient received written and verbal post procedure care education. Post  injection medications were not given.              ASSESSMENT/PLAN:  Moderate nonproliferative diabetic retinopathy of right eye with macular edema (HCC) Chronic recurrent center involved CSME OD currently at 6-week follow-up interval.,  In the setting of otherwise moderate nonproliferative diabetic retinopathy, and with recurrence, this does raise the specter of possible superimposition of nightly hypoxia from potentially obstructive sleep apnea or central sleep apnea induced macular hypoxia nightly.  Study was introduced and the patient.  He does report that his wife used to say he snores but has not mentioned it in a long time.  Snores I will asked patient to reacquire the spouse whether or not that still is the case periodically.  He does awaken once at night  He does have a tendency to nap after eating dinner in the evening and then up takes a brief nap and then reawaken I have asked patient to pay close attention to whether or not the signs and symptoms and they were reviewed at length with the patient regarding sleep apnea might be present and if so we may undertake formal home sleep study testing.  I informed the patient that there is a home sleep study test and not requiring laboratory testing in some form or distant location  Moderate nonproliferative diabetic retinopathy of left eye with macular edema associated with type 2 diabetes mellitus ([place]) OS currently at 5-week follow-up, still with chronic center involved CSME.  See comments regarding chronic CSME under the right eye macular edema today  Macular pucker, right eye Some component of macular thickening nasal to the foveal of the right eye yet the waxing and waning within a couple of weeks after injection of antivegF does suggest this is CSME but the chronicity of CSME may in fact be made worse by epiretinal membrane and possible some component of nightly hypoxia of latent, undiagnosed sleep apnea, further testing awaiting      ICD-10-CM   1. Diabetic maculopathy of right eye with moderate nonproliferative retinopathy and macular edema determined by examination associated with type 2 diabetes mellitus (HCC)  E11.3311 OCT, Retina - OU - Both Eyes    Intravitreal Injection, Pharmacologic Agent - OD - Right Eye    bevacizumab (AVASTIN) SOSY 2.5 mg    2. Moderate nonproliferative diabetic retinopathy of right eye with macular edema associated with type 2 diabetes mellitus ([place])  E11.3311     3. Snores  R06.83     4. Moderate nonproliferative diabetic retinopathy of left eye with macular edema associated with type 2 diabetes mellitus ([place])  C94.4967     5. Macular pucker, right eye  H35.371       1.  2.  3.  Ophthalmic Meds Ordered this visit:  Meds ordered this encounter  Medications   bevacizumab (AVASTIN) SOSY 2.5 mg       Return in about 5 weeks (around 07/28/2020) for dilate, OD, AVASTIN OCT, and as scheduled OS.  There are no Patient Instructions on file for this visit.   Explained the diagnoses, plan, and follow up with the patient and they expressed understanding.  Patient expressed understanding of the importance of proper follow up care.   Clent Demark Niccolas Loeper M.D. Diseases & Surgery of the Retina and Vitreous Retina & Diabetic [place] 06/23/20     Abbreviations: M myopia (nearsighted); A astigmatism; H hyperopia (farsighted); P presbyopia; Mrx spectacle prescription;  CTL contact lenses; OD right eye; OS left eye; OU both eyes  XT exotropia; ET esotropia; PEK punctate epithelial keratitis; PEE punctate epithelial erosions; DES dry eye syndrome; MGD meibomian gland dysfunction; ATs artificial tears; PFAT's preservative free artificial tears; [place] nuclear sclerotic cataract; PSC posterior subcapsular cataract; ERM epi-retinal membrane; PVD posterior vitreous  detachment; RD retinal detachment; DM diabetes mellitus; DR diabetic retinopathy; NPDR non-proliferative diabetic retinopathy; PDR  proliferative diabetic retinopathy; CSME clinically significant macular edema; DME diabetic macular edema; dbh dot blot hemorrhages; CWS cotton wool spot; POAG primary open angle glaucoma; C/D cup-to-disc ratio; HVF humphrey visual field; GVF goldmann visual field; OCT optical coherence tomography; IOP intraocular pressure; BRVO Branch retinal vein occlusion; CRVO central retinal vein occlusion; CRAO central retinal artery occlusion; BRAO branch retinal artery occlusion; RT retinal tear; SB scleral buckle; PPV pars plana vitrectomy; VH Vitreous hemorrhage; PRP panretinal laser photocoagulation; IVK intravitreal kenalog; VMT vitreomacular traction; MH Macular hole;  NVD neovascularization of the disc; NVE neovascularization elsewhere; AREDS age related eye disease study; ARMD age related macular degeneration; POAG primary open angle glaucoma; EBMD epithelial/anterior basement membrane dystrophy; ACIOL anterior chamber intraocular lens; IOL intraocular lens; PCIOL posterior chamber intraocular lens; Phaco/IOL phacoemulsification with intraocular lens placement; [place] photorefractive keratectomy; LASIK laser assisted in situ keratomileusis; HTN hypertension; DM diabetes mellitus; COPD chronic obstructive pulmonary disease

## 2020-06-23 NOTE — Assessment & Plan Note (Signed)
Some component of macular thickening nasal to the foveal of the right eye yet the waxing and waning within a couple of weeks after injection of antivegF does suggest this is CSME but the chronicity of CSME may in fact be made worse by epiretinal membrane and possible some component of nightly hypoxia of latent, undiagnosed sleep apnea, further testing awaiting

## 2020-06-29 DIAGNOSIS — E1142 Type 2 diabetes mellitus with diabetic polyneuropathy: Secondary | ICD-10-CM | POA: Diagnosis not present

## 2020-06-29 DIAGNOSIS — E1121 Type 2 diabetes mellitus with diabetic nephropathy: Secondary | ICD-10-CM | POA: Diagnosis not present

## 2020-06-29 DIAGNOSIS — E1165 Type 2 diabetes mellitus with hyperglycemia: Secondary | ICD-10-CM | POA: Diagnosis not present

## 2020-06-29 DIAGNOSIS — I10 Essential (primary) hypertension: Secondary | ICD-10-CM | POA: Diagnosis not present

## 2020-06-29 DIAGNOSIS — E782 Mixed hyperlipidemia: Secondary | ICD-10-CM | POA: Diagnosis not present

## 2020-06-29 DIAGNOSIS — E11319 Type 2 diabetes mellitus with unspecified diabetic retinopathy without macular edema: Secondary | ICD-10-CM | POA: Diagnosis not present

## 2020-06-29 DIAGNOSIS — K219 Gastro-esophageal reflux disease without esophagitis: Secondary | ICD-10-CM | POA: Diagnosis not present

## 2020-06-29 DIAGNOSIS — E1140 Type 2 diabetes mellitus with diabetic neuropathy, unspecified: Secondary | ICD-10-CM | POA: Diagnosis not present

## 2020-06-30 ENCOUNTER — Ambulatory Visit (INDEPENDENT_AMBULATORY_CARE_PROVIDER_SITE_OTHER): Payer: Medicare Other | Admitting: Ophthalmology

## 2020-06-30 ENCOUNTER — Other Ambulatory Visit: Payer: Self-pay

## 2020-06-30 ENCOUNTER — Encounter (INDEPENDENT_AMBULATORY_CARE_PROVIDER_SITE_OTHER): Payer: Self-pay | Admitting: Ophthalmology

## 2020-06-30 DIAGNOSIS — E113312 Type 2 diabetes mellitus with moderate nonproliferative diabetic retinopathy with macular edema, left eye: Secondary | ICD-10-CM

## 2020-06-30 DIAGNOSIS — H35371 Puckering of macula, right eye: Secondary | ICD-10-CM

## 2020-06-30 DIAGNOSIS — E113311 Type 2 diabetes mellitus with moderate nonproliferative diabetic retinopathy with macular edema, right eye: Secondary | ICD-10-CM

## 2020-06-30 DIAGNOSIS — R0683 Snoring: Secondary | ICD-10-CM | POA: Diagnosis not present

## 2020-06-30 MED ORDER — BEVACIZUMAB 2.5 MG/0.1ML IZ SOSY
2.5000 mg | PREFILLED_SYRINGE | INTRAVITREAL | Status: AC | PRN
  Administered 2020-06-30: 2.5 mg via INTRAVITREAL

## 2020-06-30 NOTE — Assessment & Plan Note (Signed)
Epiretinal membrane OD may be playing some role in persistent CME OD

## 2020-06-30 NOTE — Assessment & Plan Note (Signed)
Center involved CSME OS, with no real improvement in fact slightly worse at 6 weeks injection Post Avastin OS, multiple injections have not successfully resolve this  Will repeat injection OS today to stabilize all the retinopathy yet will offer and likely switch to injection intravitreal Eylea OS next.  Patient informed of co-pay assistance application from [employer]

## 2020-06-30 NOTE — Progress Notes (Signed)
06/30/2020     CHIEF COMPLAINT Patient presents for Retina Follow Up (6 week fu OS and Avastin OS/Pt states VA OU stable since last visit. Pt denies FOL, floaters, or ocular pain OU. Karie [place]: 7.0/LBS: unknown/)   HISTORY OF PRESENT ILLNESS: David Mendoza is a 80 y.o. male who presents to the clinic today for:   HPI     Retina Follow Up           Diagnosis: Diabetic Retinopathy   Laterality: left eye   Onset: 6 weeks ago   Severity: mild   Duration: 6 weeks   Course: stable   Comments: 6 week fu OS and Avastin OS Pt states VA OU stable since last visit. Pt denies FOL, floaters, or ocular pain OU.  A1C: 7.0 LBS: unknown        Last edited by Kendra Opitz, COA on 06/30/2020  8:05 AM.      Referring physician: Josetta Huddle, MD [address],  [place] [address]  HISTORICAL INFORMATION:   Selected notes from the [facility]: No current outpatient medications on file. (Ophthalmic Drugs)   No current facility-administered medications for this visit. (Ophthalmic Drugs)   Current Outpatient Medications (Other)  Medication Sig   amLODipine (NORVASC) 5 MG tablet Take 5 mg by mouth daily.   glipiZIDE (GLUCOTROL) 5 MG tablet Take 5 mg by mouth 2 (two) times daily.   hydrochlorothiazide (HYDRODIURIL) 12.5 MG tablet Take 12.5 mg by mouth daily.   losartan (COZAAR) 100 MG tablet Take 100 mg by mouth daily.   metFORMIN (GLUCOPHAGE) 500 MG tablet Take 1,000 mg by mouth 2 (two) times daily with a meal.   omeprazole (PRILOSEC) 40 MG capsule Take 40 mg by mouth daily.   rosuvastatin (CRESTOR) 20 MG tablet Take 20 mg by mouth daily.   No current facility-administered medications for this visit. (Other)      REVIEW OF SYSTEMS:    ALLERGIES Allergies  Allergen Reactions   Sulfa Antibiotics Other (See Comments)    Doesn't remember     PAST MEDICAL HISTORY Past Medical History:  Diagnosis Date   Diabetes  mellitus without complication ([place])    DVT (deep venous thrombosis) ([place])    Hyperlipemia    Hypertension    Nuclear sclerotic cataract of left eye 12/25/2019   Nuclear sclerotic cataract of right eye 12/25/2019   Peripheral neuropathy    Posterior vitreous detachment of left eye 12/25/2019   Sciatica    Past Surgical History:  Procedure Laterality Date   CRANIOTOMY Bilateral 04/07/2014   Procedure: BILATERAL CRANIOTOMY HEMATOMA EVACUATION SUBDURAL;  Surgeon: Karie Chimera, MD;  Location: [facility];  Service: Neurosurgery;  Laterality: Bilateral;    FAMILY HISTORY History reviewed. No pertinent family history.  SOCIAL HISTORY Social History   Tobacco Use   Smoking status: Never   Smokeless tobacco: Never         OPHTHALMIC EXAM:  Base Eye Exam     Visual Acuity (ETDRS)       Right Left   Dist [place] 20/40 -2 20/30 -1   Dist ph [place] NI NI         Tonometry (Tonopen, 8:09 AM)       Right Left   Pressure 18 17         Pupils       Pupils Dark Light Shape React APD   Right PERRL  5 4 Round Minimal None   Left PERRL 5 4 Round Minimal None         Visual Fields (Counting fingers)       Left Right    Full Full         Extraocular Movement       Right Left    Full Full         Neuro/Psych     Oriented x3: Yes   Mood/Affect: Normal         Dilation     Left eye: 1.0% Mydriacyl, 2.5% Phenylephrine @ 8:09 AM           Slit Lamp and Fundus Exam     External Exam       Right Left   External Normal Normal         Slit Lamp Exam       Right Left   Lids/Lashes Normal Normal   Conjunctiva/Sclera White and quiet White and quiet   Cornea Clear Clear   Anterior Chamber Deep and quiet Deep and quiet   Iris Round and reactive Round and reactive   Lens Centered posterior chamber intraocular lens Centered posterior chamber intraocular lens   Anterior Vitreous Normal Normal         Fundus Exam       Right Left   Posterior  Vitreous Normal Posterior vitreous detachment   Disc Normal Normal   C/D Ratio 0.1 0.1   Macula Normal Microaneurysms, epiretinal membrane, CSME OS not clinically detectable, Hard drusen   Vessels Normal NPDR- Moderate   Periphery ly 2.2 disc diameters and basal diameter Good retinopexy at 2, 2:30            IMAGING AND PROCEDURES  Imaging and Procedures for 06/30/20  OCT, Retina - OU - Both Eyes       Right Eye Quality was good. Scan locations included subfoveal. Central Foveal Thickness: 456. Progression has worsened. Findings include epiretinal membrane, cystoid macular edema, abnormal foveal contour.   Left Eye Quality was good. Scan locations included subfoveal. Central Foveal Thickness: 442. Progression has been stable. Findings include cystoid macular edema, abnormal foveal contour.   Notes Center involved CSME OD with epiretinal membrane, at 1-week interval post injection intravitreal Avastin.  Slightly improved improved center involved CSME OD 1 week post Avastin injection, will follow-up as scheduled minor epiretinal membrane OD, and may consider switch right eye to [facility] next visit  OS center involved CSME worse over the last 6 months, now for treatment via intravitreal Avastin OS today and likely switch to Eylea OS next visit     Intravitreal Injection, Pharmacologic Agent - OS - Left Eye       Time Out 06/30/2020. 8:30 AM. Confirmed correct patient, procedure, site, and patient consented.   Anesthesia Topical anesthesia was used. Anesthetic medications included Akten 3.5%.   Procedure Preparation included Tobramycin 0.3%, 10% betadine to eyelids, 5% betadine to ocular surface. A 30 gauge needle was used.   Injection: 2.5 mg bevacizumab 2.5 MG/0.1ML   Route: Intravitreal   NDC: 40981-191-47, Lot: [id number]   Post-op Post injection exam found visual acuity of at least counting fingers. The patient tolerated the procedure well. There were no complications. The  patient received written and verbal post procedure care education. Post injection medications were not given.              ASSESSMENT/PLAN:  Snores Patient reassessed with his spouse, and he reports that he  does not snore per her observation  He read about sleep apnea and he does not seem to have no signs or symptoms  Moderate nonproliferative diabetic retinopathy of left eye with macular edema associated with type 2 diabetes mellitus ([place]) Center involved CSME OS, with no real improvement in fact slightly worse at 6 weeks injection Post Avastin OS, multiple injections have not successfully resolve this  Will repeat injection OS today to stabilize all the retinopathy yet will offer and likely switch to injection intravitreal Eylea OS next.  Patient informed of co-pay assistance application from [place]  Moderate nonproliferative diabetic retinopathy of right eye with macular edema (HCC) OD only slightly improved at 1 week post Avastin.  Will likely follow-up next dilate OD next and likely switch to intravitreal Eylea OD next  Notably patient reassessed personally and does not snore at home now and does not have self describes symptoms of sleep apnea and its side effects  Macular pucker, right eye Epiretinal membrane OD may be playing some role in persistent CME OD     ICD-10-CM   1. Moderate nonproliferative diabetic retinopathy of left eye with macular edema associated with type 2 diabetes mellitus (HCC)  O35.0093 OCT, Retina - OU - Both Eyes    Intravitreal Injection, Pharmacologic Agent - OS - Left Eye    bevacizumab (AVASTIN) SOSY 2.5 mg    2. Snores  R06.83     3. Moderate nonproliferative diabetic retinopathy of right eye with macular edema associated with type 2 diabetes mellitus ([place])  E11.3311     4. Macular pucker, right eye  H35.371       1.  We will repeat intravitreal Avastin OS today for chronic center involved CSME.  We will likely switch to  intravitreal Avastin OS next  2.  Dilate OD next likely switch to intravitreal Eylea OD next  3.  We will asked patient to apply for [place] assistance for co-pay assistance to allow financially for his switch to this high cost medication  Ophthalmic Meds Ordered this visit:  Meds ordered this encounter  Medications   bevacizumab (AVASTIN) SOSY 2.5 mg       Return in about 6 weeks (around 08/11/2020) for Next visit switch OD to Eylea injection, dilate, OS, EYLEA OCT.  There are no Patient Instructions on file for this visit.   Explained the diagnoses, plan, and follow up with the patient and they expressed understanding.  Patient expressed understanding of the importance of proper follow up care.   Clent Demark Jakhai Fant M.D. Diseases & Surgery of the Retina and Vitreous Retina & Diabetic [place] 06/30/20     Abbreviations: M myopia (nearsighted); A astigmatism; H hyperopia (farsighted); P presbyopia; Mrx spectacle prescription;  CTL contact lenses; OD right eye; OS left eye; OU both eyes  XT exotropia; ET esotropia; PEK punctate epithelial keratitis; PEE punctate epithelial erosions; DES dry eye syndrome; MGD meibomian gland dysfunction; ATs artificial tears; PFAT's preservative free artificial tears; [place] nuclear sclerotic cataract; PSC posterior subcapsular cataract; ERM epi-retinal membrane; PVD posterior vitreous detachment; RD retinal detachment; DM diabetes mellitus; DR diabetic retinopathy; NPDR non-proliferative diabetic retinopathy; PDR proliferative diabetic retinopathy; CSME clinically significant macular edema; DME diabetic macular edema; dbh dot blot hemorrhages; CWS cotton wool spot; POAG primary open angle glaucoma; C/D cup-to-disc ratio; HVF humphrey visual field; GVF goldmann visual field; OCT optical coherence tomography; IOP intraocular pressure; BRVO Branch retinal vein occlusion; CRVO central retinal vein occlusion; CRAO central retinal artery occlusion; BRAO  branch retinal artery occlusion; RT retinal tear; SB scleral buckle; PPV pars plana vitrectomy; VH Vitreous hemorrhage; PRP panretinal laser photocoagulation; IVK intravitreal kenalog; VMT vitreomacular traction; MH Macular hole;  NVD neovascularization of the disc; NVE neovascularization elsewhere; AREDS age related eye disease study; ARMD age related macular degeneration; POAG primary open angle glaucoma; EBMD epithelial/anterior basement membrane dystrophy; ACIOL anterior chamber intraocular lens; IOL intraocular lens; PCIOL posterior chamber intraocular lens; Phaco/IOL phacoemulsification with intraocular lens placement; [place] photorefractive keratectomy; LASIK laser assisted in situ keratomileusis; HTN hypertension; DM diabetes mellitus; COPD chronic obstructive pulmonary disease

## 2020-06-30 NOTE — Assessment & Plan Note (Signed)
OD only slightly improved at 1 week post Avastin.  Will likely follow-up next dilate OD next and likely switch to intravitreal Eylea OD next  Notably patient reassessed personally and does not snore at home now and does not have self describes symptoms of sleep apnea and its side effects

## 2020-06-30 NOTE — Assessment & Plan Note (Signed)
Patient reassessed with his spouse, and he reports that he does not snore per her observation  He read about sleep apnea and he does not seem to have no signs or symptoms

## 2020-07-06 DIAGNOSIS — C4441 Basal cell carcinoma of skin of scalp and neck: Secondary | ICD-10-CM | POA: Diagnosis not present

## 2020-07-06 DIAGNOSIS — L989 Disorder of the skin and subcutaneous tissue, unspecified: Secondary | ICD-10-CM | POA: Diagnosis not present

## 2020-07-21 DIAGNOSIS — C4441 Basal cell carcinoma of skin of scalp and neck: Secondary | ICD-10-CM | POA: Diagnosis not present

## 2020-07-28 ENCOUNTER — Encounter (INDEPENDENT_AMBULATORY_CARE_PROVIDER_SITE_OTHER): Payer: Medicare Other | Admitting: Ophthalmology

## 2020-07-29 ENCOUNTER — Encounter (INDEPENDENT_AMBULATORY_CARE_PROVIDER_SITE_OTHER): Payer: Medicare Other | Admitting: Ophthalmology

## 2020-07-29 ENCOUNTER — Other Ambulatory Visit: Payer: Self-pay

## 2020-07-29 ENCOUNTER — Encounter (INDEPENDENT_AMBULATORY_CARE_PROVIDER_SITE_OTHER): Payer: Self-pay | Admitting: Ophthalmology

## 2020-07-29 ENCOUNTER — Ambulatory Visit (INDEPENDENT_AMBULATORY_CARE_PROVIDER_SITE_OTHER): Payer: Medicare Other | Admitting: Ophthalmology

## 2020-07-29 DIAGNOSIS — E113311 Type 2 diabetes mellitus with moderate nonproliferative diabetic retinopathy with macular edema, right eye: Secondary | ICD-10-CM

## 2020-07-29 DIAGNOSIS — E113312 Type 2 diabetes mellitus with moderate nonproliferative diabetic retinopathy with macular edema, left eye: Secondary | ICD-10-CM

## 2020-07-29 DIAGNOSIS — H35371 Puckering of macula, right eye: Secondary | ICD-10-CM

## 2020-07-29 MED ORDER — AFLIBERCEPT 2MG/0.05ML IZ SOLN FOR KALEIDOSCOPE
2.0000 mg | INTRAVITREAL | Status: AC | PRN
Start: 2020-07-29 — End: 2020-07-29
  Administered 2020-07-29: 2 mg via INTRAVITREAL

## 2020-07-29 NOTE — Assessment & Plan Note (Signed)
Scheduled follow-up August 3 will change this to middle of August due to upcoming basal cell surgery removal from his right side of his neck

## 2020-07-29 NOTE — Assessment & Plan Note (Signed)
Stable perhaps some component of CME

## 2020-07-29 NOTE — Assessment & Plan Note (Addendum)
The nature of diabetic macular edema was discussed with the patient. Treatment options were outlined including medical therapy, laser & vitrectomy. The use of injectable medications reviewed, including Avastin, Lucentis, and Eylea. Periodic injections into the eye are likely to resolve diabetic macular edema (swelling in the center of vision). Initially, injections are delivered are delivered every 4-6 weeks, and the interval extended as the condition improves. On average, 8-9 injections the first year, and 5 in year 2. Improvement in the condition most often improves on medical therapy. Occasional use of focal laser is also recommended for residual macular edema (swelling). Excellent control of blood glucose and blood pressure are encouraged under the care of a primary physician or endocrinologist. Similarly, attempts to maintain serum cholesterol, low density lipoproteins, and high-density lipoproteins in a favorable range were recommended.   Center involved CSME OD with 20/40 vision slow to improve on Avastin intravitreal less  Will commence with intravitreal Eylea OD today

## 2020-07-29 NOTE — Progress Notes (Signed)
07/29/2020     CHIEF COMPLAINT Patient presents for Retina Evaluation (OD currently at 5-week status post most recent antivegF.//Patient reports some improvement in seeing a small clot across the room in each eye.) and Retina Follow Up (6 Wk F/U OD, poss Avastin OD//Pt sts [facility] OU is improving. Pt denies new symptoms OU./A1c: 7.0, "a while")   HISTORY OF PRESENT ILLNESS: David Mendoza is a 80 y.o. male who presents to the clinic today for:   HPI     Retina Evaluation           Laterality: right eye   Comments: OD currently at 5-week status post most recent antivegF.  Patient reports some improvement in seeing a small clot across the room in each eye.         Retina Follow Up           Diagnosis: Diabetic Retinopathy   Laterality: right eye   Onset: 6 weeks ago   Severity: mild   Duration: 6 weeks   Course: gradually improving   Comments: 6 Wk F/U OD, poss Avastin OD  Pt sts [facility] OU is improving. Pt denies new symptoms OU. A1c: 7.0, "a while"       Last edited by Hurman Horn, MD on 07/29/2020  8:10 AM.      Referring physician: Josetta Huddle, MD [address],  [place] [address]  HISTORICAL INFORMATION:   Selected notes from the [facility]: No current outpatient medications on file. (Ophthalmic Drugs)   No current facility-administered medications for this visit. (Ophthalmic Drugs)   Current Outpatient Medications (Other)  Medication Sig   amLODipine (NORVASC) 5 MG tablet Take 5 mg by mouth daily.   glipiZIDE (GLUCOTROL) 5 MG tablet Take 5 mg by mouth 2 (two) times daily.   hydrochlorothiazide (HYDRODIURIL) 12.5 MG tablet Take 12.5 mg by mouth daily.   losartan (COZAAR) 100 MG tablet Take 100 mg by mouth daily.   metFORMIN (GLUCOPHAGE) 500 MG tablet Take 1,000 mg by mouth 2 (two) times daily with a meal.   omeprazole (PRILOSEC) 40 MG capsule Take 40 mg by mouth daily.   rosuvastatin (CRESTOR) 20  MG tablet Take 20 mg by mouth daily.   No current facility-administered medications for this visit. (Other)      REVIEW OF SYSTEMS:    ALLERGIES Allergies  Allergen Reactions   Sulfa Antibiotics Other (See Comments)    Doesn't remember     PAST MEDICAL HISTORY Past Medical History:  Diagnosis Date   Diabetes mellitus without complication ([place])    DVT (deep venous thrombosis) ([place])    Hyperlipemia    Hypertension    Nuclear sclerotic cataract of left eye 12/25/2019   Nuclear sclerotic cataract of right eye 12/25/2019   Peripheral neuropathy    Posterior vitreous detachment of left eye 12/25/2019   Sciatica    Past Surgical History:  Procedure Laterality Date   CRANIOTOMY Bilateral 04/07/2014   Procedure: BILATERAL CRANIOTOMY HEMATOMA EVACUATION SUBDURAL;  Surgeon: Karie Chimera, MD;  Location: [facility];  Service: Neurosurgery;  Laterality: Bilateral;    FAMILY HISTORY No family history on file.  SOCIAL HISTORY Social History   Tobacco Use   Smoking status: Never   Smokeless tobacco: Never         OPHTHALMIC EXAM:  Base Eye Exam     Visual Acuity (ETDRS)       Right  Left   Dist [place] 20/40 +1 20/30 -2         Tonometry (Tonopen, 8:11 AM)       Right Left   Pressure 10 14         Pupils       Pupils APD   Right PERRL None   Left PERRL None         Visual Fields       Left Right    Full Full         Neuro/Psych     Oriented x3: Yes   Mood/Affect: Normal         Dilation     Right eye: 1.0% Mydriacyl, 2.5% Phenylephrine @ 8:11 AM           Slit Lamp and Fundus Exam     External Exam       Right Left   External Normal Normal         Slit Lamp Exam       Right Left   Lids/Lashes Normal Normal   Conjunctiva/Sclera White and quiet White and quiet   Cornea Clear Clear   Anterior Chamber Deep and quiet Deep and quiet   Iris Round and reactive Round and reactive   Lens Centered posterior chamber intraocular  lens Centered posterior chamber intraocular lens   Anterior Vitreous Normal Normal         Fundus Exam       Right Left   Posterior Vitreous Posterior vitreous detachment    Disc Normal    C/D Ratio 0.1    Macula Epiretinal membrane, Microaneurysms,, CSME OD not clinically detectable as it is improved, Hard drusen    Vessels NPDR- Moderate    Periphery Normal, posterior pole choroidal nevus OD, no interval change, no high risk features, approximately 2.2 disc diameters and basal diameter             IMAGING AND PROCEDURES  Imaging and Procedures for 07/29/20  OCT, Retina - OU - Both Eyes       Right Eye Quality was good. Scan locations included subfoveal. Central Foveal Thickness: 448. Progression has worsened. Findings include epiretinal membrane, cystoid macular edema, abnormal foveal contour.   Left Eye Quality was good. Scan locations included subfoveal. Central Foveal Thickness: 309. Progression has been stable. Findings include cystoid macular edema, abnormal foveal contour.   Notes Center involved CSME OD with epiretinal membrane, at 5-week interval post injection intravitreal Avastin.  Slightly improved improved center involved CSME OD 5 week post Avastin injection, will follow-up as scheduled minor epiretinal membrane OD, and will switch right eye to Eylea today       Intravitreal Injection, Pharmacologic Agent - OD - Right Eye       Time Out 07/29/2020. 8:28 AM. Confirmed correct patient, procedure, site, and patient consented.   Anesthesia Topical anesthesia was used. Anesthetic medications included Akten 3.5%.   Procedure Preparation included Ofloxacin , 10% betadine to eyelids, 5% betadine to ocular surface. A 30 gauge needle was used.   Injection: 2 mg aflibercept 2 MG/0.05ML   Route: Intravitreal, Site: Right Eye   NDC: [id number], Lot: [id number], Waste: 0 mL   Post-op Post injection exam found visual acuity of at least counting fingers. The  patient tolerated the procedure well. There were no complications. The patient received written and verbal post procedure care education. Post injection medications were not given.  ASSESSMENT/PLAN:  Moderate nonproliferative diabetic retinopathy of right eye with macular edema (HCC)  The nature of diabetic macular edema was discussed with the patient. Treatment options were outlined including medical therapy, laser & vitrectomy. The use of injectable medications reviewed, including Avastin, Lucentis, and Eylea. Periodic injections into the eye are likely to resolve diabetic macular edema (swelling in the center of vision). Initially, injections are delivered are delivered every 4-6 weeks, and the interval extended as the condition improves. On average, 8-9 injections the first year, and 5 in year 2. Improvement in the condition most often improves on medical therapy. Occasional use of focal laser is also recommended for residual macular edema (swelling). Excellent control of blood glucose and blood pressure are encouraged under the care of a primary physician or endocrinologist. Similarly, attempts to maintain serum cholesterol, low density lipoproteins, and high-density lipoproteins in a favorable range were recommended.   Center involved CSME OD with 20/40 vision slow to improve on Avastin intravitreal less  Will commence with intravitreal Eylea OD today  Macular pucker, right eye Stable perhaps some component of CME  Moderate nonproliferative diabetic retinopathy of left eye with macular edema associated with type 2 diabetes mellitus ([place]) Scheduled follow-up August 3 will change this to middle of August due to upcoming basal cell surgery removal from his right side of his neck     ICD-10-CM   1. Diabetic maculopathy of right eye with moderate nonproliferative retinopathy and macular edema determined by examination associated with type 2 diabetes mellitus (HCC)  E11.3311 OCT,  Retina - OU - Both Eyes    Intravitreal Injection, Pharmacologic Agent - OD - Right Eye    aflibercept (EYLEA) SOLN 2 mg    2. Moderate nonproliferative diabetic retinopathy of right eye with macular edema associated with type 2 diabetes mellitus ([place])  E11.3311     3. Macular pucker, right eye  H35.371     4. Moderate nonproliferative diabetic retinopathy of left eye with macular edema associated with type 2 diabetes mellitus ([place])  B76.2831       1.  Bilateral CSME slightly improved OD on Avastin but still with center involvement and slow to clear on intravitreal Avastin OD and OS.  2.  We will repeat injection intravitreal antivegF OD today, change to Eylea OD today  3.  Injection of vegF scheduled follow-up OS August 3 will be changed to middle of August due to patient's upcoming Mohs surgery for basal cell cancer of the neck  Ophthalmic Meds Ordered this visit:  Meds ordered this encounter  Medications   aflibercept (EYLEA) SOLN 2 mg       Return in about 6 weeks (around 09/09/2020) for dilate, OD, EYLEA OCT,,, and dilate OS Eylea OCT OS second week of August.  There are no Patient Instructions on file for this visit.   Explained the diagnoses, plan, and follow up with the patient and they expressed understanding.  Patient expressed understanding of the importance of proper follow up care.   Clent Demark Piercen Covino M.D. Diseases & Surgery of the Retina and Vitreous Retina & Diabetic [place] 07/29/20     Abbreviations: M myopia (nearsighted); A astigmatism; H hyperopia (farsighted); P presbyopia; Mrx spectacle prescription;  CTL contact lenses; OD right eye; OS left eye; OU both eyes  XT exotropia; ET esotropia; PEK punctate epithelial keratitis; PEE punctate epithelial erosions; DES dry eye syndrome; MGD meibomian gland dysfunction; ATs artificial tears; PFAT's preservative free artificial tears; [place] nuclear sclerotic cataract; PSC posterior subcapsular  cataract; ERM  epi-retinal membrane; PVD posterior vitreous detachment; RD retinal detachment; DM diabetes mellitus; DR diabetic retinopathy; NPDR non-proliferative diabetic retinopathy; PDR proliferative diabetic retinopathy; CSME clinically significant macular edema; DME diabetic macular edema; dbh dot blot hemorrhages; CWS cotton wool spot; POAG primary open angle glaucoma; C/D cup-to-disc ratio; HVF humphrey visual field; GVF goldmann visual field; OCT optical coherence tomography; IOP intraocular pressure; BRVO Branch retinal vein occlusion; CRVO central retinal vein occlusion; CRAO central retinal artery occlusion; BRAO branch retinal artery occlusion; RT retinal tear; SB scleral buckle; PPV pars plana vitrectomy; VH Vitreous hemorrhage; PRP panretinal laser photocoagulation; IVK intravitreal kenalog; VMT vitreomacular traction; MH Macular hole;  NVD neovascularization of the disc; NVE neovascularization elsewhere; AREDS age related eye disease study; ARMD age related macular degeneration; POAG primary open angle glaucoma; EBMD epithelial/anterior basement membrane dystrophy; ACIOL anterior chamber intraocular lens; IOL intraocular lens; PCIOL posterior chamber intraocular lens; Phaco/IOL phacoemulsification with intraocular lens placement; [place] photorefractive keratectomy; LASIK laser assisted in situ keratomileusis; HTN hypertension; DM diabetes mellitus; COPD chronic obstructive pulmonary disease

## 2020-08-09 DIAGNOSIS — C44319 Basal cell carcinoma of skin of other parts of face: Secondary | ICD-10-CM | POA: Diagnosis not present

## 2020-08-11 ENCOUNTER — Encounter (INDEPENDENT_AMBULATORY_CARE_PROVIDER_SITE_OTHER): Payer: Medicare Other | Admitting: Ophthalmology

## 2020-08-12 DIAGNOSIS — E782 Mixed hyperlipidemia: Secondary | ICD-10-CM | POA: Diagnosis not present

## 2020-08-12 DIAGNOSIS — E1142 Type 2 diabetes mellitus with diabetic polyneuropathy: Secondary | ICD-10-CM | POA: Diagnosis not present

## 2020-08-12 DIAGNOSIS — E1121 Type 2 diabetes mellitus with diabetic nephropathy: Secondary | ICD-10-CM | POA: Diagnosis not present

## 2020-08-12 DIAGNOSIS — I10 Essential (primary) hypertension: Secondary | ICD-10-CM | POA: Diagnosis not present

## 2020-08-12 DIAGNOSIS — E11319 Type 2 diabetes mellitus with unspecified diabetic retinopathy without macular edema: Secondary | ICD-10-CM | POA: Diagnosis not present

## 2020-08-12 DIAGNOSIS — K219 Gastro-esophageal reflux disease without esophagitis: Secondary | ICD-10-CM | POA: Diagnosis not present

## 2020-08-12 DIAGNOSIS — E1140 Type 2 diabetes mellitus with diabetic neuropathy, unspecified: Secondary | ICD-10-CM | POA: Diagnosis not present

## 2020-08-12 DIAGNOSIS — E1165 Type 2 diabetes mellitus with hyperglycemia: Secondary | ICD-10-CM | POA: Diagnosis not present

## 2020-08-18 ENCOUNTER — Encounter (INDEPENDENT_AMBULATORY_CARE_PROVIDER_SITE_OTHER): Payer: Medicare Other | Admitting: Ophthalmology

## 2020-08-24 ENCOUNTER — Other Ambulatory Visit: Payer: Self-pay

## 2020-08-24 ENCOUNTER — Ambulatory Visit (INDEPENDENT_AMBULATORY_CARE_PROVIDER_SITE_OTHER): Payer: Medicare Other | Admitting: Ophthalmology

## 2020-08-24 ENCOUNTER — Encounter (INDEPENDENT_AMBULATORY_CARE_PROVIDER_SITE_OTHER): Payer: Self-pay | Admitting: Ophthalmology

## 2020-08-24 DIAGNOSIS — E113312 Type 2 diabetes mellitus with moderate nonproliferative diabetic retinopathy with macular edema, left eye: Secondary | ICD-10-CM | POA: Diagnosis not present

## 2020-08-24 DIAGNOSIS — E113311 Type 2 diabetes mellitus with moderate nonproliferative diabetic retinopathy with macular edema, right eye: Secondary | ICD-10-CM | POA: Diagnosis not present

## 2020-08-24 DIAGNOSIS — H35371 Puckering of macula, right eye: Secondary | ICD-10-CM

## 2020-08-24 MED ORDER — AFLIBERCEPT 2MG/0.05ML IZ SOLN FOR KALEIDOSCOPE
2.0000 mg | INTRAVITREAL | Status: AC | PRN
  Administered 2020-08-24: 2 mg via INTRAVITREAL

## 2020-08-24 NOTE — Assessment & Plan Note (Signed)
OD vastly improved post Eylea No. 1.  Less center involved CSME follow-up as scheduled next

## 2020-08-24 NOTE — Assessment & Plan Note (Signed)
OS also slightly improved CSME with contralateral injection OD 7 weeks ago.  We will repeat Pete injection yet we will use Eylea OS today and examination next in 8 weeks

## 2020-08-24 NOTE — Progress Notes (Signed)
08/24/2020     CHIEF COMPLAINT Patient presents for Retina Follow Up (6 Wk F/U OD, poss Avastin OD//Pt sts [facility] OU is improving. Pt denies new symptoms OU./A1c: 7.0, "a while")   HISTORY OF PRESENT ILLNESS: David Mendoza is a 80 y.o. male who presents to the clinic today for:   HPI     Retina Follow Up           Diagnosis: Diabetic Retinopathy   Laterality: left eye   Onset: 8 weeks ago   Severity: mild   Duration: 8 weeks   Course: gradually improving   Comments: 6 Wk F/U OD, poss Avastin OD  Pt sts [facility] OU is improving. Pt denies new symptoms OU. A1c: 7.0, "a while"         Comments   8 weeks (7 weeks 6 days) fu os eylea os (drug change) Pt states he feels like his vision is better. Floaters stable ou. Pt states "out of the blue I see flashes of light more from the right eye I think. Since I had cataract surgery in April. It is very rare that it happens." A1C: unknown, pt states "last time I went to the doctor it was 7.0." BS: does not check      Last edited by Laurin Coder, COA on 08/24/2020  8:36 AM.      Referring physician: Josetta Huddle, MD [address],  [place] [address]  HISTORICAL INFORMATION:   Selected notes from the [facility]: No current outpatient medications on file. (Ophthalmic Drugs)   No current facility-administered medications for this visit. (Ophthalmic Drugs)   Current Outpatient Medications (Other)  Medication Sig   amLODipine (NORVASC) 5 MG tablet Take 5 mg by mouth daily.   glipiZIDE (GLUCOTROL) 5 MG tablet Take 5 mg by mouth 2 (two) times daily.   hydrochlorothiazide (HYDRODIURIL) 12.5 MG tablet Take 12.5 mg by mouth daily.   losartan (COZAAR) 100 MG tablet Take 100 mg by mouth daily.   metFORMIN (GLUCOPHAGE) 500 MG tablet Take 1,000 mg by mouth 2 (two) times daily with a meal.   omeprazole (PRILOSEC) 40 MG capsule Take 40 mg by mouth daily.   rosuvastatin (CRESTOR)  20 MG tablet Take 20 mg by mouth daily.   No current facility-administered medications for this visit. (Other)      REVIEW OF SYSTEMS:    ALLERGIES Allergies  Allergen Reactions   Sulfa Antibiotics Other (See Comments)    Doesn't remember     PAST MEDICAL HISTORY Past Medical History:  Diagnosis Date   Diabetes mellitus without complication ([place])    DVT (deep venous thrombosis) ([place])    Hyperlipemia    Hypertension    Nuclear sclerotic cataract of left eye 12/25/2019   Nuclear sclerotic cataract of right eye 12/25/2019   Peripheral neuropathy    Posterior vitreous detachment of left eye 12/25/2019   Sciatica    Past Surgical History:  Procedure Laterality Date   CRANIOTOMY Bilateral 04/07/2014   Procedure: BILATERAL CRANIOTOMY HEMATOMA EVACUATION SUBDURAL;  Surgeon: Karie Chimera, MD;  Location: [facility];  Service: Neurosurgery;  Laterality: Bilateral;    FAMILY HISTORY History reviewed. No pertinent family history.  SOCIAL HISTORY Social History   Tobacco Use   Smoking status: Never   Smokeless tobacco: Never         OPHTHALMIC EXAM:  Base Eye Exam     Visual Acuity (  ETDRS)       Right Left   Dist [place] 20/25 -2 20/30 -1+2         Tonometry (Tonopen, 8:39 AM)       Right Left   Pressure 16 17         Pupils       Pupils Dark Light Shape React APD   Right PERRL 5 4 Round Minimal None   Left PERRL 5 4 Round Minimal None         Visual Fields (Counting fingers)       Left Right    Full Full         Extraocular Movement       Right Left    Full Full         Neuro/Psych     Oriented x3: Yes   Mood/Affect: Normal         Dilation     Left eye: 1.0% Mydriacyl, 2.5% Phenylephrine @ 8:39 AM           Slit Lamp and Fundus Exam     External Exam       Right Left   External Normal Normal         Slit Lamp Exam       Right Left   Lids/Lashes Normal Normal   Conjunctiva/Sclera White and quiet White and  quiet   Cornea Clear Clear   Anterior Chamber Deep and quiet Deep and quiet   Iris Round and reactive Round and reactive   Lens Centered posterior chamber intraocular lens Centered posterior chamber intraocular lens   Anterior Vitreous Normal Normal         Fundus Exam       Right Left   Posterior Vitreous  Posterior vitreous detachment   Disc  Normal   C/D Ratio  0.1   Macula  Microaneurysms, epiretinal membrane, CSME OS not clinically detectable, Hard drusen   Vessels  NPDR- Moderate   Periphery  Good retinopexy at 2, 2:30            IMAGING AND PROCEDURES  Imaging and Procedures for 08/24/20  OCT, Retina - OU - Both Eyes       Right Eye Quality was good. Scan locations included subfoveal. Central Foveal Thickness: 374. Progression has worsened. Findings include epiretinal membrane, abnormal foveal contour.   Left Eye Quality was good. Scan locations included subfoveal. Central Foveal Thickness: 306. Progression has been stable. Findings include cystoid macular edema, abnormal foveal contour.   Notes Involved CSME OD now resolved after injection of #1 intravitreal Eylea.  We will reevaluate the right eye as scheduled and likely extend interval examination.  OS, concomitant improvement in CSME likely from distance effect of the intravitreal Eylea OD.  Still needs intravitreal Eylea OS today for center involved CSME and likely follow-up in 7 weeks     Intravitreal Injection, Pharmacologic Agent - OS - Left Eye       Time Out 08/24/2020. 9:15 AM. Confirmed correct patient, procedure, site, and patient consented.   Anesthesia Topical anesthesia was used. Anesthetic medications included Akten 3.5%.   Procedure Preparation included Tobramycin 0.3%, 10% betadine to eyelids, 5% betadine to ocular surface. A 30 gauge needle was used.   Injection: 2 mg aflibercept 2 MG/0.05ML   Route: Intravitreal, Site: Left Eye   NDC: [id number], Lot: [id number], Waste: 0 mL    Post-op Post injection exam found visual acuity of at least counting fingers. The patient tolerated  the procedure well. There were no complications. The patient received written and verbal post procedure care education. Post injection medications were not given.              ASSESSMENT/PLAN:  Moderate nonproliferative diabetic retinopathy of right eye with macular edema (HCC) OD vastly improved post Eylea No. 1.  Less center involved CSME follow-up as scheduled next  Moderate nonproliferative diabetic retinopathy of left eye with macular edema associated with type 2 diabetes mellitus (HCC) OS also slightly improved CSME with contralateral injection OD 7 weeks ago.  We will repeat Pete injection yet we will use Eylea OS today and examination next in 8 weeks  Macular pucker, right eye The nature of macular pucker (epiretinal membrane ERM) was discussed with the patient as well as threshold criteria for vitrectomy surgery. I explained that in rare cases another surgery is needed to actually remove a second wrinkle should it regrow.  Most often, the epiretinal membrane and underlying wrinkled internal limiting membrane are removed with the first surgery, to accomplish the goals.   If the operative eye is [place] (natural lens still present), cataract surgery is often recommended prior to Vitrectomy. This will enable the retina surgeon to have the best view during surgery and the patient to obtain optimal results in the future. Treatment options were discussed.     ICD-10-CM   1. Moderate nonproliferative diabetic retinopathy of left eye with macular edema associated with type 2 diabetes mellitus (HCC)  [id number] OCT, Retina - OU - Both Eyes    Intravitreal Injection, Pharmacologic Agent - OS - Left Eye    aflibercept (EYLEA) SOLN 2 mg    2. Moderate nonproliferative diabetic retinopathy of right eye with macular edema associated with type 2 diabetes mellitus ([place])  E11.3311     3. Macular  pucker, right eye  H35.371       CSME vastly improved OD, will follow-up as scheduled  2.  Amino acid also improving we will repeat antivegF yet with Eylea OS today and follow-up next in 8 weeks OS  3.  Ophthalmic Meds Ordered this visit:  Meds ordered this encounter  Medications   aflibercept (EYLEA) SOLN 2 mg       Return in about 8 weeks (around 10/19/2020) for dilate, OS, EYLEA OCT, and OD as scheduled.  There are no Patient Instructions on file for this visit.   Explained the diagnoses, plan, and follow up with the patient and they expressed understanding.  Patient expressed understanding of the importance of proper follow up care.   Clent Demark Yanilen Adamik M.D. Diseases & Surgery of the Retina and Vitreous Retina & Diabetic [place] 08/24/20     Abbreviations: M myopia (nearsighted); A astigmatism; H hyperopia (farsighted); P presbyopia; Mrx spectacle prescription;  CTL contact lenses; OD right eye; OS left eye; OU both eyes  XT exotropia; ET esotropia; PEK punctate epithelial keratitis; PEE punctate epithelial erosions; DES dry eye syndrome; MGD meibomian gland dysfunction; ATs artificial tears; PFAT's preservative free artificial tears; [place] nuclear sclerotic cataract; PSC posterior subcapsular cataract; ERM epi-retinal membrane; PVD posterior vitreous detachment; RD retinal detachment; DM diabetes mellitus; DR diabetic retinopathy; NPDR non-proliferative diabetic retinopathy; PDR proliferative diabetic retinopathy; CSME clinically significant macular edema; DME diabetic macular edema; dbh dot blot hemorrhages; CWS cotton wool spot; POAG primary open angle glaucoma; C/D cup-to-disc ratio; HVF humphrey visual field; GVF goldmann visual field; OCT optical coherence tomography; IOP intraocular pressure; BRVO Branch retinal vein occlusion; CRVO central retinal vein occlusion; CRAO  central retinal artery occlusion; BRAO branch retinal artery occlusion; RT retinal tear; SB scleral buckle;  PPV pars plana vitrectomy; VH Vitreous hemorrhage; PRP panretinal laser photocoagulation; IVK intravitreal kenalog; VMT vitreomacular traction; MH Macular hole;  NVD neovascularization of the disc; NVE neovascularization elsewhere; AREDS age related eye disease study; ARMD age related macular degeneration; POAG primary open angle glaucoma; EBMD epithelial/anterior basement membrane dystrophy; ACIOL anterior chamber intraocular lens; IOL intraocular lens; PCIOL posterior chamber intraocular lens; Phaco/IOL phacoemulsification with intraocular lens placement; [place] photorefractive keratectomy; LASIK laser assisted in situ keratomileusis; HTN hypertension; DM diabetes mellitus; COPD chronic obstructive pulmonary disease

## 2020-08-24 NOTE — Assessment & Plan Note (Signed)

## 2020-09-09 ENCOUNTER — Ambulatory Visit (INDEPENDENT_AMBULATORY_CARE_PROVIDER_SITE_OTHER): Payer: Medicare Other | Admitting: Ophthalmology

## 2020-09-09 ENCOUNTER — Other Ambulatory Visit: Payer: Self-pay

## 2020-09-09 ENCOUNTER — Encounter (INDEPENDENT_AMBULATORY_CARE_PROVIDER_SITE_OTHER): Payer: Self-pay | Admitting: Ophthalmology

## 2020-09-09 DIAGNOSIS — E113311 Type 2 diabetes mellitus with moderate nonproliferative diabetic retinopathy with macular edema, right eye: Secondary | ICD-10-CM

## 2020-09-09 DIAGNOSIS — H35371 Puckering of macula, right eye: Secondary | ICD-10-CM

## 2020-09-09 DIAGNOSIS — D3131 Benign neoplasm of right choroid: Secondary | ICD-10-CM

## 2020-09-09 DIAGNOSIS — E113312 Type 2 diabetes mellitus with moderate nonproliferative diabetic retinopathy with macular edema, left eye: Secondary | ICD-10-CM

## 2020-09-09 MED ORDER — AFLIBERCEPT 2MG/0.05ML IZ SOLN FOR KALEIDOSCOPE
2.0000 mg | INTRAVITREAL | Status: AC | PRN
  Administered 2020-09-09: 2 mg via INTRAVITREAL

## 2020-09-09 NOTE — Assessment & Plan Note (Signed)
Stable and macular posterior pole no change

## 2020-09-09 NOTE — Progress Notes (Signed)
09/09/2020     CHIEF COMPLAINT Patient presents for  Chief Complaint  Patient presents with   Retina Follow Up    6 Wk F/U OD, poss Avastin OD  Pt sts [facility] OU is improving. Pt denies new symptoms OU. A1c: 7.0, "a while"      HISTORY OF PRESENT ILLNESS: David Mendoza is a 80 y.o. male who presents to the clinic today for:   HPI     Retina Follow Up   Patient presents with  Diabetic Retinopathy.  In right eye.  This started 6 weeks ago.  Severity is mild.  Duration of 6 weeks.  Since onset it is stable. Additional comments: 6 Wk F/U OD, poss Avastin OD  Pt sts [facility] OU is improving. Pt denies new symptoms OU. A1c: 7.0, "a while"        Comments   6 week fu od oct eylea od Patient states vision is stable and unchanged since last visit. Denies any new floaters or FOL. Does not check BS. A1C: 7, "about 2 months ago I guess."      Last edited by Laurin Coder on 09/09/2020  8:31 AM.      Referring physician: Josetta Huddle, MD [address],  [place] [address]  HISTORICAL INFORMATION:   Selected notes from the [facility]: No current outpatient medications on file. (Ophthalmic Drugs)   No current facility-administered medications for this visit. (Ophthalmic Drugs)   Current Outpatient Medications (Other)  Medication Sig   amLODipine (NORVASC) 5 MG tablet Take 5 mg by mouth daily.   glipiZIDE (GLUCOTROL) 5 MG tablet Take 5 mg by mouth 2 (two) times daily.   hydrochlorothiazide (HYDRODIURIL) 12.5 MG tablet Take 12.5 mg by mouth daily.   losartan (COZAAR) 100 MG tablet Take 100 mg by mouth daily.   metFORMIN (GLUCOPHAGE) 500 MG tablet Take 1,000 mg by mouth 2 (two) times daily with a meal.   omeprazole (PRILOSEC) 40 MG capsule Take 40 mg by mouth daily.   rosuvastatin (CRESTOR) 20 MG tablet Take 20 mg by mouth daily.   No current facility-administered medications for this visit. (Other)      REVIEW OF  SYSTEMS:    ALLERGIES Allergies  Allergen Reactions   Sulfa Antibiotics Other (See Comments)    Doesn't remember     PAST MEDICAL HISTORY Past Medical History:  Diagnosis Date   Diabetes mellitus without complication ([place])    DVT (deep venous thrombosis) ([place])    Hyperlipemia    Hypertension    Nuclear sclerotic cataract of left eye 12/25/2019   Nuclear sclerotic cataract of right eye 12/25/2019   Peripheral neuropathy    Posterior vitreous detachment of left eye 12/25/2019   Sciatica    Past Surgical History:  Procedure Laterality Date   CRANIOTOMY Bilateral 04/07/2014   Procedure: BILATERAL CRANIOTOMY HEMATOMA EVACUATION SUBDURAL;  Surgeon: Karie Chimera, MD;  Location: [facility];  Service: Neurosurgery;  Laterality: Bilateral;    FAMILY HISTORY History reviewed. No pertinent family history.  SOCIAL HISTORY Social History   Tobacco Use   Smoking status: Never   Smokeless tobacco: Never         OPHTHALMIC EXAM:  Base Eye Exam     Visual Acuity (ETDRS)       Right Left   Dist [place] 20/25 -1 20/25 -2         Tonometry (Tonopen, 8:11 AM)  Right Left   Pressure 12 12         Pupils       Pupils Dark Light APD   Right PERRL 5 4 None   Left PERRL 5 4 None         Extraocular Movement       Right Left    Full Full         Neuro/Psych     Oriented x3: Yes   Mood/Affect: Normal         Dilation     Right eye: 1.0% Mydriacyl, 2.5% Phenylephrine @ 8:11 AM           Slit Lamp and Fundus Exam     External Exam       Right Left   External Normal Normal         Slit Lamp Exam       Right Left   Lids/Lashes Normal Normal   Conjunctiva/Sclera White and quiet White and quiet   Cornea Clear Clear   Anterior Chamber Deep and quiet Deep and quiet   Iris Round and reactive Round and reactive   Lens Centered posterior chamber intraocular lens Centered posterior chamber intraocular lens   Anterior Vitreous Normal Normal          Fundus Exam       Right Left   Posterior Vitreous Posterior vitreous detachment    Disc Normal    C/D Ratio 0.1    Macula Epiretinal membrane, Microaneurysms,, CSME OD not clinically detectable as it is improved, Hard drusen    Vessels NPDR- Moderate    Periphery Normal, posterior pole choroidal nevus OD, no interval change, no high risk features, approximately 2.2 disc diameters and basal diameter             IMAGING AND PROCEDURES  Imaging and Procedures for 09/09/20  OCT, Retina - OU - Both Eyes       Right Eye Quality was good. Scan locations included subfoveal. Central Foveal Thickness: 374. Progression has worsened. Findings include epiretinal membrane, abnormal foveal contour.   Left Eye Quality was good. Scan locations included subfoveal. Central Foveal Thickness: 347. Progression has been stable. Findings include cystoid macular edema, abnormal foveal contour.   Notes Involved CSME OD now  after injections intravitreal Eylea.  Currently at 6-week interval OD we will repeat injection today and examination next in 8 weeks OS, concomitant improvement in CSME likely from distance effect of the intravitreal Eylea OD.  Much improved again OS after direct injection 2 weeks prior, continue to monitor OS     Intravitreal Injection, Pharmacologic Agent - OD - Right Eye       Time Out 09/09/2020. 8:42 AM. Confirmed correct patient, procedure, site, and patient consented.   Anesthesia Topical anesthesia was used. Anesthetic medications included Akten 3.5%.   Procedure Preparation included Ofloxacin , 10% betadine to eyelids, 5% betadine to ocular surface. A 30 gauge needle was used.   Injection: 2 mg aflibercept 2 MG/0.05ML   Route: Intravitreal, Site: Right Eye   NDC: [id number], Lot: [id number], Waste: 0 mL   Post-op Post injection exam found visual acuity of at least counting fingers. The patient tolerated the procedure well. There were no  complications. The patient received written and verbal post procedure care education. Post injection medications were not given.              ASSESSMENT/PLAN:  Macular pucker, right eye Minor epiretinal membrane no impact on acuity  at this time  Moderate nonproliferative diabetic retinopathy of right eye with macular edema (HCC) Macular edema OD now responsive to intravitreal antivegF, Eylea.  At 6-week interval.  We will repeat injection today and extend interval examination next 8 weeks  Moderate nonproliferative diabetic retinopathy of left eye with macular edema associated with type 2 diabetes mellitus (HCC) OS much improved CSME on Eylea currently scheduled for follow-up at 8-week interval mid October 2022  Choroidal nevus of right eye Stable and macular posterior pole no change     ICD-10-CM   1. Moderate nonproliferative diabetic retinopathy of right eye with macular edema associated with type 2 diabetes mellitus (HCC)  E11.3311 OCT, Retina - OU - Both Eyes    Intravitreal Injection, Pharmacologic Agent - OD - Right Eye    aflibercept (EYLEA) SOLN 2 mg    2. Macular pucker, right eye  H35.371     3. Moderate nonproliferative diabetic retinopathy of left eye with macular edema associated with type 2 diabetes mellitus ([place])  [id number]     4. Choroidal nevus of right eye  D31.31       1.  OD much improved CSME, will repeat injection today at 6-week interval and extend interval examination next 8 weeks  2.  OS also 2 weeks post most recent Eylea injection looks great today.  Follow-up as scheduled  3.  Small choroidal nevus right eye no interval change  Ophthalmic Meds Ordered this visit:  Meds ordered this encounter  Medications   aflibercept (EYLEA) SOLN 2 mg       Return in about 8 weeks (around 11/04/2020) for dilate, OD, EYLEA OCT.  There are no Patient Instructions on file for this visit.   Explained the diagnoses, plan, and follow up with the patient  and they expressed understanding.  Patient expressed understanding of the importance of proper follow up care.   Clent Demark Ulmer Degen M.D. Diseases & Surgery of the Retina and Vitreous Retina & Diabetic [place] 09/09/20     Abbreviations: M myopia (nearsighted); A astigmatism; H hyperopia (farsighted); P presbyopia; Mrx spectacle prescription;  CTL contact lenses; OD right eye; OS left eye; OU both eyes  XT exotropia; ET esotropia; PEK punctate epithelial keratitis; PEE punctate epithelial erosions; DES dry eye syndrome; MGD meibomian gland dysfunction; ATs artificial tears; PFAT's preservative free artificial tears; [place] nuclear sclerotic cataract; PSC posterior subcapsular cataract; ERM epi-retinal membrane; PVD posterior vitreous detachment; RD retinal detachment; DM diabetes mellitus; DR diabetic retinopathy; NPDR non-proliferative diabetic retinopathy; PDR proliferative diabetic retinopathy; CSME clinically significant macular edema; DME diabetic macular edema; dbh dot blot hemorrhages; CWS cotton wool spot; POAG primary open angle glaucoma; C/D cup-to-disc ratio; HVF humphrey visual field; GVF goldmann visual field; OCT optical coherence tomography; IOP intraocular pressure; BRVO Branch retinal vein occlusion; CRVO central retinal vein occlusion; CRAO central retinal artery occlusion; BRAO branch retinal artery occlusion; RT retinal tear; SB scleral buckle; PPV pars plana vitrectomy; VH Vitreous hemorrhage; PRP panretinal laser photocoagulation; IVK intravitreal kenalog; VMT vitreomacular traction; MH Macular hole;  NVD neovascularization of the disc; NVE neovascularization elsewhere; AREDS age related eye disease study; ARMD age related macular degeneration; POAG primary open angle glaucoma; EBMD epithelial/anterior basement membrane dystrophy; ACIOL anterior chamber intraocular lens; IOL intraocular lens; PCIOL posterior chamber intraocular lens; Phaco/IOL phacoemulsification with intraocular lens  placement; [place] photorefractive keratectomy; LASIK laser assisted in situ keratomileusis; HTN hypertension; DM diabetes mellitus; COPD chronic obstructive pulmonary disease

## 2020-09-09 NOTE — Assessment & Plan Note (Signed)
Minor epiretinal membrane no impact on acuity at this time

## 2020-09-09 NOTE — Assessment & Plan Note (Signed)
OS much improved CSME on Eylea currently scheduled for follow-up at 8-week interval mid October 2022

## 2020-09-09 NOTE — Assessment & Plan Note (Signed)
Macular edema OD now responsive to intravitreal antivegF, Eylea.  At 6-week interval.  We will repeat injection today and extend interval examination next 8 weeks

## 2020-10-19 ENCOUNTER — Other Ambulatory Visit: Payer: Self-pay

## 2020-10-19 ENCOUNTER — Ambulatory Visit (INDEPENDENT_AMBULATORY_CARE_PROVIDER_SITE_OTHER): Payer: Medicare Other | Admitting: Ophthalmology

## 2020-10-19 ENCOUNTER — Encounter (INDEPENDENT_AMBULATORY_CARE_PROVIDER_SITE_OTHER): Payer: Self-pay | Admitting: Ophthalmology

## 2020-10-19 DIAGNOSIS — R0683 Snoring: Secondary | ICD-10-CM

## 2020-10-19 DIAGNOSIS — E113312 Type 2 diabetes mellitus with moderate nonproliferative diabetic retinopathy with macular edema, left eye: Secondary | ICD-10-CM

## 2020-10-19 MED ORDER — AFLIBERCEPT 2MG/0.05ML IZ SOLN FOR KALEIDOSCOPE
2.0000 mg | INTRAVITREAL | Status: AC | PRN
  Administered 2020-10-19: 2 mg via INTRAVITREAL

## 2020-10-19 NOTE — Progress Notes (Signed)
10/19/2020     CHIEF COMPLAINT Patient presents for  Chief Complaint  Patient presents with   Retina Follow Up      HISTORY OF PRESENT ILLNESS: David Mendoza is a 80 y.o. male who presents to the clinic today for:   HPI     Retina Follow Up   Patient presents with  Diabetic Retinopathy.  In left eye.  This started 8 weeks ago.  Severity is mild.  Duration of 8 weeks.  Since onset it is gradually improving.        Comments   8 week fu os and oct and eylea os Pt states, "I think my vision seems better since I was here last time." A1C: 7.0 LBS: Have not taken it        Last edited by Kendra Opitz, COA on 10/19/2020  8:39 AM.      Referring physician: Josetta Huddle, MD [address],  [place] [address]  HISTORICAL INFORMATION:   Selected notes from the [facility]: No current outpatient medications on file. (Ophthalmic Drugs)   No current facility-administered medications for this visit. (Ophthalmic Drugs)   Current Outpatient Medications (Other)  Medication Sig   amLODipine (NORVASC) 5 MG tablet Take 5 mg by mouth daily.   glipiZIDE (GLUCOTROL) 5 MG tablet Take 5 mg by mouth 2 (two) times daily.   hydrochlorothiazide (HYDRODIURIL) 12.5 MG tablet Take 12.5 mg by mouth daily.   losartan (COZAAR) 100 MG tablet Take 100 mg by mouth daily.   metFORMIN (GLUCOPHAGE) 500 MG tablet Take 1,000 mg by mouth 2 (two) times daily with a meal.   omeprazole (PRILOSEC) 40 MG capsule Take 40 mg by mouth daily.   rosuvastatin (CRESTOR) 20 MG tablet Take 20 mg by mouth daily.   No current facility-administered medications for this visit. (Other)      REVIEW OF SYSTEMS:    ALLERGIES Allergies  Allergen Reactions   Sulfa Antibiotics Other (See Comments)    Doesn't remember     PAST MEDICAL HISTORY Past Medical History:  Diagnosis Date   Diabetes mellitus without complication ([place])    DVT (deep venous  thrombosis) ([place])    Hyperlipemia    Hypertension    Nuclear sclerotic cataract of left eye 12/25/2019   Nuclear sclerotic cataract of right eye 12/25/2019   Peripheral neuropathy    Posterior vitreous detachment of left eye 12/25/2019   Sciatica    Past Surgical History:  Procedure Laterality Date   CRANIOTOMY Bilateral 04/07/2014   Procedure: BILATERAL CRANIOTOMY HEMATOMA EVACUATION SUBDURAL;  Surgeon: Karie Chimera, MD;  Location: [facility];  Service: Neurosurgery;  Laterality: Bilateral;    FAMILY HISTORY History reviewed. No pertinent family history.  SOCIAL HISTORY Social History   Tobacco Use   Smoking status: Never   Smokeless tobacco: Never         OPHTHALMIC EXAM:  Base Eye Exam     Visual Acuity (ETDRS)       Right Left   Dist [place] 20/25 -2 20/25 -2         Tonometry (Tonopen, 8:42 AM)       Right Left   Pressure 14 13         Pupils       Pupils Dark Light Shape React APD   Right PERRL 3 2 Round Brisk None   Left PERRL 3 2 Round Brisk None  Visual Fields (Counting fingers)       Left Right    Full Full         Extraocular Movement       Right Left    Full Full         Neuro/Psych     Oriented x3: Yes   Mood/Affect: Normal         Dilation     Left eye: 1.0% Mydriacyl, 2.5% Phenylephrine @ 8:42 AM           Slit Lamp and Fundus Exam     External Exam       Right Left   External Normal Normal         Slit Lamp Exam       Right Left   Lids/Lashes Normal Normal   Conjunctiva/Sclera White and quiet White and quiet   Cornea Clear Clear   Anterior Chamber Deep and quiet Deep and quiet   Iris Round and reactive Round and reactive   Lens Centered posterior chamber intraocular lens Centered posterior chamber intraocular lens   Anterior Vitreous Normal Normal         Fundus Exam       Right Left   Posterior Vitreous  Posterior vitreous detachment   Disc  Normal   C/D Ratio  0.1   Macula   Microaneurysms, epiretinal membrane, CSME OS not clinically detectable, Hard drusen   Vessels  NPDR- Moderate   Periphery  Good retinopexy at 2, 2:30            IMAGING AND PROCEDURES  Imaging and Procedures for 10/19/20  OCT, Retina - OU - Both Eyes       Right Eye Quality was good. Scan locations included subfoveal. Central Foveal Thickness: 378. Progression has worsened. Findings include epiretinal membrane, abnormal foveal contour.   Left Eye Quality was good. Scan locations included subfoveal. Central Foveal Thickness: 360. Progression has been stable. Findings include cystoid macular edema, abnormal foveal contour.   Notes Involved CSME OD now  after injections intravitreal Eylea.  Currently at 6-week interval OD, follow-up examination OD as scheduled  OS, concomitant improvement in CSME likely from distance effect of the intravitreal Eylea OD.  Much improved again OS after direct injection 3 weeks prior, and repeat injection OS today at 8-week interval     Intravitreal Injection, Pharmacologic Agent - OS - Left Eye       Time Out 10/19/2020. 9:31 AM. Confirmed correct patient, procedure, site, and patient consented.   Anesthesia Topical anesthesia was used. Anesthetic medications included Akten 3.5%.   Procedure Preparation included Tobramycin 0.3%, 10% betadine to eyelids, 5% betadine to ocular surface. A 30 gauge needle was used.   Injection: 2 mg aflibercept 2 MG/0.05ML   Route: Intravitreal, Site: Left Eye   NDC: [id number], Lot: [id number], Waste: 0 mL   Post-op Post injection exam found visual acuity of at least counting fingers. The patient tolerated the procedure well. There were no complications. The patient received written and verbal post procedure care education. Post injection medications included ocuflox.              ASSESSMENT/PLAN:  Snores Patient has a history of snoring and only nocturia once nightly thus review of systems for  sleep apnea are minimal at best.  This is good because we do not want nightly hypoxic damage to the macular  In the last he has macular condition from diabetic retinopathy while is responsive but somewhat does  not have a lingering beneficial effect of the intravitreal injections.  We often do see this in patients who suffer nightly macular hypoxic and hypertensive stress from untreated OSA.  I have recommended the patient's reach out to his PCP simply for a home sleep study, screening test in order to assure ourselves that he is receiving excellent oxygenation throughout the unconscious part of his life, nighttime sleeping  Moderate nonproliferative diabetic retinopathy of left eye with macular edema associated with type 2 diabetes mellitus ([place]) Much improved center involved CSME OS on intravitreal Eylea.  Nonetheless some recurrence at 8-week interval.Thus we will repeat injection today, and follow-up again in 8 weeks       ICD-10-CM   1. Moderate nonproliferative diabetic retinopathy of left eye with macular edema associated with type 2 diabetes mellitus (HCC)  Z30.8657 OCT, Retina - OU - Both Eyes    Intravitreal Injection, Pharmacologic Agent - OS - Left Eye    aflibercept (EYLEA) SOLN 2 mg    2. Snores  R06.83       1.  OS, with improving center involved CSME on Eylea as compared to resistance to Avastin.  We will repeat injection today and follow-up OS next in 8 weeks  2.  OD follow-up as scheduled  3.  I have asked the patient to reach out to his PCP for a screening home sleep study simply to confirm that his CNS system and a enophthalmic system is not stressed with nightly macular hypoxia and hypertension from undiagnosed and/or untreated OSA  Ophthalmic Meds Ordered this visit:  Meds ordered this encounter  Medications   aflibercept (EYLEA) SOLN 2 mg       Return in about 8 weeks (around 12/14/2020) for dilate, OS, EYLEA OCT,, and follow-up OD as scheduled.  There are no  Patient Instructions on file for this visit.   Explained the diagnoses, plan, and follow up with the patient and they expressed understanding.  Patient expressed understanding of the importance of proper follow up care.   Clent Demark Pam Vanalstine M.D. Diseases & Surgery of the Retina and Vitreous Retina & Diabetic [place] 10/19/20     Abbreviations: M myopia (nearsighted); A astigmatism; H hyperopia (farsighted); P presbyopia; Mrx spectacle prescription;  CTL contact lenses; OD right eye; OS left eye; OU both eyes  XT exotropia; ET esotropia; PEK punctate epithelial keratitis; PEE punctate epithelial erosions; DES dry eye syndrome; MGD meibomian gland dysfunction; ATs artificial tears; PFAT's preservative free artificial tears; [place] nuclear sclerotic cataract; PSC posterior subcapsular cataract; ERM epi-retinal membrane; PVD posterior vitreous detachment; RD retinal detachment; DM diabetes mellitus; DR diabetic retinopathy; NPDR non-proliferative diabetic retinopathy; PDR proliferative diabetic retinopathy; CSME clinically significant macular edema; DME diabetic macular edema; dbh dot blot hemorrhages; CWS cotton wool spot; POAG primary open angle glaucoma; C/D cup-to-disc ratio; HVF humphrey visual field; GVF goldmann visual field; OCT optical coherence tomography; IOP intraocular pressure; BRVO Branch retinal vein occlusion; CRVO central retinal vein occlusion; CRAO central retinal artery occlusion; BRAO branch retinal artery occlusion; RT retinal tear; SB scleral buckle; PPV pars plana vitrectomy; VH Vitreous hemorrhage; PRP panretinal laser photocoagulation; IVK intravitreal kenalog; VMT vitreomacular traction; MH Macular hole;  NVD neovascularization of the disc; NVE neovascularization elsewhere; AREDS age related eye disease study; ARMD age related macular degeneration; POAG primary open angle glaucoma; EBMD epithelial/anterior basement membrane dystrophy; ACIOL anterior chamber intraocular lens; IOL  intraocular lens; PCIOL posterior chamber intraocular lens; Phaco/IOL phacoemulsification with intraocular lens placement; PRK photorefractive keratectomy; LASIK laser assisted  in situ keratomileusis; HTN hypertension; DM diabetes mellitus; COPD chronic obstructive pulmonary disease

## 2020-10-19 NOTE — Assessment & Plan Note (Signed)
Much improved center involved CSME OS on intravitreal Eylea.  Nonetheless some recurrence at 8-week interval.Thus we will repeat injection today, and follow-up again in 8 weeks

## 2020-10-19 NOTE — Assessment & Plan Note (Signed)
Patient has a history of snoring and only nocturia once nightly thus review of systems for sleep apnea are minimal at best.  This is good because we do not want nightly hypoxic damage to the macular  In the last he has macular condition from diabetic retinopathy while is responsive but somewhat does not have a lingering beneficial effect of the intravitreal injections.  We often do see this in patients who suffer nightly macular hypoxic and hypertensive stress from untreated OSA.  I have recommended the patient's reach out to his PCP simply for a home sleep study, screening test in order to assure ourselves that he is receiving excellent oxygenation throughout the unconscious part of his life, nighttime sleeping

## 2020-11-08 ENCOUNTER — Encounter (INDEPENDENT_AMBULATORY_CARE_PROVIDER_SITE_OTHER): Payer: Self-pay | Admitting: Ophthalmology

## 2020-11-08 ENCOUNTER — Other Ambulatory Visit: Payer: Self-pay

## 2020-11-08 ENCOUNTER — Ambulatory Visit (INDEPENDENT_AMBULATORY_CARE_PROVIDER_SITE_OTHER): Payer: Medicare Other | Admitting: Ophthalmology

## 2020-11-08 DIAGNOSIS — D3131 Benign neoplasm of right choroid: Secondary | ICD-10-CM

## 2020-11-08 DIAGNOSIS — E113312 Type 2 diabetes mellitus with moderate nonproliferative diabetic retinopathy with macular edema, left eye: Secondary | ICD-10-CM | POA: Diagnosis not present

## 2020-11-08 DIAGNOSIS — H35371 Puckering of macula, right eye: Secondary | ICD-10-CM

## 2020-11-08 DIAGNOSIS — E113311 Type 2 diabetes mellitus with moderate nonproliferative diabetic retinopathy with macular edema, right eye: Secondary | ICD-10-CM | POA: Diagnosis not present

## 2020-11-08 MED ORDER — AFLIBERCEPT 2MG/0.05ML IZ SOLN FOR KALEIDOSCOPE
2.0000 mg | INTRAVITREAL | Status: AC | PRN
  Administered 2020-11-08: 2 mg via INTRAVITREAL

## 2020-11-08 NOTE — Assessment & Plan Note (Signed)
CSME OD, vastly improved overall from onset yet still some residual changes some of which appear chronic at current 8-week follow-up post Eylea injection.  Some of this also may be chronic from minor epiretinal membrane thus repeat injection today at 8 weeks and extend interval examination next in the right eye to 3 months

## 2020-11-08 NOTE — Progress Notes (Signed)
11/08/2020     CHIEF COMPLAINT Patient presents for  Chief Complaint  Patient presents with   Retina Follow Up      HISTORY OF PRESENT ILLNESS: David Mendoza is a 80 y.o. male who presents to the clinic today for:   HPI     Retina Follow Up   Patient presents with  Diabetic Retinopathy.  In right eye.  This started 8 weeks ago.  Severity is mild.  Duration of 8 weeks.  Since onset it is gradually improving.        Comments   8 week , 4 days fu OD oct eylea OD. Pt states vision seems to be improving. Denies new FOL or floaters.       Last edited by Laurin Coder on 11/08/2020  8:00 AM.      Referring physician: Josetta Huddle, MD [address] [place],  [place] [address]  HISTORICAL INFORMATION:   Selected notes from the [facility]: No current outpatient medications on file. (Ophthalmic Drugs)   No current facility-administered medications for this visit. (Ophthalmic Drugs)   Current Outpatient Medications (Other)  Medication Sig   amLODipine (NORVASC) 5 MG tablet Take 5 mg by mouth daily.   glipiZIDE (GLUCOTROL) 5 MG tablet Take 5 mg by mouth 2 (two) times daily.   hydrochlorothiazide (HYDRODIURIL) 12.5 MG tablet Take 12.5 mg by mouth daily.   losartan (COZAAR) 100 MG tablet Take 100 mg by mouth daily.   metFORMIN (GLUCOPHAGE) 500 MG tablet Take 1,000 mg by mouth 2 (two) times daily with a meal.   omeprazole (PRILOSEC) 40 MG capsule Take 40 mg by mouth daily.   rosuvastatin (CRESTOR) 20 MG tablet Take 20 mg by mouth daily.   No current facility-administered medications for this visit. (Other)      REVIEW OF SYSTEMS:    ALLERGIES Allergies  Allergen Reactions   Sulfa Antibiotics Other (See Comments)    Doesn't remember     PAST MEDICAL HISTORY Past Medical History:  Diagnosis Date   Diabetes mellitus without complication ([place])    DVT (deep venous thrombosis) ([place])    Hyperlipemia     Hypertension    Nuclear sclerotic cataract of left eye 12/25/2019   Nuclear sclerotic cataract of right eye 12/25/2019   Peripheral neuropathy    Posterior vitreous detachment of left eye 12/25/2019   Sciatica    Past Surgical History:  Procedure Laterality Date   CRANIOTOMY Bilateral 04/07/2014   Procedure: BILATERAL CRANIOTOMY HEMATOMA EVACUATION SUBDURAL;  Surgeon: Karie Chimera, MD;  Location: [facility];  Service: Neurosurgery;  Laterality: Bilateral;    FAMILY HISTORY History reviewed. No pertinent family history.  SOCIAL HISTORY Social History   Tobacco Use   Smoking status: Never   Smokeless tobacco: Never         OPHTHALMIC EXAM:  Base Eye Exam     Visual Acuity (ETDRS)       Right Left   Dist [place] 20/25 -2 20/25         Tonometry (Tonopen, 8:03 AM)       Right Left   Pressure 8 8         Pupils       Pupils Dark Light APD   Right PERRL 3 2 None   Left PERRL 3 2 None         Extraocular Movement       Right  Left    Full Full         Neuro/Psych     Oriented x3: Yes   Mood/Affect: Normal         Dilation     Right eye: 1.0% Mydriacyl, 2.5% Phenylephrine @ 8:03 AM           Slit Lamp and Fundus Exam     External Exam       Right Left   External Normal Normal         Slit Lamp Exam       Right Left   Lids/Lashes Normal Normal   Conjunctiva/Sclera White and quiet White and quiet   Cornea Clear Clear   Anterior Chamber Deep and quiet Deep and quiet   Iris Round and reactive Round and reactive   Lens Centered posterior chamber intraocular lens Centered posterior chamber intraocular lens   Anterior Vitreous Normal Normal         Fundus Exam       Right Left   Posterior Vitreous Posterior vitreous detachment    Disc Normal    C/D Ratio 0.1    Macula Epiretinal membrane, Microaneurysms,, CSME OD not clinically detectable as it is improved, Hard drusen    Vessels NPDR- Moderate    Periphery Normal,  posterior pole choroidal nevus OD, no interval change, no high risk features, approximately 2.2 disc diameters and basal diameter             IMAGING AND PROCEDURES  Imaging and Procedures for 11/08/20  Intravitreal Injection, Pharmacologic Agent - OD - Right Eye       Time Out 11/08/2020. 8:37 AM. Confirmed correct patient, procedure, site, and patient consented.   Anesthesia Topical anesthesia was used. Anesthetic medications included Lidocaine 4%.   Procedure Preparation included Ofloxacin , 10% betadine to eyelids, 5% betadine to ocular surface. A 30 gauge needle was used.   Injection: 2 mg aflibercept 2 MG/0.05ML   Route: Intravitreal, Site: Right Eye   NDC: A3590391, Lot: [id number], Waste: 0 mL   Post-op Post injection exam found visual acuity of at least counting fingers. The patient tolerated the procedure well. There were no complications. The patient received written and verbal post procedure care education. Post injection medications included ocuflox.   Notes Cotton pledget, Q-tip inferotemporal scleral region, near limbus     OCT, Retina - OU - Both Eyes       Right Eye Quality was good. Scan locations included subfoveal. Central Foveal Thickness: 381. Progression has worsened. Findings include epiretinal membrane, abnormal foveal contour.   Left Eye Quality was good. Scan locations included subfoveal. Central Foveal Thickness: 349. Progression has been stable. Findings include cystoid macular edema, abnormal foveal contour.   Notes Involved CSME OD now  after injections intravitreal Eylea.  Currently at 8-week interval OD, and repeat injection today OD and likely extend interval exam next  OS, concomitant improvement in CSME likely from distance effect of the intravitreal Eylea OD.  Much improved again OS after direct injection 3 weeks prior, and will continue to observe and follow-up as scheduled OS             ASSESSMENT/PLAN:  Moderate  nonproliferative diabetic retinopathy of right eye with macular edema (HCC) CSME OD, vastly improved overall from onset yet still some residual changes some of which appear chronic at current 8-week follow-up post Eylea injection.  Some of this also may be chronic from minor epiretinal membrane thus repeat injection today at  8 weeks and extend interval examination next in the right eye to 3 months  Macular pucker, right eye Minor epiretinal membrane no impact on acuity  Moderate nonproliferative diabetic retinopathy of left eye with macular edema associated with type 2 diabetes mellitus (HCC) Overall improved CSME OS at 3-week interval follow-up is scheduled  Choroidal nevus of right eye No interval change in small choroidal nevus     ICD-10-CM   1. Moderate nonproliferative diabetic retinopathy of right eye with macular edema associated with type 2 diabetes mellitus (HCC)  W10.9323 Intravitreal Injection, Pharmacologic Agent - OD - Right Eye    OCT, Retina - OU - Both Eyes    aflibercept (EYLEA) SOLN 2 mg    2. Macular pucker, right eye  H35.371     3. Moderate nonproliferative diabetic retinopathy of left eye with macular edema associated with type 2 diabetes mellitus ([place])  F57.3220     4. Choroidal nevus of right eye  D31.31       1.  OD, CSME vastly improved currently at 8-week post injection, repeat injection today and extend interval of examination next to 3 months  2.  3.  Ophthalmic Meds Ordered this visit:  Meds ordered this encounter  Medications   aflibercept (EYLEA) SOLN 2 mg       Return in about 3 months (around 02/08/2021) for dilate, OD, EYLEA OCT.  There are no Patient Instructions on file for this visit.   Explained the diagnoses, plan, and follow up with the patient and they expressed understanding.  Patient expressed understanding of the importance of proper follow up care.   Clent Demark Aynslee Mulhall M.D. Diseases & Surgery of the Retina and Vitreous Retina &  Diabetic [place] 11/08/20     Abbreviations: M myopia (nearsighted); A astigmatism; H hyperopia (farsighted); P presbyopia; Mrx spectacle prescription;  CTL contact lenses; OD right eye; OS left eye; OU both eyes  XT exotropia; ET esotropia; PEK punctate epithelial keratitis; PEE punctate epithelial erosions; DES dry eye syndrome; MGD meibomian gland dysfunction; ATs artificial tears; PFAT's preservative free artificial tears; [place] nuclear sclerotic cataract; PSC posterior subcapsular cataract; ERM epi-retinal membrane; PVD posterior vitreous detachment; RD retinal detachment; DM diabetes mellitus; DR diabetic retinopathy; NPDR non-proliferative diabetic retinopathy; PDR proliferative diabetic retinopathy; CSME clinically significant macular edema; DME diabetic macular edema; dbh dot blot hemorrhages; CWS cotton wool spot; POAG primary open angle glaucoma; C/D cup-to-disc ratio; HVF humphrey visual field; GVF goldmann visual field; OCT optical coherence tomography; IOP intraocular pressure; BRVO Branch retinal vein occlusion; CRVO central retinal vein occlusion; CRAO central retinal artery occlusion; BRAO branch retinal artery occlusion; RT retinal tear; SB scleral buckle; PPV pars plana vitrectomy; VH Vitreous hemorrhage; PRP panretinal laser photocoagulation; IVK intravitreal kenalog; VMT vitreomacular traction; MH Macular hole;  NVD neovascularization of the disc; NVE neovascularization elsewhere; AREDS age related eye disease study; ARMD age related macular degeneration; POAG primary open angle glaucoma; EBMD epithelial/anterior basement membrane dystrophy; ACIOL anterior chamber intraocular lens; IOL intraocular lens; PCIOL posterior chamber intraocular lens; Phaco/IOL phacoemulsification with intraocular lens placement; [place] photorefractive keratectomy; LASIK laser assisted in situ keratomileusis; HTN hypertension; DM diabetes mellitus; COPD chronic obstructive pulmonary disease

## 2020-11-08 NOTE — Assessment & Plan Note (Signed)
No interval change in small choroidal nevus

## 2020-11-08 NOTE — Assessment & Plan Note (Signed)
Minor epiretinal membrane no impact on acuity

## 2020-11-08 NOTE — Assessment & Plan Note (Signed)
Overall improved CSME OS at 3-week interval follow-up is scheduled

## 2020-11-11 DIAGNOSIS — E1165 Type 2 diabetes mellitus with hyperglycemia: Secondary | ICD-10-CM | POA: Diagnosis not present

## 2020-11-11 DIAGNOSIS — E782 Mixed hyperlipidemia: Secondary | ICD-10-CM | POA: Diagnosis not present

## 2020-11-11 DIAGNOSIS — I10 Essential (primary) hypertension: Secondary | ICD-10-CM | POA: Diagnosis not present

## 2020-11-11 DIAGNOSIS — E11319 Type 2 diabetes mellitus with unspecified diabetic retinopathy without macular edema: Secondary | ICD-10-CM | POA: Diagnosis not present

## 2020-11-11 DIAGNOSIS — E1140 Type 2 diabetes mellitus with diabetic neuropathy, unspecified: Secondary | ICD-10-CM | POA: Diagnosis not present

## 2020-11-11 DIAGNOSIS — K219 Gastro-esophageal reflux disease without esophagitis: Secondary | ICD-10-CM | POA: Diagnosis not present

## 2020-11-11 DIAGNOSIS — E1142 Type 2 diabetes mellitus with diabetic polyneuropathy: Secondary | ICD-10-CM | POA: Diagnosis not present

## 2020-11-11 DIAGNOSIS — E1121 Type 2 diabetes mellitus with diabetic nephropathy: Secondary | ICD-10-CM | POA: Diagnosis not present

## 2020-11-30 DIAGNOSIS — E559 Vitamin D deficiency, unspecified: Secondary | ICD-10-CM | POA: Diagnosis not present

## 2020-11-30 DIAGNOSIS — E11319 Type 2 diabetes mellitus with unspecified diabetic retinopathy without macular edema: Secondary | ICD-10-CM | POA: Diagnosis not present

## 2020-11-30 DIAGNOSIS — Z0000 Encounter for general adult medical examination without abnormal findings: Secondary | ICD-10-CM | POA: Diagnosis not present

## 2020-11-30 DIAGNOSIS — E1121 Type 2 diabetes mellitus with diabetic nephropathy: Secondary | ICD-10-CM | POA: Diagnosis not present

## 2020-11-30 DIAGNOSIS — R202 Paresthesia of skin: Secondary | ICD-10-CM | POA: Diagnosis not present

## 2020-11-30 DIAGNOSIS — Z7984 Long term (current) use of oral hypoglycemic drugs: Secondary | ICD-10-CM | POA: Diagnosis not present

## 2020-11-30 DIAGNOSIS — Z1389 Encounter for screening for other disorder: Secondary | ICD-10-CM | POA: Diagnosis not present

## 2020-11-30 DIAGNOSIS — E782 Mixed hyperlipidemia: Secondary | ICD-10-CM | POA: Diagnosis not present

## 2020-11-30 DIAGNOSIS — I10 Essential (primary) hypertension: Secondary | ICD-10-CM | POA: Diagnosis not present

## 2020-11-30 DIAGNOSIS — Z23 Encounter for immunization: Secondary | ICD-10-CM | POA: Diagnosis not present

## 2020-11-30 DIAGNOSIS — Z79899 Other long term (current) drug therapy: Secondary | ICD-10-CM | POA: Diagnosis not present

## 2020-11-30 DIAGNOSIS — E1165 Type 2 diabetes mellitus with hyperglycemia: Secondary | ICD-10-CM | POA: Diagnosis not present

## 2020-11-30 DIAGNOSIS — E1140 Type 2 diabetes mellitus with diabetic neuropathy, unspecified: Secondary | ICD-10-CM | POA: Diagnosis not present

## 2020-12-14 ENCOUNTER — Other Ambulatory Visit: Payer: Self-pay

## 2020-12-14 ENCOUNTER — Encounter (INDEPENDENT_AMBULATORY_CARE_PROVIDER_SITE_OTHER): Payer: Self-pay | Admitting: Ophthalmology

## 2020-12-14 ENCOUNTER — Ambulatory Visit (INDEPENDENT_AMBULATORY_CARE_PROVIDER_SITE_OTHER): Payer: Medicare Other | Admitting: Ophthalmology

## 2020-12-14 DIAGNOSIS — E113311 Type 2 diabetes mellitus with moderate nonproliferative diabetic retinopathy with macular edema, right eye: Secondary | ICD-10-CM

## 2020-12-14 DIAGNOSIS — E113312 Type 2 diabetes mellitus with moderate nonproliferative diabetic retinopathy with macular edema, left eye: Secondary | ICD-10-CM | POA: Diagnosis not present

## 2020-12-14 MED ORDER — AFLIBERCEPT 2MG/0.05ML IZ SOLN FOR KALEIDOSCOPE
2.0000 mg | INTRAVITREAL | Status: AC | PRN
  Administered 2020-12-14: 2 mg via INTRAVITREAL

## 2020-12-14 NOTE — Progress Notes (Signed)
12/14/2020     CHIEF COMPLAINT Patient presents for  Chief Complaint  Patient presents with   Retina Follow Up      HISTORY OF PRESENT ILLNESS: David Mendoza is a 80 y.o. male who presents to the clinic today for:   HPI     Retina Follow Up   Patient presents with  Diabetic Retinopathy.  In left eye.  This started 8 weeks ago.  Severity is mild.  Duration of 8 weeks.  Since onset it is gradually improving.        Comments   8 WEEKS fu OS oct EYLEA OS. Patient states vision is stable and unchanged since last visit. Denies any new floaters or FOL.        Last edited by Laurin Coder on 12/14/2020  8:47 AM.      Referring physician: Josetta Huddle, MD [address] [place] [place],  [place] [address]  HISTORICAL INFORMATION:   Selected notes from the [facility]: No current outpatient medications on file. (Ophthalmic Drugs)   No current facility-administered medications for this visit. (Ophthalmic Drugs)   Current Outpatient Medications (Other)  Medication Sig   amLODipine (NORVASC) 5 MG tablet Take 5 mg by mouth daily.   glipiZIDE (GLUCOTROL) 5 MG tablet Take 5 mg by mouth 2 (two) times daily.   hydrochlorothiazide (HYDRODIURIL) 12.5 MG tablet Take 12.5 mg by mouth daily.   losartan (COZAAR) 100 MG tablet Take 100 mg by mouth daily.   metFORMIN (GLUCOPHAGE) 500 MG tablet Take 1,000 mg by mouth 2 (two) times daily with a meal.   omeprazole (PRILOSEC) 40 MG capsule Take 40 mg by mouth daily.   rosuvastatin (CRESTOR) 20 MG tablet Take 20 mg by mouth daily.   No current facility-administered medications for this visit. (Other)      REVIEW OF SYSTEMS:    ALLERGIES Allergies  Allergen Reactions   Sulfa Antibiotics Other (See Comments)    Doesn't remember     PAST MEDICAL HISTORY Past Medical History:  Diagnosis Date   Diabetes mellitus without complication ([place])    DVT (deep venous thrombosis) ([place])     Hyperlipemia    Hypertension    Nuclear sclerotic cataract of left eye 12/25/2019   Nuclear sclerotic cataract of right eye 12/25/2019   Peripheral neuropathy    Posterior vitreous detachment of left eye 12/25/2019   Sciatica    Past Surgical History:  Procedure Laterality Date   CRANIOTOMY Bilateral 04/07/2014   Procedure: BILATERAL CRANIOTOMY HEMATOMA EVACUATION SUBDURAL;  Surgeon: Karie Chimera, MD;  Location: [facility];  Service: Neurosurgery;  Laterality: Bilateral;    FAMILY HISTORY History reviewed. No pertinent family history.  SOCIAL HISTORY Social History   Tobacco Use   Smoking status: Never   Smokeless tobacco: Never         OPHTHALMIC EXAM:  Base Eye Exam     Visual Acuity (ETDRS)       Right Left   Dist [place] 20/25 -1 20/30 +1         Tonometry (Tonopen, 8:51 AM)       Right Left   Pressure 9 9         Pupils       Pupils Dark Light APD   Right PERRL 3 2 None   Left PERRL 3 2 None         Extraocular Movement  Right Left    Full Full         Neuro/Psych     Oriented x3: Yes   Mood/Affect: Normal         Dilation     Left eye: 1.0% Mydriacyl, 2.5% Phenylephrine @ 8:51 AM           Slit Lamp and Fundus Exam     External Exam       Right Left   External Normal Normal         Slit Lamp Exam       Right Left   Lids/Lashes Normal Normal   Conjunctiva/Sclera White and quiet White and quiet   Cornea Clear Clear   Anterior Chamber Deep and quiet Deep and quiet   Iris Round and reactive Round and reactive   Lens Centered posterior chamber intraocular lens Centered posterior chamber intraocular lens   Anterior Vitreous Normal Normal         Fundus Exam       Right Left   Posterior Vitreous  Posterior vitreous detachment   Disc  Normal   C/D Ratio  0.1   Macula  Microaneurysms, epiretinal membrane, CSME OS not clinically detectable, Hard drusen   Vessels  NPDR- Moderate   Periphery  Good  retinopexy at 2, 2:30            IMAGING AND PROCEDURES  Imaging and Procedures for 12/14/20  Intravitreal Injection, Pharmacologic Agent - OS - Left Eye       Time Out 12/14/2020. 9:15 AM. Confirmed correct patient, procedure, site, and patient consented.   Anesthesia Topical anesthesia was used. Anesthetic medications included Lidocaine 4%.   Procedure Preparation included Tobramycin 0.3%, 10% betadine to eyelids, 5% betadine to ocular surface. A 30 gauge needle was used.   Injection: 2 mg aflibercept 2 MG/0.05ML   Route: Intravitreal, Site: Left Eye   NDC: [id number], Waste: 0 mL   Post-op Post injection exam found visual acuity of at least counting fingers. The patient tolerated the procedure well. There were no complications. The patient received written and verbal post procedure care education. Post injection medications included ocuflox.      OCT, Retina - OU - Both Eyes       Right Eye Quality was good. Scan locations included subfoveal. Central Foveal Thickness: 372. Progression has improved. Findings include epiretinal membrane, abnormal foveal contour.   Left Eye Quality was good. Scan locations included subfoveal. Central Foveal Thickness: 346. Progression has improved. Findings include cystoid macular edema, abnormal foveal contour.   Notes Involved CSME OD now  after injections intravitreal Eylea.  Currently at 5-week interval OD, a   OS, concomitant improvement in CSME likely from distance effect of the intravitreal Eylea OD.  Much improved again OS after direct injection 8 weeks prior, and we will repeat injection Eylea OS today and extend interval examination left eye to 12-week             ASSESSMENT/PLAN:  Moderate nonproliferative diabetic retinopathy of left eye with macular edema associated with type 2 diabetes mellitus (HCC) Minor CSME OS remains today at current 8-week follow-up post Eylea.  We will repeat injection today and extend  interval examination next left eye to 12-week  Moderate nonproliferative diabetic retinopathy of right eye with macular edema ([place]) OD remains improved overall we will follow-up as scheduled     ICD-10-CM   1. Moderate nonproliferative diabetic retinopathy of left eye with macular edema associated with type 2  diabetes mellitus (HCC)  E99.3716 Intravitreal Injection, Pharmacologic Agent - OS - Left Eye    OCT, Retina - OU - Both Eyes    aflibercept (EYLEA) SOLN 2 mg    2. Moderate nonproliferative diabetic retinopathy of right eye with macular edema associated with type 2 diabetes mellitus (HCC)  E11.3311       1.  OS, improved overall CSME responsive now to intravitreal Eylea.  At 8-week interval.  Stable acuity.  We will repeat injection today and follow-up next in 12 weeks  2.  OD also improved CSME will follow-up as scheduled  3.  Ophthalmic Meds Ordered this visit:  Meds ordered this encounter  Medications   aflibercept (EYLEA) SOLN 2 mg       Return in about 12 weeks (around 03/08/2021) for dilate, OS, EYLEA OCT.  There are no Patient Instructions on file for this visit.   Explained the diagnoses, plan, and follow up with the patient and they expressed understanding.  Patient expressed understanding of the importance of proper follow up care.   Clent Demark Pola Furno M.D. Diseases & Surgery of the Retina and Vitreous Retina & Diabetic [facility] 12/14/20     Abbreviations: M myopia (nearsighted); A astigmatism; H hyperopia (farsighted); P presbyopia; Mrx spectacle prescription;  CTL contact lenses; OD right eye; OS left eye; OU both eyes  XT exotropia; ET esotropia; PEK punctate epithelial keratitis; PEE punctate epithelial erosions; DES dry eye syndrome; MGD meibomian gland dysfunction; ATs artificial tears; PFAT's preservative free artificial tears; [place] nuclear sclerotic cataract; PSC posterior subcapsular cataract; ERM epi-retinal membrane; PVD posterior vitreous  detachment; RD retinal detachment; DM diabetes mellitus; DR diabetic retinopathy; NPDR non-proliferative diabetic retinopathy; PDR proliferative diabetic retinopathy; CSME clinically significant macular edema; DME diabetic macular edema; dbh dot blot hemorrhages; CWS cotton wool spot; POAG primary open angle glaucoma; C/D cup-to-disc ratio; HVF humphrey visual field; GVF goldmann visual field; OCT optical coherence tomography; IOP intraocular pressure; BRVO Branch retinal vein occlusion; CRVO central retinal vein occlusion; CRAO central retinal artery occlusion; BRAO branch retinal artery occlusion; RT retinal tear; SB scleral buckle; PPV pars plana vitrectomy; VH Vitreous hemorrhage; PRP panretinal laser photocoagulation; IVK intravitreal kenalog; VMT vitreomacular traction; MH Macular hole;  NVD neovascularization of the disc; NVE neovascularization elsewhere; AREDS age related eye disease study; ARMD age related macular degeneration; POAG primary open angle glaucoma; EBMD epithelial/anterior basement membrane dystrophy; ACIOL anterior chamber intraocular lens; IOL intraocular lens; PCIOL posterior chamber intraocular lens; Phaco/IOL phacoemulsification with intraocular lens placement; [place] photorefractive keratectomy; LASIK laser assisted in situ keratomileusis; HTN hypertension; DM diabetes mellitus; COPD chronic obstructive pulmonary disease

## 2020-12-14 NOTE — Assessment & Plan Note (Signed)
OD remains improved overall we will follow-up as scheduled

## 2020-12-14 NOTE — Assessment & Plan Note (Signed)
Minor CSME OS remains today at current 8-week follow-up post Eylea.  We will repeat injection today and extend interval examination next left eye to 12-week

## 2020-12-22 ENCOUNTER — Other Ambulatory Visit (HOSPITAL_BASED_OUTPATIENT_CLINIC_OR_DEPARTMENT_OTHER): Payer: Self-pay

## 2020-12-22 ENCOUNTER — Other Ambulatory Visit: Payer: Self-pay

## 2020-12-22 ENCOUNTER — Ambulatory Visit: Payer: Medicare Other | Attending: Internal Medicine

## 2020-12-22 DIAGNOSIS — Z23 Encounter for immunization: Secondary | ICD-10-CM

## 2020-12-22 MED ORDER — PFIZER COVID-19 VAC BIVALENT 30 MCG/0.3ML IM SUSP
INTRAMUSCULAR | 0 refills | Status: DC
  Filled 2020-12-22: qty 0.3, 1d supply, fill #0

## 2020-12-22 NOTE — Progress Notes (Signed)
° °  Covid-19 Vaccination Clinic  Name:  David Mendoza    MRN: [id number] DOB: 03/21/1940  12/22/2020  [name] was observed post Covid-19 immunization for 15 minutes without incident. He was provided with Vaccine Information Sheet and instruction to access the V-Safe system.   [name] was instructed to call 911 with any severe reactions post vaccine: Difficulty breathing  Swelling of face and throat  A fast heartbeat  A bad rash all over body  Dizziness and weakness   Immunizations Administered     Name Date Dose VIS Date Route   Pfizer Covid-19 Vaccine Bivalent Booster 12/22/2020  3:29 PM 0.3 mL 09/08/2020 Intramuscular   Manufacturer: [place]   Lot: [id number]   [place]: [phone]

## 2021-01-13 DIAGNOSIS — I10 Essential (primary) hypertension: Secondary | ICD-10-CM | POA: Diagnosis not present

## 2021-02-08 ENCOUNTER — Encounter (INDEPENDENT_AMBULATORY_CARE_PROVIDER_SITE_OTHER): Payer: Self-pay | Admitting: Ophthalmology

## 2021-02-08 ENCOUNTER — Ambulatory Visit (INDEPENDENT_AMBULATORY_CARE_PROVIDER_SITE_OTHER): Payer: Medicare Other | Admitting: Ophthalmology

## 2021-02-08 ENCOUNTER — Other Ambulatory Visit: Payer: Self-pay

## 2021-02-08 DIAGNOSIS — D3131 Benign neoplasm of right choroid: Secondary | ICD-10-CM

## 2021-02-08 DIAGNOSIS — E113311 Type 2 diabetes mellitus with moderate nonproliferative diabetic retinopathy with macular edema, right eye: Secondary | ICD-10-CM | POA: Diagnosis not present

## 2021-02-08 DIAGNOSIS — E113312 Type 2 diabetes mellitus with moderate nonproliferative diabetic retinopathy with macular edema, left eye: Secondary | ICD-10-CM | POA: Diagnosis not present

## 2021-02-08 DIAGNOSIS — H35371 Puckering of macula, right eye: Secondary | ICD-10-CM

## 2021-02-08 MED ORDER — AFLIBERCEPT 2MG/0.05ML IZ SOLN FOR KALEIDOSCOPE
2.0000 mg | INTRAVITREAL | Status: AC | PRN
  Administered 2021-02-08: 2 mg via INTRAVITREAL

## 2021-02-08 NOTE — Assessment & Plan Note (Signed)
Improved OS currently at 8-week interval post injection of antivegF, will follow-up as scheduled OS

## 2021-02-08 NOTE — Assessment & Plan Note (Signed)
CSME OD center involved, recurrent at 48-month interval.  On Eylea which been very effective in the past of controlling CSME.  Repeat injection today and follow-up again in 8 to 9 weeks

## 2021-02-08 NOTE — Assessment & Plan Note (Signed)
Stable, no high risk features

## 2021-02-08 NOTE — Progress Notes (Signed)
02/08/2021     CHIEF COMPLAINT Patient presents for  Chief Complaint  Patient presents with   Retina Follow Up      HISTORY OF PRESENT ILLNESS: David Mendoza is a 81 y.o. male who presents to the clinic today for:   HPI     Retina Follow Up           Diagnosis: Diabetic Retinopathy   Onset: 3 months ago   Severity: mild   Duration: 3 months   Course: stable         Comments   3 mos fu OD oct Eylea OD. Pt states "the past couple days my right eye has not been as sharp as it usually is. Floaters seem to be less." Denies new FOL. Pt states left eye is good.      Last edited by Laurin Coder on 02/08/2021  8:08 AM.      Referring physician: Josetta Huddle, MD [address],  [place] [phone]  HISTORICAL INFORMATION:   Selected notes from the [facility]: No current outpatient medications on file. (Ophthalmic Drugs)   No current facility-administered medications for this visit. (Ophthalmic Drugs)   Current Outpatient Medications (Other)  Medication Sig   amLODipine (NORVASC) 5 MG tablet Take 5 mg by mouth daily.   COVID-19 mRNA bivalent vaccine, Pfizer, (PFIZER COVID-19 VAC BIVALENT) injection Inject into the muscle.   glipiZIDE (GLUCOTROL) 5 MG tablet Take 5 mg by mouth 2 (two) times daily.   hydrochlorothiazide (HYDRODIURIL) 12.5 MG tablet Take 12.5 mg by mouth daily.   losartan (COZAAR) 100 MG tablet Take 100 mg by mouth daily.   metFORMIN (GLUCOPHAGE) 500 MG tablet Take 1,000 mg by mouth 2 (two) times daily with a meal.   omeprazole (PRILOSEC) 40 MG capsule Take 40 mg by mouth daily.   rosuvastatin (CRESTOR) 20 MG tablet Take 20 mg by mouth daily.   No current facility-administered medications for this visit. (Other)      REVIEW OF SYSTEMS:    ALLERGIES Allergies  Allergen Reactions   Sulfa Antibiotics Other (See Comments)    Doesn't remember     PAST MEDICAL HISTORY Past  Medical History:  Diagnosis Date   DVT (deep venous thrombosis) ([place])    Hyperlipemia    Hypertension    Nuclear sclerotic cataract of left eye 12/25/2019   Nuclear sclerotic cataract of right eye 12/25/2019   Peripheral neuropathy    Posterior vitreous detachment of left eye 12/25/2019   Sciatica    Past Surgical History:  Procedure Laterality Date   CRANIOTOMY Bilateral 04/07/2014   Procedure: BILATERAL CRANIOTOMY HEMATOMA EVACUATION SUBDURAL;  Surgeon: Karie Chimera, MD;  Location: [facility];  Service: Neurosurgery;  Laterality: Bilateral;    FAMILY HISTORY History reviewed. No pertinent family history.  SOCIAL HISTORY Social History   Tobacco Use   Smoking status: Never   Smokeless tobacco: Never         OPHTHALMIC EXAM:  Base Eye Exam     Visual Acuity (ETDRS)       Right Left   Dist [place] 20/40 -1 20/30 -2   Dist ph [place] NI          Tonometry (Tonopen, 8:11 AM)       Right Left   Pressure 15 9         Pupils       Pupils Dark Light APD  Right PERRL 3 2 None   Left PERRL 3 2 None         Visual Fields       Left Right    Full Full         Extraocular Movement       Right Left    Full Full         Neuro/Psych     Oriented x3: Yes   Mood/Affect: Normal         Dilation     Right eye: 1.0% Mydriacyl, 2.5% Phenylephrine @ 8:11 AM           Slit Lamp and Fundus Exam     External Exam       Right Left   External Normal Normal         Slit Lamp Exam       Right Left   Lids/Lashes Normal Normal   Conjunctiva/Sclera White and quiet White and quiet   Cornea Clear Clear   Anterior Chamber Deep and quiet Deep and quiet   Iris Round and reactive Round and reactive   Lens Centered posterior chamber intraocular lens Centered posterior chamber intraocular lens   Anterior Vitreous Normal Normal         Fundus Exam       Right Left   Posterior Vitreous Posterior vitreous detachment    Disc Normal    C/D Ratio  0.1    Macula Epiretinal membrane, Microaneurysms, CSME OD not clinically detectable as it is improved, Hard drusen    Vessels NPDR- Moderate    Periphery Normal, posterior pole choroidal nevus OD, no interval change, no high risk features, approximately 2.2 disc diameters and basal diameter             IMAGING AND PROCEDURES  Imaging and Procedures for 02/08/21  Intravitreal Injection, Pharmacologic Agent - OD - Right Eye       Time Out 02/08/2021. 9:02 AM. Confirmed correct patient, procedure, site, and patient consented.   Anesthesia Topical anesthesia was used. Anesthetic medications included Lidocaine 4%.   Procedure Preparation included Ofloxacin , 10% betadine to eyelids, 5% betadine to ocular surface. A 30 gauge needle was used.   Injection: 2 mg aflibercept 2 MG/0.05ML   Route: Intravitreal, Site: Right Eye   NDC: [id number], Lot: [id number], Waste: 0 mL   Post-op Post injection exam found visual acuity of at least counting fingers. The patient tolerated the procedure well. There were no complications. The patient received written and verbal post procedure care education. Post injection medications included ocuflox.   Notes Cotton pledget, Q-tip inferotemporal scleral region, near limbus     OCT, Retina - OU - Both Eyes       Right Eye Quality was good. Scan locations included subfoveal. Central Foveal Thickness: 459. Progression has worsened. Findings include epiretinal membrane, abnormal foveal contour, retinal drusen .   Left Eye Quality was good. Scan locations included subfoveal. Central Foveal Thickness: 333. Progression has improved. Findings include cystoid macular edema, abnormal foveal contour, retinal drusen .   Notes Center involved CSME OD now  after injections intravitreal Eylea.  Currently at 3 months interval OD, slightly worse, will repeat injection today and again in 8 weeks evaluate   OS, concomitant improvement in CSME likely from  distance effect of the intravitreal Eylea OD.  Much improved again OS after direct injection 8 weeks prior,              ASSESSMENT/PLAN:  Moderate nonproliferative diabetic retinopathy of right eye with macular edema (HCC) CSME OD center involved, recurrent at 37-month interval.  On Eylea which been very effective in the past of controlling CSME.  Repeat injection today and follow-up again in 8 to 9 weeks  Moderate nonproliferative diabetic retinopathy of left eye with macular edema associated with type 2 diabetes mellitus ([place]) Improved OS currently at 8-week interval post injection of antivegF, will follow-up as scheduled OS  Macular pucker, right eye Recurrent CSME not likely from epiretinal membrane.  Thus we will continue to monitor the ERM  Choroidal nevus of right eye Stable, no high risk features     ICD-10-CM   1. Moderate nonproliferative diabetic retinopathy of right eye with macular edema associated with type 2 diabetes mellitus (HCC)  E33.2951 Intravitreal Injection, Pharmacologic Agent - OD - Right Eye    OCT, Retina - OU - Both Eyes    aflibercept (EYLEA) SOLN 2 mg    2. Moderate nonproliferative diabetic retinopathy of left eye with macular edema associated with type 2 diabetes mellitus ([place])  O84.1660     3. Macular pucker, right eye  H35.371     4. Choroidal nevus of right eye  D31.31       1.  OD center involved CSME has recurred at 84-month interval.  We will repeat injection today to recover center involved CSME and follow-up in 8 to 9-week  Not likely that the ERM is triggering recurrence of CSME.  Thus we will continue to monitor the and follow the ERM  2.  3.  Ophthalmic Meds Ordered this visit:  Meds ordered this encounter  Medications   aflibercept (EYLEA) SOLN 2 mg       Return in about 8 weeks (around 04/05/2021) for dilate, OD, EYLEA OCT.  There are no Patient Instructions on file for this visit.   Explained the diagnoses, plan,  and follow up with the patient and they expressed understanding.  Patient expressed understanding of the importance of proper follow up care.   Clent Demark Siyana Erney M.D. Diseases & Surgery of the Retina and Vitreous Retina & Diabetic [place] 02/08/21     Abbreviations: M myopia (nearsighted); A astigmatism; H hyperopia (farsighted); P presbyopia; Mrx spectacle prescription;  CTL contact lenses; OD right eye; OS left eye; OU both eyes  XT exotropia; ET esotropia; PEK punctate epithelial keratitis; PEE punctate epithelial erosions; DES dry eye syndrome; MGD meibomian gland dysfunction; ATs artificial tears; PFAT's preservative free artificial tears; [place] nuclear sclerotic cataract; PSC posterior subcapsular cataract; ERM epi-retinal membrane; PVD posterior vitreous detachment; RD retinal detachment; DM diabetes mellitus; DR diabetic retinopathy; NPDR non-proliferative diabetic retinopathy; PDR proliferative diabetic retinopathy; CSME clinically significant macular edema; DME diabetic macular edema; dbh dot blot hemorrhages; CWS cotton wool spot; POAG primary open angle glaucoma; C/D cup-to-disc ratio; HVF humphrey visual field; GVF goldmann visual field; OCT optical coherence tomography; IOP intraocular pressure; BRVO Branch retinal vein occlusion; CRVO central retinal vein occlusion; CRAO central retinal artery occlusion; BRAO branch retinal artery occlusion; RT retinal tear; SB scleral buckle; PPV pars plana vitrectomy; VH Vitreous hemorrhage; PRP panretinal laser photocoagulation; IVK intravitreal kenalog; VMT vitreomacular traction; MH Macular hole;  NVD neovascularization of the disc; NVE neovascularization elsewhere; AREDS age related eye disease study; ARMD age related macular degeneration; POAG primary open angle glaucoma; EBMD epithelial/anterior basement membrane dystrophy; ACIOL anterior chamber intraocular lens; IOL intraocular lens; PCIOL posterior chamber intraocular lens; Phaco/IOL  phacoemulsification with intraocular lens placement; PRK photorefractive keratectomy; LASIK  laser assisted in situ keratomileusis; HTN hypertension; DM diabetes mellitus; COPD chronic obstructive pulmonary disease

## 2021-02-08 NOTE — Assessment & Plan Note (Signed)
Recurrent CSME not likely from epiretinal membrane.  Thus we will continue to monitor the ERM

## 2021-02-17 DIAGNOSIS — E1121 Type 2 diabetes mellitus with diabetic nephropathy: Secondary | ICD-10-CM | POA: Diagnosis not present

## 2021-02-17 DIAGNOSIS — E1142 Type 2 diabetes mellitus with diabetic polyneuropathy: Secondary | ICD-10-CM | POA: Diagnosis not present

## 2021-02-17 DIAGNOSIS — I10 Essential (primary) hypertension: Secondary | ICD-10-CM | POA: Diagnosis not present

## 2021-02-17 DIAGNOSIS — E1165 Type 2 diabetes mellitus with hyperglycemia: Secondary | ICD-10-CM | POA: Diagnosis not present

## 2021-03-08 ENCOUNTER — Other Ambulatory Visit: Payer: Self-pay

## 2021-03-08 ENCOUNTER — Encounter (INDEPENDENT_AMBULATORY_CARE_PROVIDER_SITE_OTHER): Payer: Self-pay | Admitting: Ophthalmology

## 2021-03-08 ENCOUNTER — Ambulatory Visit (INDEPENDENT_AMBULATORY_CARE_PROVIDER_SITE_OTHER): Payer: Medicare Other | Admitting: Ophthalmology

## 2021-03-08 DIAGNOSIS — E113312 Type 2 diabetes mellitus with moderate nonproliferative diabetic retinopathy with macular edema, left eye: Secondary | ICD-10-CM | POA: Diagnosis not present

## 2021-03-08 DIAGNOSIS — E113311 Type 2 diabetes mellitus with moderate nonproliferative diabetic retinopathy with macular edema, right eye: Secondary | ICD-10-CM | POA: Diagnosis not present

## 2021-03-08 DIAGNOSIS — H35371 Puckering of macula, right eye: Secondary | ICD-10-CM

## 2021-03-08 MED ORDER — AFLIBERCEPT 2MG/0.05ML IZ SOLN FOR KALEIDOSCOPE
2.0000 mg | INTRAVITREAL | Status: AC | PRN
  Administered 2021-03-08: 2 mg via INTRAVITREAL

## 2021-03-08 NOTE — Progress Notes (Signed)
03/08/2021     CHIEF COMPLAINT Patient presents for  Chief Complaint  Patient presents with   Diabetic Retinopathy with Macular Edema      HISTORY OF PRESENT ILLNESS: David Mendoza is a 81 y.o. male who presents to the clinic today for:   HPI   12 weeks dilate OS, Eylea OS, OCT. Patient states vision is stable and unchanged since last visit. Denies any new floaters or FOL.  Last edited by Laurin Coder on 03/08/2021  9:20 AM.      Referring physician: Josetta Huddle, MD [address],  [place] [address]  HISTORICAL INFORMATION:   Selected notes from the [facility]: No current outpatient medications on file. (Ophthalmic Drugs)   No current facility-administered medications for this visit. (Ophthalmic Drugs)   Current Outpatient Medications (Other)  Medication Sig   amLODipine (NORVASC) 5 MG tablet Take 5 mg by mouth daily.   COVID-19 mRNA bivalent vaccine, Pfizer, (PFIZER COVID-19 VAC BIVALENT) injection Inject into the muscle.   glipiZIDE (GLUCOTROL) 5 MG tablet Take 5 mg by mouth 2 (two) times daily.   hydrochlorothiazide (HYDRODIURIL) 12.5 MG tablet Take 12.5 mg by mouth daily.   losartan (COZAAR) 100 MG tablet Take 100 mg by mouth daily.   metFORMIN (GLUCOPHAGE) 500 MG tablet Take 1,000 mg by mouth 2 (two) times daily with a meal.   omeprazole (PRILOSEC) 40 MG capsule Take 40 mg by mouth daily.   rosuvastatin (CRESTOR) 20 MG tablet Take 20 mg by mouth daily.   No current facility-administered medications for this visit. (Other)      REVIEW OF SYSTEMS: ROS   Negative for: Constitutional, Gastrointestinal, Neurological, Skin, Genitourinary, Musculoskeletal, HENT, Endocrine, Cardiovascular, Eyes, Respiratory, Psychiatric, Allergic/Imm, Heme/Lymph Last edited by Hurman Horn, MD on 03/08/2021  9:44 AM.       ALLERGIES Allergies  Allergen Reactions   Sulfa Antibiotics Other (See Comments)     Doesn't remember     PAST MEDICAL HISTORY Past Medical History:  Diagnosis Date   DVT (deep venous thrombosis) ([place])    Hyperlipemia    Hypertension    Nuclear sclerotic cataract of left eye 12/25/2019   Nuclear sclerotic cataract of right eye 12/25/2019   Peripheral neuropathy    Posterior vitreous detachment of left eye 12/25/2019   Sciatica    Past Surgical History:  Procedure Laterality Date   CRANIOTOMY Bilateral 04/07/2014   Procedure: BILATERAL CRANIOTOMY HEMATOMA EVACUATION SUBDURAL;  Surgeon: Karie Chimera, MD;  Location: [facility];  Service: Neurosurgery;  Laterality: Bilateral;    FAMILY HISTORY History reviewed. No pertinent family history.  SOCIAL HISTORY Social History   Tobacco Use   Smoking status: Never   Smokeless tobacco: Never         OPHTHALMIC EXAM:  Base Eye Exam     Visual Acuity (ETDRS)       Right Left   Dist [place] 20/20 20/25 -1         Tonometry (Tonopen, 9:23 AM)       Right Left   Pressure 12 10         Pupils       Pupils APD   Right PERRL None   Left PERRL None         Extraocular Movement       Right Left    Full Full         Neuro/Psych  Oriented x3: Yes   Mood/Affect: Normal         Dilation     Left eye: 1.0% Mydriacyl, 2.5% Phenylephrine @ 9:23 AM           Slit Lamp and Fundus Exam     External Exam       Right Left   External Normal Normal         Slit Lamp Exam       Right Left   Lids/Lashes Normal Normal   Conjunctiva/Sclera White and quiet White and quiet   Cornea Clear Clear   Anterior Chamber Deep and quiet Deep and quiet   Iris Round and reactive Round and reactive   Lens Centered posterior chamber intraocular lens Centered posterior chamber intraocular lens   Anterior Vitreous Normal Normal         Fundus Exam       Right Left   Posterior Vitreous  Posterior vitreous detachment   Disc  Normal   C/D Ratio  0.1   Macula  Microaneurysms, epiretinal  membrane, CSME OS not clinically detectable, Hard drusen   Vessels  NPDR- Moderate   Periphery  Good retinopexy at 2, 2:30            IMAGING AND PROCEDURES  Imaging and Procedures for 03/08/21  OCT, Retina - OU - Both Eyes       Right Eye Quality was good. Scan locations included subfoveal. Central Foveal Thickness: 371. Progression has worsened. Findings include epiretinal membrane, abnormal foveal contour, retinal drusen .   Left Eye Quality was good. Scan locations included subfoveal. Central Foveal Thickness: 350. Progression has improved. Findings include cystoid macular edema, abnormal foveal contour, retinal drusen .   Notes Center involved CSME OD now  after injections intravitreal Eylea.  Currently at 4 weeks interval OD, improved, follow-up OD as scheduled  OS, concomitant improvement in CSME likely from distance effect of the intravitreal Eylea OD.  Much improved again OS after injection 12 weeks prior,      Intravitreal Injection, Pharmacologic Agent - OS - Left Eye       Time Out 03/08/2021. 9:49 AM. Confirmed correct patient, procedure, site, and patient consented.   Anesthesia Topical anesthesia was used. Anesthetic medications included Lidocaine 4%.   Procedure Preparation included Tobramycin 0.3%, 10% betadine to eyelids, 5% betadine to ocular surface. A 30 gauge needle was used.   Injection: 2 mg aflibercept 2 MG/0.05ML   Route: Intravitreal, Site: Left Eye   NDC: [id number], Lot: [id number], Waste: 0 mL   Post-op Post injection exam found visual acuity of at least counting fingers. The patient tolerated the procedure well. There were no complications. The patient received written and verbal post procedure care education. Post injection medications included ocuflox.              ASSESSMENT/PLAN:  Moderate nonproliferative diabetic retinopathy of left eye with macular edema associated with type 2 diabetes mellitus (HCC) OS, much less  improved CSME at 63-month interval, post injection, repeat injection today and extend interval examination to 4 months OS and likely observe neck  Moderate nonproliferative diabetic retinopathy of right eye with macular edema ([place]) OD currently at 29-month interval, much improved, will repeat examination right eye as scheduled  Macular pucker, right eye OD, minor foveal macular pucker, no impact on acuity continue observe     ICD-10-CM   1. Moderate nonproliferative diabetic retinopathy of left eye with macular edema associated with type 2 diabetes mellitus ([place])  P54.6568 OCT, Retina - OU - Both Eyes    Intravitreal Injection, Pharmacologic Agent - OS - Left Eye    aflibercept (EYLEA) SOLN 2 mg    2. Moderate nonproliferative diabetic retinopathy of right eye with macular edema associated with type 2 diabetes mellitus ([place])  E11.3311     3. Macular pucker, right eye  H35.371       1.  OU, moderate NPDR overall stable  2.  OU with CSME each vastly improving overall, OS currently at 44-month interval.  Repeat injection today to maintain dilate OU and 4 months no planned injection OS at that time  3.  OD dilate next, within 1 month likely injection and then follow-up next in 3 months same time with no S  Ophthalmic Meds Ordered this visit:  Meds ordered this encounter  Medications   aflibercept (EYLEA) SOLN 2 mg       Return in about 4 months (around 07/06/2021) for DILATE OU, OCT, no injection plan.  There are no Patient Instructions on file for this visit.   Explained the diagnoses, plan, and follow up with the patient and they expressed understanding.  Patient expressed understanding of the importance of proper follow up care.   Clent Demark Gregoria Selvy M.D. Diseases & Surgery of the Retina and Vitreous Retina & Diabetic [place] 03/08/21     Abbreviations: M myopia (nearsighted); A astigmatism; H hyperopia (farsighted); P presbyopia; Mrx spectacle prescription;  CTL contact  lenses; OD right eye; OS left eye; OU both eyes  XT exotropia; ET esotropia; PEK punctate epithelial keratitis; PEE punctate epithelial erosions; DES dry eye syndrome; MGD meibomian gland dysfunction; ATs artificial tears; PFAT's preservative free artificial tears; [place] nuclear sclerotic cataract; PSC posterior subcapsular cataract; ERM epi-retinal membrane; PVD posterior vitreous detachment; RD retinal detachment; DM diabetes mellitus; DR diabetic retinopathy; NPDR non-proliferative diabetic retinopathy; PDR proliferative diabetic retinopathy; CSME clinically significant macular edema; DME diabetic macular edema; dbh dot blot hemorrhages; CWS cotton wool spot; POAG primary open angle glaucoma; C/D cup-to-disc ratio; HVF humphrey visual field; GVF goldmann visual field; OCT optical coherence tomography; IOP intraocular pressure; BRVO Branch retinal vein occlusion; CRVO central retinal vein occlusion; CRAO central retinal artery occlusion; BRAO branch retinal artery occlusion; RT retinal tear; SB scleral buckle; PPV pars plana vitrectomy; VH Vitreous hemorrhage; PRP panretinal laser photocoagulation; IVK intravitreal kenalog; VMT vitreomacular traction; MH Macular hole;  NVD neovascularization of the disc; NVE neovascularization elsewhere; AREDS age related eye disease study; ARMD age related macular degeneration; POAG primary open angle glaucoma; EBMD epithelial/anterior basement membrane dystrophy; ACIOL anterior chamber intraocular lens; IOL intraocular lens; PCIOL posterior chamber intraocular lens; Phaco/IOL phacoemulsification with intraocular lens placement; [place] photorefractive keratectomy; LASIK laser assisted in situ keratomileusis; HTN hypertension; DM diabetes mellitus; COPD chronic obstructive pulmonary disease

## 2021-03-08 NOTE — Assessment & Plan Note (Signed)
OD currently at 55-month interval, much improved, will repeat examination right eye as scheduled

## 2021-03-08 NOTE — Assessment & Plan Note (Signed)
OS, much less improved CSME at 64-month interval, post injection, repeat injection today and extend interval examination to 4 months OS and likely observe neck

## 2021-03-08 NOTE — Assessment & Plan Note (Signed)
OD, minor foveal macular pucker, no impact on acuity continue observe

## 2021-03-14 DIAGNOSIS — E11319 Type 2 diabetes mellitus with unspecified diabetic retinopathy without macular edema: Secondary | ICD-10-CM | POA: Diagnosis not present

## 2021-03-14 DIAGNOSIS — I10 Essential (primary) hypertension: Secondary | ICD-10-CM | POA: Diagnosis not present

## 2021-04-05 ENCOUNTER — Other Ambulatory Visit: Payer: Self-pay

## 2021-04-05 ENCOUNTER — Encounter (INDEPENDENT_AMBULATORY_CARE_PROVIDER_SITE_OTHER): Payer: Self-pay | Admitting: Ophthalmology

## 2021-04-05 ENCOUNTER — Ambulatory Visit (INDEPENDENT_AMBULATORY_CARE_PROVIDER_SITE_OTHER): Payer: Medicare Other | Admitting: Ophthalmology

## 2021-04-05 DIAGNOSIS — H35371 Puckering of macula, right eye: Secondary | ICD-10-CM | POA: Diagnosis not present

## 2021-04-05 DIAGNOSIS — E113311 Type 2 diabetes mellitus with moderate nonproliferative diabetic retinopathy with macular edema, right eye: Secondary | ICD-10-CM

## 2021-04-05 DIAGNOSIS — E113312 Type 2 diabetes mellitus with moderate nonproliferative diabetic retinopathy with macular edema, left eye: Secondary | ICD-10-CM

## 2021-04-05 MED ORDER — AFLIBERCEPT 2MG/0.05ML IZ SOLN FOR KALEIDOSCOPE
2.0000 mg | INTRAVITREAL | Status: AC | PRN
  Administered 2021-04-05: 2 mg via INTRAVITREAL

## 2021-04-05 NOTE — Progress Notes (Addendum)
04/05/2021     CHIEF COMPLAINT Patient presents for  Chief Complaint  Patient presents with   Diabetic Retinopathy with Macular Edema      HISTORY OF PRESENT ILLNESS: David Mendoza is a 81 y.o. male who presents to the clinic today for:   HPI   8 weeks for dilate. OD EYLEA OCT. Pt states vision has been improving.  Pt states floaters mostly in the right eye but denies FOL.  Last edited by Silvestre Moment on 04/05/2021  8:26 AM.      Referring physician: Josetta Huddle, MD [address],  [place] [address]  HISTORICAL INFORMATION:   Selected notes from the [facility]: No current outpatient medications on file. (Ophthalmic Drugs)   No current facility-administered medications for this visit. (Ophthalmic Drugs)   Current Outpatient Medications (Other)  Medication Sig   amLODipine (NORVASC) 5 MG tablet Take 5 mg by mouth daily.   COVID-19 mRNA bivalent vaccine, Pfizer, (PFIZER COVID-19 VAC BIVALENT) injection Inject into the muscle.   glipiZIDE (GLUCOTROL) 5 MG tablet Take 5 mg by mouth 2 (two) times daily.   hydrochlorothiazide (HYDRODIURIL) 12.5 MG tablet Take 12.5 mg by mouth daily.   losartan (COZAAR) 100 MG tablet Take 100 mg by mouth daily.   metFORMIN (GLUCOPHAGE) 500 MG tablet Take 1,000 mg by mouth 2 (two) times daily with a meal.   omeprazole (PRILOSEC) 40 MG capsule Take 40 mg by mouth daily.   rosuvastatin (CRESTOR) 20 MG tablet Take 20 mg by mouth daily.   No current facility-administered medications for this visit. (Other)      REVIEW OF SYSTEMS: ROS   Negative for: Constitutional, Gastrointestinal, Neurological, Skin, Genitourinary, Musculoskeletal, HENT, Endocrine, Cardiovascular, Eyes, Respiratory, Psychiatric, Allergic/Imm, Heme/Lymph Last edited by Silvestre Moment on 04/05/2021  8:26 AM.       ALLERGIES Allergies  Allergen Reactions   Sulfa Antibiotics Other (See Comments)    Doesn't remember      PAST MEDICAL HISTORY Past Medical History:  Diagnosis Date   DVT (deep venous thrombosis) ([place])    Hyperlipemia    Hypertension    Nuclear sclerotic cataract of left eye 12/25/2019   Nuclear sclerotic cataract of right eye 12/25/2019   Peripheral neuropathy    Posterior vitreous detachment of left eye 12/25/2019   Sciatica    Past Surgical History:  Procedure Laterality Date   CRANIOTOMY Bilateral 04/07/2014   Procedure: BILATERAL CRANIOTOMY HEMATOMA EVACUATION SUBDURAL;  Surgeon: Karie Chimera, MD;  Location: [facility];  Service: Neurosurgery;  Laterality: Bilateral;    FAMILY HISTORY History reviewed. No pertinent family history.  SOCIAL HISTORY Social History   Tobacco Use   Smoking status: Never   Smokeless tobacco: Never         OPHTHALMIC EXAM:  Base Eye Exam     Visual Acuity (ETDRS)       Right Left   Dist [place] 20/20 -1 20/25 -1         Tonometry (Tonopen, 8:31 AM)       Right Left   Pressure 11 10         Pupils       Pupils APD   Right PERRL None   Left PERRL None         Visual Fields       Left Right    Full Full         Extraocular  Movement       Right Left    Full Full         Neuro/Psych     Oriented x3: Yes   Mood/Affect: Normal         Dilation     Right eye: 2.5% Phenylephrine, 1.0% Mydriacyl @ 8:31 AM           Slit Lamp and Fundus Exam     External Exam       Right Left   External Normal Normal         Slit Lamp Exam       Right Left   Lids/Lashes Normal Normal   Conjunctiva/Sclera White and quiet White and quiet   Cornea Clear Clear   Anterior Chamber Deep and quiet Deep and quiet   Iris Round and reactive Round and reactive   Lens Centered posterior chamber intraocular lens Centered posterior chamber intraocular lens   Anterior Vitreous Normal Normal         Fundus Exam       Right Left   Posterior Vitreous Posterior vitreous detachment    Disc Normal    C/D Ratio 0.1     Macula Epiretinal membrane, Microaneurysms, CSME OD not clinically detectable as it is improved, Hard drusen    Vessels NPDR- Moderate    Periphery Normal, posterior pole choroidal nevus OD, no interval change, no high risk features, approximately 2.2 disc diameters and basal diameter             IMAGING AND PROCEDURES  Imaging and Procedures for 04/05/21  OCT, Retina - OU - Both Eyes       Right Eye Quality was good. Scan locations included subfoveal. Central Foveal Thickness: 375. Progression has improved. Findings include epiretinal membrane, abnormal foveal contour, retinal drusen .   Left Eye Quality was good. Scan locations included subfoveal. Central Foveal Thickness: 332. Progression has improved. Findings include cystoid macular edema, abnormal foveal contour, retinal drusen .   Notes Center involved CSME OD now much improved after injections intravitreal Eylea.  Currently at 8 weeks interval OD, improved, repeat injection today and examination next in 12-week OS, concomitant improvement in CSME likely from distance effect of the intravitreal Eylea OD.  Much improved again OS after injection  4 weeks prior,      Intravitreal Injection, Pharmacologic Agent - OD - Right Eye       Time Out 04/05/2021. 9:25 AM. Confirmed correct patient, procedure, site, and patient consented.   Anesthesia Topical anesthesia was used. Anesthetic medications included Lidocaine 4%.   Procedure Preparation included Ofloxacin , 10% betadine to eyelids, 5% betadine to ocular surface. A 30 gauge needle was used.   Injection: 2 mg aflibercept 2 MG/0.05ML   Route: Intravitreal, Site: Right Eye   NDC: [id number], Lot: [id number], Waste: 0 mL   Post-op Post injection exam found visual acuity of at least counting fingers. The patient tolerated the procedure well. There were no complications. The patient received written and verbal post procedure care education. Post injection medications  included ocuflox.   Notes Cotton pledget, Q-tip inferotemporal scleral region, near limbus             ASSESSMENT/PLAN:  Moderate nonproliferative diabetic retinopathy of right eye with macular edema (HCC) At 8 weeks now much improved overall, small residual CSME.  Repeat injection today and reevaluate in 3 months with no planned injection  Moderate nonproliferative diabetic retinopathy of left eye with macular edema associated with type  2 diabetes mellitus (HCC) OS, doing very well at 4 weeks post most recent injections return follow-up visit in 3 months (4 months from injection) and no planned injection  Macular pucker, right eye Minor no impact on vision     ICD-10-CM   1. Moderate nonproliferative diabetic retinopathy of right eye with macular edema associated with type 2 diabetes mellitus (HCC)  E11.3311 OCT, Retina - OU - Both Eyes    Intravitreal Injection, Pharmacologic Agent - OD - Right Eye    aflibercept (EYLEA) SOLN 2 mg    2. Moderate nonproliferative diabetic retinopathy of left eye with macular edema associated with type 2 diabetes mellitus ([place])  F81.8299     3. Macular pucker, right eye  H35.371       1.  OU with moderate NPDR and CSME now each improving nicely  2.  Repeat injection Eylea OD today to maintain and follow-up next in 3 months concomitant dilate OU, no planned injection next visit  3.  Ophthalmic Meds Ordered this visit:  Meds ordered this encounter  Medications   aflibercept (EYLEA) SOLN 2 mg       Return in about 3 months (around 07/06/2021) for DILATE OU, OCT, no planned injection.  There are no Patient Instructions on file for this visit.   Explained the diagnoses, plan, and follow up with the patient and they expressed understanding.  Patient expressed understanding of the importance of proper follow up care.   Clent Demark Mehar Sagen M.D. Diseases & Surgery of the Retina and Vitreous Retina [facility] Diabetic [facility] 04/05/21     Abbreviations: M myopia (nearsighted); A astigmatism; H hyperopia (farsighted); P presbyopia; Mrx spectacle prescription;  CTL contact lenses; OD right eye; OS left eye; OU both eyes  XT exotropia; ET esotropia; PEK punctate epithelial keratitis; PEE punctate epithelial erosions; DES dry eye syndrome; MGD meibomian gland dysfunction; ATs artificial tears; PFAT's preservative free artificial tears; [place] nuclear sclerotic cataract; PSC posterior subcapsular cataract; ERM epi-retinal membrane; PVD posterior vitreous detachment; RD retinal detachment; DM diabetes mellitus; DR diabetic retinopathy; NPDR non-proliferative diabetic retinopathy; PDR proliferative diabetic retinopathy; CSME clinically significant macular edema; DME diabetic macular edema; dbh dot blot hemorrhages; CWS cotton wool spot; POAG primary open angle glaucoma; C/D cup-to-disc ratio; HVF humphrey visual field; GVF goldmann visual field; OCT optical coherence tomography; IOP intraocular pressure; BRVO Branch retinal vein occlusion; CRVO central retinal vein occlusion; CRAO central retinal artery occlusion; BRAO branch retinal artery occlusion; RT retinal tear; SB scleral buckle; PPV pars plana vitrectomy; VH Vitreous hemorrhage; PRP panretinal laser photocoagulation; IVK intravitreal kenalog; VMT vitreomacular traction; MH Macular hole;  NVD neovascularization of the disc; NVE neovascularization elsewhere; AREDS age related eye disease study; ARMD age related macular degeneration; POAG primary open angle glaucoma; EBMD epithelial/anterior basement membrane dystrophy; ACIOL anterior chamber intraocular lens; IOL intraocular lens; PCIOL posterior chamber intraocular lens; Phaco/IOL phacoemulsification with intraocular lens placement; [place] photorefractive keratectomy; LASIK laser assisted in situ keratomileusis; HTN hypertension; DM diabetes mellitus; COPD chronic obstructive pulmonary disease

## 2021-04-05 NOTE — Assessment & Plan Note (Signed)
At 8 weeks now much improved overall, small residual CSME.  Repeat injection today and reevaluate in 3 months with no planned injection ?

## 2021-04-05 NOTE — Assessment & Plan Note (Signed)
OS, doing very well at 4 weeks post most recent injections return follow-up visit in 3 months (4 months from injection) and no planned injection ?

## 2021-04-05 NOTE — Assessment & Plan Note (Signed)
Minor no impact on vision ?

## 2021-05-16 DIAGNOSIS — E782 Mixed hyperlipidemia: Secondary | ICD-10-CM | POA: Diagnosis not present

## 2021-05-16 DIAGNOSIS — K219 Gastro-esophageal reflux disease without esophagitis: Secondary | ICD-10-CM | POA: Diagnosis not present

## 2021-05-16 DIAGNOSIS — I10 Essential (primary) hypertension: Secondary | ICD-10-CM | POA: Diagnosis not present

## 2021-05-16 DIAGNOSIS — E11319 Type 2 diabetes mellitus with unspecified diabetic retinopathy without macular edema: Secondary | ICD-10-CM | POA: Diagnosis not present

## 2021-06-10 DIAGNOSIS — W19XXXA Unspecified fall, initial encounter: Secondary | ICD-10-CM | POA: Diagnosis not present

## 2021-06-10 DIAGNOSIS — E11319 Type 2 diabetes mellitus with unspecified diabetic retinopathy without macular edema: Secondary | ICD-10-CM | POA: Diagnosis not present

## 2021-06-10 DIAGNOSIS — E1121 Type 2 diabetes mellitus with diabetic nephropathy: Secondary | ICD-10-CM | POA: Diagnosis not present

## 2021-06-10 DIAGNOSIS — E1140 Type 2 diabetes mellitus with diabetic neuropathy, unspecified: Secondary | ICD-10-CM | POA: Diagnosis not present

## 2021-06-10 DIAGNOSIS — I10 Essential (primary) hypertension: Secondary | ICD-10-CM | POA: Diagnosis not present

## 2021-06-10 DIAGNOSIS — R202 Paresthesia of skin: Secondary | ICD-10-CM | POA: Diagnosis not present

## 2021-06-10 DIAGNOSIS — L989 Disorder of the skin and subcutaneous tissue, unspecified: Secondary | ICD-10-CM | POA: Diagnosis not present

## 2021-06-10 DIAGNOSIS — R269 Unspecified abnormalities of gait and mobility: Secondary | ICD-10-CM | POA: Diagnosis not present

## 2021-06-10 DIAGNOSIS — E559 Vitamin D deficiency, unspecified: Secondary | ICD-10-CM | POA: Diagnosis not present

## 2021-06-10 DIAGNOSIS — E1165 Type 2 diabetes mellitus with hyperglycemia: Secondary | ICD-10-CM | POA: Diagnosis not present

## 2021-07-04 DIAGNOSIS — L570 Actinic keratosis: Secondary | ICD-10-CM | POA: Diagnosis not present

## 2021-07-04 DIAGNOSIS — L821 Other seborrheic keratosis: Secondary | ICD-10-CM | POA: Diagnosis not present

## 2021-07-04 DIAGNOSIS — L304 Erythema intertrigo: Secondary | ICD-10-CM | POA: Diagnosis not present

## 2021-07-04 DIAGNOSIS — Z85828 Personal history of other malignant neoplasm of skin: Secondary | ICD-10-CM | POA: Diagnosis not present

## 2021-07-07 ENCOUNTER — Encounter (INDEPENDENT_AMBULATORY_CARE_PROVIDER_SITE_OTHER): Payer: Medicare Other | Admitting: Ophthalmology

## 2021-07-07 ENCOUNTER — Encounter (INDEPENDENT_AMBULATORY_CARE_PROVIDER_SITE_OTHER): Payer: Self-pay | Admitting: Ophthalmology

## 2021-07-07 ENCOUNTER — Ambulatory Visit (INDEPENDENT_AMBULATORY_CARE_PROVIDER_SITE_OTHER): Payer: Medicare Other | Admitting: Ophthalmology

## 2021-07-07 DIAGNOSIS — E113312 Type 2 diabetes mellitus with moderate nonproliferative diabetic retinopathy with macular edema, left eye: Secondary | ICD-10-CM

## 2021-07-07 DIAGNOSIS — H35371 Puckering of macula, right eye: Secondary | ICD-10-CM | POA: Diagnosis not present

## 2021-07-07 DIAGNOSIS — D3131 Benign neoplasm of right choroid: Secondary | ICD-10-CM

## 2021-07-07 DIAGNOSIS — I10 Essential (primary) hypertension: Secondary | ICD-10-CM | POA: Diagnosis not present

## 2021-07-07 DIAGNOSIS — E113311 Type 2 diabetes mellitus with moderate nonproliferative diabetic retinopathy with macular edema, right eye: Secondary | ICD-10-CM | POA: Diagnosis not present

## 2021-07-07 DIAGNOSIS — D649 Anemia, unspecified: Secondary | ICD-10-CM | POA: Insufficient documentation

## 2021-07-07 NOTE — Progress Notes (Signed)
07/07/2021     CHIEF COMPLAINT Patient presents for  Chief Complaint  Patient presents with   Diabetic Retinopathy without Macular Edema      HISTORY OF PRESENT ILLNESS: David Mendoza is a 81 y.o. male who presents to the clinic today for:     Referring physician: Josetta Huddle, MD [address] [place],  [place] [address]  HISTORICAL INFORMATION:   Selected notes from the [facility]: No current outpatient medications on file. (Ophthalmic Drugs)   No current facility-administered medications for this visit. (Ophthalmic Drugs)   Current Outpatient Medications (Other)  Medication Sig   amLODipine (NORVASC) 5 MG tablet Take 5 mg by mouth daily.   carvedilol (COREG) 6.25 MG tablet Take 6.25 mg by mouth 2 (two) times daily with a meal.   COVID-19 mRNA bivalent vaccine, Pfizer, (PFIZER COVID-19 VAC BIVALENT) injection Inject into the muscle.   glipiZIDE (GLUCOTROL) 5 MG tablet Take 5 mg by mouth 2 (two) times daily.   hydrochlorothiazide (HYDRODIURIL) 12.5 MG tablet Take 12.5 mg by mouth daily.   losartan (COZAAR) 100 MG tablet Take 100 mg by mouth daily.   metFORMIN (GLUCOPHAGE) 500 MG tablet Take 1,000 mg by mouth 2 (two) times daily with a meal.   omeprazole (PRILOSEC) 40 MG capsule Take 40 mg by mouth daily.   simvastatin (ZOCOR) 40 MG tablet Take 40 mg by mouth daily.   rosuvastatin (CRESTOR) 20 MG tablet Take 20 mg by mouth daily. (Patient not taking: Reported on 07/07/2021)   No current facility-administered medications for this visit. (Other)      REVIEW OF SYSTEMS: ROS   Negative for: Constitutional, Gastrointestinal, Neurological, Skin, Genitourinary, Musculoskeletal, HENT, Endocrine, Cardiovascular, Eyes, Respiratory, Psychiatric, Allergic/Imm, Heme/Lymph Last edited by Hurman Horn, MD on 07/07/2021  9:31 AM.       ALLERGIES Allergies  Allergen Reactions   Sulfa Antibiotics Other (See Comments)     Doesn't remember     PAST MEDICAL HISTORY Past Medical History:  Diagnosis Date   DVT (deep venous thrombosis) ([place])    Hyperlipemia    Hypertension    Nuclear sclerotic cataract of left eye 12/25/2019   Nuclear sclerotic cataract of right eye 12/25/2019   Peripheral neuropathy    Posterior vitreous detachment of left eye 12/25/2019   Sciatica    Past Surgical History:  Procedure Laterality Date   CRANIOTOMY Bilateral 04/07/2014   Procedure: BILATERAL CRANIOTOMY HEMATOMA EVACUATION SUBDURAL;  Surgeon: Karie Chimera, MD;  Location: [facility];  Service: Neurosurgery;  Laterality: Bilateral;    FAMILY HISTORY No family history on file.  SOCIAL HISTORY Social History   Tobacco Use   Smoking status: Never   Smokeless tobacco: Never         OPHTHALMIC EXAM:  Base Eye Exam     Visual Acuity (Snellen - Linear)       Right Left   Dist [place] 20/25 20/25   Dist ph [place] NI NI         Tonometry (Tonopen, 9:11 AM)       Right Left   Pressure 8 6         Extraocular Movement       Right Left    Full, Ortho Full, Ortho         Neuro/Psych     Oriented x3: Yes   Mood/Affect: Normal         Dilation  Both eyes: 1.0% Mydriacyl, 2.5% Phenylephrine @ 9:11 AM           Slit Lamp and Fundus Exam     External Exam       Right Left   External pallor pallor         Slit Lamp Exam       Right Left   Lids/Lashes pale conj pale conj   Conjunctiva/Sclera White and quiet White and quiet   Cornea Clear Clear   Anterior Chamber Deep and quiet Deep and quiet   Iris Round and reactive Round and reactive   Lens Centered posterior chamber intraocular lens Centered posterior chamber intraocular lens   Anterior Vitreous Normal Normal         Fundus Exam       Right Left   Posterior Vitreous Posterior vitreous detachment Posterior vitreous detachment   Disc Normal Normal   C/D Ratio 0.1 0.1   Macula Epiretinal membrane, Microaneurysms, CSME OD  not clinically detectable as it is improved, Hard drusen Microaneurysms, epiretinal membrane, CSME OS not clinically detectable, Hard drusen   Vessels NPDR- Moderate NPDR- Moderate   Periphery Normal, posterior pole choroidal nevus OD, no interval change, no high risk features, approximately 2.2 disc diameters and basal diameter Good retinopexy at 2, 2:30            IMAGING AND PROCEDURES  Imaging and Procedures for 07/07/21  OCT, Retina - OU - Both Eyes       Right Eye Quality was good. Scan locations included subfoveal. Central Foveal Thickness: 426. Progression has worsened. Findings include abnormal foveal contour, retinal drusen , cystoid macular edema, epiretinal membrane.   Left Eye Quality was good. Scan locations included subfoveal. Central Foveal Thickness: 343. Progression has been stable. Findings include abnormal foveal contour, retinal drusen , epiretinal membrane.   Notes Center involved CSME OD now much improved after injections intravitreal Eylea.  Currently at 3 months interval OD, improved, no injection today and examination next in 7-week OS, concomitant improvement in CSME stabilize no recurrence              ASSESSMENT/PLAN:  Anemia Encourage patient today to seek evaluation with Dr. Inda Merlin for urgent significant anemia and because of the risk of heart attack stroke or other hypoxic event from low blood count  Recent evaluation" 4 weeks ago.  Choroidal nevus of right eye Stable no change  Macular pucker, right eye Slight worsening OD, with center involved CME but also outer retinal photoreceptor changes with good acuity.  Follow closely if worsens may need membrane peel with vitrectomy OD  Moderate nonproliferative diabetic retinopathy of left eye with macular edema associated with type 2 diabetes mellitus (HCC) Improved overall stable  Moderate nonproliferative diabetic retinopathy of right eye with macular edema (HCC) Improved overall, no  change in moderate NPDR OU     ICD-10-CM   1. Moderate nonproliferative diabetic retinopathy of left eye with macular edema associated with type 2 diabetes mellitus (HCC)  W29.9371 OCT, Retina - OU - Both Eyes    2. Moderate nonproliferative diabetic retinopathy of right eye with macular edema associated with type 2 diabetes mellitus (HCC)  E11.3311 OCT, Retina - OU - Both Eyes    3. Choroidal nevus of right eye  D31.31     4. Macular pucker, right eye  H35.371       1.  Moderate NPDR OU stable, no recurrence of CSME OS.  2.  Under CME OD likely related  to the epiretinal membrane as photoreceptor changes noted in the outer retina as well.  We will follow this closely if visual acuity impacted with this nodular change in the photoreceptor layer worsens, may need surgical intervention  3.  Clinically, apparent severe anemia although patient not symptomatic.  Patient encouraged to contact Dr. Inda Merlin today and if no dilation possible, seek urgent care evaluation affiliated with Dr. Geraldo Docker practice if possible.  Ophthalmic Meds Ordered this visit:  No orders of the defined types were placed in this encounter.      Return in about 7 weeks (around 08/25/2021) for DILATE OU, OCT.  There are no Patient Instructions on file for this visit.   Explained the diagnoses, plan, and follow up with the patient and they expressed understanding.  Patient expressed understanding of the importance of proper follow up care.   Clent Demark Giana Castner M.D. Diseases & Surgery of the Retina and Vitreous Retina & Diabetic [place] 07/07/21     Abbreviations: M myopia (nearsighted); A astigmatism; H hyperopia (farsighted); P presbyopia; Mrx spectacle prescription;  CTL contact lenses; OD right eye; OS left eye; OU both eyes  XT exotropia; ET esotropia; PEK punctate epithelial keratitis; PEE punctate epithelial erosions; DES dry eye syndrome; MGD meibomian gland dysfunction; ATs artificial tears; PFAT's  preservative free artificial tears; [place] nuclear sclerotic cataract; PSC posterior subcapsular cataract; ERM epi-retinal membrane; PVD posterior vitreous detachment; RD retinal detachment; DM diabetes mellitus; DR diabetic retinopathy; NPDR non-proliferative diabetic retinopathy; PDR proliferative diabetic retinopathy; CSME clinically significant macular edema; DME diabetic macular edema; dbh dot blot hemorrhages; CWS cotton wool spot; POAG primary open angle glaucoma; C/D cup-to-disc ratio; HVF humphrey visual field; GVF goldmann visual field; OCT optical coherence tomography; IOP intraocular pressure; BRVO Branch retinal vein occlusion; CRVO central retinal vein occlusion; CRAO central retinal artery occlusion; BRAO branch retinal artery occlusion; RT retinal tear; SB scleral buckle; PPV pars plana vitrectomy; VH Vitreous hemorrhage; PRP panretinal laser photocoagulation; IVK intravitreal kenalog; VMT vitreomacular traction; MH Macular hole;  NVD neovascularization of the disc; NVE neovascularization elsewhere; AREDS age related eye disease study; ARMD age related macular degeneration; POAG primary open angle glaucoma; EBMD epithelial/anterior basement membrane dystrophy; ACIOL anterior chamber intraocular lens; IOL intraocular lens; PCIOL posterior chamber intraocular lens; Phaco/IOL phacoemulsification with intraocular lens placement; [place] photorefractive keratectomy; LASIK laser assisted in situ keratomileusis; HTN hypertension; DM diabetes mellitus; COPD chronic obstructive pulmonary disease

## 2021-07-07 NOTE — Assessment & Plan Note (Signed)
Stable no change

## 2021-07-07 NOTE — Assessment & Plan Note (Signed)
Improved overall, no change in moderate NPDR OU

## 2021-07-07 NOTE — Assessment & Plan Note (Signed)
Improved overall stable

## 2021-07-07 NOTE — Assessment & Plan Note (Signed)
Encourage patient today to seek evaluation with Dr. Inda Merlin for urgent significant anemia and because of the risk of heart attack stroke or other hypoxic event from low blood count  Recent evaluation" 4 weeks ago.

## 2021-07-07 NOTE — Assessment & Plan Note (Signed)
Slight worsening OD, with center involved CME but also outer retinal photoreceptor changes with good acuity.  Follow closely if worsens may need membrane peel with vitrectomy OD

## 2021-07-21 DIAGNOSIS — D649 Anemia, unspecified: Secondary | ICD-10-CM | POA: Diagnosis not present

## 2021-07-29 DIAGNOSIS — D649 Anemia, unspecified: Secondary | ICD-10-CM | POA: Diagnosis not present

## 2021-08-01 ENCOUNTER — Encounter: Payer: Self-pay | Admitting: *Deleted

## 2021-08-02 ENCOUNTER — Other Ambulatory Visit: Payer: Self-pay | Admitting: *Deleted

## 2021-08-02 ENCOUNTER — Encounter: Payer: Self-pay | Admitting: *Deleted

## 2021-08-03 ENCOUNTER — Ambulatory Visit: Payer: Medicare Other | Admitting: Diagnostic Neuroimaging

## 2021-08-03 ENCOUNTER — Encounter: Payer: Self-pay | Admitting: Diagnostic Neuroimaging

## 2021-08-03 VITALS — BP 148/75 | HR 60 | Ht 70.0 in | Wt 185.0 lb

## 2021-08-03 DIAGNOSIS — R269 Unspecified abnormalities of gait and mobility: Secondary | ICD-10-CM | POA: Diagnosis not present

## 2021-08-03 NOTE — Progress Notes (Signed)
[facility]  PATIENT: David Mendoza DOB: 1940-07-21  REFERRING CLINICIAN: Josetta Huddle, MD HISTORY FROM: patient  REASON FOR VISIT: new consult    HISTORICAL  CHIEF COMPLAINT:  Chief Complaint  Patient presents with   Gait Problem    Rm 7 New Pt , wife- Olegario Shearer "my balance is not good at all, walking slower and paying attention"     HISTORY OF PRESENT ILLNESS:   81 year old male here for evaluation of gait and balance difficulty.  2016 patient slipped on some black ice and fell backwards hit his shoulders and head.  2 months later and he was developing worsening gait and balance difficulty.  He had CT scan of the head which showed bilateral subdural hematomas.  These were treated with bur hole craniotomies and evacuation.  Patient made a good recovery.  However gradually since that time he has had worsening gait and balance difficulty.  Has some chronic low back pain issues.  Having some issues with stepping and turning.  Has history of diabetes with some diabetic neuropathy and numbness in the feet.   REVIEW OF SYSTEMS: Full 14 system review of systems performed and negative with exception of: as per HPI.  ALLERGIES: Allergies  Allergen Reactions   Sulfa Antibiotics Diarrhea    HOME MEDICATIONS: Outpatient Medications Prior to Visit  Medication Sig Dispense Refill   amLODipine (NORVASC) 5 MG tablet Take 5 mg by mouth daily.     carvedilol (COREG) 25 MG tablet Take 25 mg by mouth 2 (two) times daily.     COVID-19 mRNA bivalent vaccine, Pfizer, (PFIZER COVID-19 VAC BIVALENT) injection Inject into the muscle. 0.3 mL 0   Cyanocobalamin (B-12) 1000 MCG TBCR Take 1 tablet by mouth daily.     folic acid (FOLVITE) 295 MCG tablet Take 800 mcg by mouth daily.     glipiZIDE (GLUCOTROL) 5 MG tablet Take 5 mg by mouth 2 (two) times daily.     hydrochlorothiazide (HYDRODIURIL) 12.5 MG tablet Take 12.5 mg by mouth daily.     losartan (COZAAR) 100 MG tablet Take  100 mg by mouth daily.     losartan-hydrochlorothiazide (HYZAAR) 100-12.5 MG tablet Take by mouth daily.     metFORMIN (GLUCOPHAGE) 500 MG tablet Take 1,000 mg by mouth 2 (two) times daily with a meal.     omeprazole (PRILOSEC) 40 MG capsule Take 40 mg by mouth daily.     rosuvastatin (CRESTOR) 20 MG tablet Take 20 mg by mouth daily.     sertraline (ZOLOFT) 50 MG tablet Take 75 mg by mouth every morning.     simvastatin (ZOCOR) 40 MG tablet Take 40 mg by mouth daily.     No facility-administered medications prior to visit.    PAST MEDICAL HISTORY: Past Medical History:  Diagnosis Date   Chronic anticoagulation    Diabetic retinopathy ([place])    DVT (deep venous thrombosis) (HCC)    x2   Hyperlipemia    Hypertension    Multiple falls    Nuclear sclerotic cataract of left eye 12/25/2019   Nuclear sclerotic cataract of right eye 12/25/2019   Peripheral neuropathy    Posterior vitreous detachment of left eye 12/25/2019   Sciatica     PAST SURGICAL HISTORY: Past Surgical History:  Procedure Laterality Date   CRANIOTOMY Bilateral 04/07/2014   Procedure: BILATERAL CRANIOTOMY HEMATOMA EVACUATION SUBDURAL;  Surgeon: Karie Chimera, MD;  Location: [facility];  Service: Neurosurgery;  Laterality: Bilateral;   PILONIDAL CYST EXCISION  FAMILY HISTORY: No family history on file.  SOCIAL HISTORY: Social History   Socioeconomic History   Marital status: Married    Spouse name: Vicky   Number of children: Not on file   Years of education: Not on file   Highest education level: Not on file  Occupational History   Not on file  Tobacco Use   Smoking status: Former    Types: Cigarettes   Smokeless tobacco: Never   Tobacco comments:    Quit smoking 50 yrs ago  Substance and Sexual Activity   Alcohol use: Yes    Comment: occas   Drug use: Never   Sexual activity: Not on file  Other Topics Concern   Not on file  Social History Narrative   Lives with wife   Social  Determinants of Health   Financial Resource Strain: Not on file  Food Insecurity: Not on file  Transportation Needs: Not on file  Physical Activity: Not on file  Stress: Not on file  Social Connections: Not on file  Intimate Partner Violence: Not on file     PHYSICAL EXAM  GENERAL EXAM/CONSTITUTIONAL: Vitals:  Vitals:   08/03/21 0944  BP: (!) 148/75  Pulse: 60  Weight: 185 lb (83.9 kg)  Height: '5\' 10"'$  (1.778 m)   Body mass index is 26.54 kg/m. Wt Readings from Last 3 Encounters:  08/03/21 185 lb (83.9 kg)  04/07/14 216 lb 6.4 oz (98.2 kg)   Patient is in no distress; well developed, nourished and groomed; neck is supple  CARDIOVASCULAR: Examination of carotid arteries is normal; no carotid bruits Regular rate and rhythm, no murmurs Examination of peripheral vascular system by observation and palpation is normal  EYES: Ophthalmoscopic exam of optic discs and posterior segments is normal; no papilledema or hemorrhages No results found.  MUSCULOSKELETAL: Gait, strength, tone, movements noted in Neurologic exam below  NEUROLOGIC: MENTAL STATUS:      No data to display         awake, alert, oriented to person, place and time recent and remote memory intact normal attention and concentration language fluent, comprehension intact, naming intact fund of knowledge appropriate  CRANIAL NERVE:  2nd - no papilledema on fundoscopic exam 2nd, 3rd, 4th, 6th - pupils equal and reactive to light, visual fields full to confrontation, extraocular muscles intact, no nystagmus 5th - facial sensation symmetric 7th - facial strength symmetric 8th - hearing intact 9th - palate elevates symmetrically, uvula midline 11th - shoulder shrug symmetric 12th - tongue protrusion midline  MOTOR:  normal bulk and tone, full strength in the BUE, BLE; EXCEPT LEFT FOOT DF 4 POSTURAL AND ACTION TREMOR IN BUE MILD BRADYKINESIA IN LUE > RUE NO RIGIDITY POSTURAL AND ACTION TREMOR IN  BUE  SENSORY:  normal and symmetric to light touch, temperature, vibration  COORDINATION:  finger-nose-finger, fine finger movements normal  REFLEXES:  deep tendon reflexes TRACE and symmetric; ABSENT AT ANKLES  GAIT/STATION:  WIDE GAIT; SHORT STEPS; CANNOT STAND ON HEELS; CANNOT WALK ON TOES; ROMBERG NEG; CANNOT TANDEM; UNSTEADY TURNING; STOOPED POSTURE     DIAGNOSTIC DATA (LABS, IMAGING, TESTING) - I reviewed patient records, labs, notes, testing and imaging myself where available.  Lab Results  Component Value Date   WBC 10.7 (H) 04/07/2014   HGB 12.2 (L) 04/07/2014   HCT 35.8 (L) 04/07/2014   MCV 93.2 04/07/2014   PLT 290 04/07/2014      Component Value Date/Time   NA 141 04/07/2014 1524   K 3.8  04/07/2014 1524   CL 105 04/07/2014 1524   CO2 30 04/07/2014 1524   GLUCOSE 124 (H) 04/07/2014 1524   BUN 28 (H) 04/07/2014 1524   CREATININE 1.19 04/07/2014 1524   CALCIUM 9.5 04/07/2014 1524   GFRNONAA 59 (L) 04/07/2014 1524   GFRAA 68 (L) 04/07/2014 1524   No results found for: "CHOL", "HDL", "LDLCALC", "LDLDIRECT", "TRIG", "CHOLHDL" No results found for: "HGBA1C" No results found for: "VITAMINB12" No results found for: "TSH"   07/21/05 MR lumbar spine 1.  Right foraminal/extraforaminal disc protrusion at L1-2 likely affecting the right L1 nerve root.  2.  Diffuse disc protrusion at L5-S1.  There is a central component which comes close to contacting the anterior thecal sac and there is a large foraminal to extraforaminal component which appears to contact the right L5 nerve root.    3.  Multilevel facet disease without definite pars defects.     ASSESSMENT AND PLAN  81 y.o. year old male here with:  Dx:  1. Gait difficulty      PLAN:  GAIT DIFFICULTY (since ~2016; likely due to diabetic neuropathy + lumbar radiculopathies + age / deconditioning) - refer to PT evaluation - fall precautions; use cane / walker - consider MRI lumbar spine in future -  consider MRI brain in future  Orders Placed This Encounter  Procedures   Ambulatory referral to Physical Therapy   Return for pending if symptoms worsen or fail to improve.    Penni Bombard, MD 2/48/2500, 37:04 AM Certified in Neurology, Neurophysiology and Neuroimaging  [facility] [address] [place], [place] [address] [phone]

## 2021-08-04 ENCOUNTER — Telehealth: Payer: Self-pay | Admitting: Diagnostic Neuroimaging

## 2021-08-04 NOTE — Telephone Encounter (Signed)
Referral for Physical Therapy sent to [facility] [phone].

## 2021-08-05 DIAGNOSIS — I10 Essential (primary) hypertension: Secondary | ICD-10-CM | POA: Diagnosis not present

## 2021-08-05 DIAGNOSIS — E782 Mixed hyperlipidemia: Secondary | ICD-10-CM | POA: Diagnosis not present

## 2021-08-05 DIAGNOSIS — E11319 Type 2 diabetes mellitus with unspecified diabetic retinopathy without macular edema: Secondary | ICD-10-CM | POA: Diagnosis not present

## 2021-08-05 DIAGNOSIS — K219 Gastro-esophageal reflux disease without esophagitis: Secondary | ICD-10-CM | POA: Diagnosis not present

## 2021-08-08 DIAGNOSIS — G8929 Other chronic pain: Secondary | ICD-10-CM | POA: Diagnosis not present

## 2021-08-08 DIAGNOSIS — R2689 Other abnormalities of gait and mobility: Secondary | ICD-10-CM | POA: Diagnosis not present

## 2021-08-08 DIAGNOSIS — R269 Unspecified abnormalities of gait and mobility: Secondary | ICD-10-CM | POA: Diagnosis not present

## 2021-08-08 DIAGNOSIS — Z9181 History of falling: Secondary | ICD-10-CM | POA: Diagnosis not present

## 2021-08-08 DIAGNOSIS — R29898 Other symptoms and signs involving the musculoskeletal system: Secondary | ICD-10-CM | POA: Diagnosis not present

## 2021-08-08 DIAGNOSIS — M545 Low back pain, unspecified: Secondary | ICD-10-CM | POA: Diagnosis not present

## 2021-08-08 DIAGNOSIS — R262 Difficulty in walking, not elsewhere classified: Secondary | ICD-10-CM | POA: Diagnosis not present

## 2021-08-11 DIAGNOSIS — G8929 Other chronic pain: Secondary | ICD-10-CM | POA: Diagnosis not present

## 2021-08-11 DIAGNOSIS — M545 Low back pain, unspecified: Secondary | ICD-10-CM | POA: Diagnosis not present

## 2021-08-11 DIAGNOSIS — R29898 Other symptoms and signs involving the musculoskeletal system: Secondary | ICD-10-CM | POA: Diagnosis not present

## 2021-08-11 DIAGNOSIS — Z9181 History of falling: Secondary | ICD-10-CM | POA: Diagnosis not present

## 2021-08-11 DIAGNOSIS — R269 Unspecified abnormalities of gait and mobility: Secondary | ICD-10-CM | POA: Diagnosis not present

## 2021-08-11 DIAGNOSIS — R2689 Other abnormalities of gait and mobility: Secondary | ICD-10-CM | POA: Diagnosis not present

## 2021-08-11 DIAGNOSIS — R262 Difficulty in walking, not elsewhere classified: Secondary | ICD-10-CM | POA: Diagnosis not present

## 2021-08-16 DIAGNOSIS — R262 Difficulty in walking, not elsewhere classified: Secondary | ICD-10-CM | POA: Diagnosis not present

## 2021-08-16 DIAGNOSIS — R269 Unspecified abnormalities of gait and mobility: Secondary | ICD-10-CM | POA: Diagnosis not present

## 2021-08-16 DIAGNOSIS — R2689 Other abnormalities of gait and mobility: Secondary | ICD-10-CM | POA: Diagnosis not present

## 2021-08-16 DIAGNOSIS — R29898 Other symptoms and signs involving the musculoskeletal system: Secondary | ICD-10-CM | POA: Diagnosis not present

## 2021-08-16 DIAGNOSIS — Z9181 History of falling: Secondary | ICD-10-CM | POA: Diagnosis not present

## 2021-08-25 ENCOUNTER — Ambulatory Visit (INDEPENDENT_AMBULATORY_CARE_PROVIDER_SITE_OTHER): Payer: Medicare Other | Admitting: Ophthalmology

## 2021-08-25 ENCOUNTER — Encounter (INDEPENDENT_AMBULATORY_CARE_PROVIDER_SITE_OTHER): Payer: Self-pay | Admitting: Ophthalmology

## 2021-08-25 DIAGNOSIS — E113312 Type 2 diabetes mellitus with moderate nonproliferative diabetic retinopathy with macular edema, left eye: Secondary | ICD-10-CM | POA: Diagnosis not present

## 2021-08-25 DIAGNOSIS — H35371 Puckering of macula, right eye: Secondary | ICD-10-CM

## 2021-08-25 DIAGNOSIS — E113311 Type 2 diabetes mellitus with moderate nonproliferative diabetic retinopathy with macular edema, right eye: Secondary | ICD-10-CM | POA: Diagnosis not present

## 2021-08-25 MED ORDER — AFLIBERCEPT 2MG/0.05ML IZ SOLN FOR KALEIDOSCOPE
2.0000 mg | INTRAVITREAL | Status: AC | PRN
  Administered 2021-08-25: 2 mg via INTRAVITREAL

## 2021-08-25 NOTE — Assessment & Plan Note (Signed)
Moderate recurrence of CSME OD.

## 2021-08-25 NOTE — Assessment & Plan Note (Signed)
Moderate ERM,

## 2021-08-25 NOTE — Assessment & Plan Note (Signed)
No recurrence of CSME OS

## 2021-08-25 NOTE — Progress Notes (Signed)
08/25/2021     CHIEF COMPLAINT Patient presents for  Chief Complaint  Patient presents with   Diabetic Retinopathy with Macular Edema      HISTORY OF PRESENT ILLNESS: David Mendoza is a 81 y.o. male who presents to the clinic today for:   HPI   7 weeks for DILATE OU, OCT. Pt stated vision has slightly worsened. Pt reports vision is not as good as it was before. Pt stated, "The right eye, some days are better than others. It is slightly blurry."  Last edited by Silvestre Moment on 08/25/2021  9:14 AM.      Referring physician: Josetta Huddle, MD [address],  [place] [address]  HISTORICAL INFORMATION:   Selected notes from the [facility]: No current outpatient medications on file. (Ophthalmic Drugs)   No current facility-administered medications for this visit. (Ophthalmic Drugs)   Current Outpatient Medications (Other)  Medication Sig   amLODipine (NORVASC) 5 MG tablet Take 5 mg by mouth daily.   carvedilol (COREG) 25 MG tablet Take 25 mg by mouth 2 (two) times daily.   COVID-19 mRNA bivalent vaccine, Pfizer, (PFIZER COVID-19 VAC BIVALENT) injection Inject into the muscle.   Cyanocobalamin (B-12) 1000 MCG TBCR Take 1 tablet by mouth daily.   folic acid (FOLVITE) 761 MCG tablet Take 800 mcg by mouth daily.   glipiZIDE (GLUCOTROL) 5 MG tablet Take 5 mg by mouth 2 (two) times daily.   hydrochlorothiazide (HYDRODIURIL) 12.5 MG tablet Take 12.5 mg by mouth daily.   losartan (COZAAR) 100 MG tablet Take 100 mg by mouth daily.   losartan-hydrochlorothiazide (HYZAAR) 100-12.5 MG tablet Take by mouth daily.   metFORMIN (GLUCOPHAGE) 500 MG tablet Take 1,000 mg by mouth 2 (two) times daily with a meal.   omeprazole (PRILOSEC) 40 MG capsule Take 40 mg by mouth daily.   rosuvastatin (CRESTOR) 20 MG tablet Take 20 mg by mouth daily.   sertraline (ZOLOFT) 50 MG tablet Take 75 mg by mouth every morning.   No current  facility-administered medications for this visit. (Other)      REVIEW OF SYSTEMS: ROS   Negative for: Constitutional, Gastrointestinal, Neurological, Skin, Genitourinary, Musculoskeletal, HENT, Endocrine, Cardiovascular, Eyes, Respiratory, Psychiatric, Allergic/Imm, Heme/Lymph Last edited by Silvestre Moment on 08/25/2021  9:14 AM.       ALLERGIES Allergies  Allergen Reactions   Sulfa Antibiotics Diarrhea    PAST MEDICAL HISTORY Past Medical History:  Diagnosis Date   Chronic anticoagulation    Diabetic retinopathy ([place])    DVT (deep venous thrombosis) ([place])    x2   Hyperlipemia    Hypertension    Multiple falls    Nuclear sclerotic cataract of left eye 12/25/2019   Nuclear sclerotic cataract of right eye 12/25/2019   Peripheral neuropathy    Posterior vitreous detachment of left eye 12/25/2019   Sciatica    Past Surgical History:  Procedure Laterality Date   CRANIOTOMY Bilateral 04/07/2014   Procedure: BILATERAL CRANIOTOMY HEMATOMA EVACUATION SUBDURAL;  Surgeon: Karie Chimera, MD;  Location: [facility];  Service: Neurosurgery;  Laterality: Bilateral;   PILONIDAL CYST EXCISION      FAMILY HISTORY History reviewed. No pertinent family history.  SOCIAL HISTORY Social History   Tobacco Use   Smoking status: Former    Types: Cigarettes   Smokeless tobacco: Never   Tobacco comments:    Quit smoking 50 yrs ago  Substance Use Topics  Alcohol use: Yes    Comment: occas   Drug use: Never         OPHTHALMIC EXAM:  Base Eye Exam     Visual Acuity (ETDRS)       Right Left   Dist [place] 20/50 -2 20/25 -2   Dist ph [place] NI          Tonometry (Tonopen, 9:18 AM)       Right Left   Pressure 13 14         Pupils       Pupils APD   Right PERRL None   Left PERRL None         Visual Fields       Left Right    Full Full         Extraocular Movement       Right Left    Full, Ortho Full, Ortho         Neuro/Psych     Oriented x3: Yes    Mood/Affect: Normal         Dilation     Both eyes: 1.0% Mydriacyl, 2.5% Phenylephrine @ 9:18 AM           Slit Lamp and Fundus Exam     External Exam       Right Left   External pallor pallor         Slit Lamp Exam       Right Left   Lids/Lashes pale conj pale conj   Conjunctiva/Sclera White and quiet White and quiet   Cornea Clear Clear   Anterior Chamber Deep and quiet Deep and quiet   Iris Round and reactive Round and reactive   Lens Centered posterior chamber intraocular lens Centered posterior chamber intraocular lens   Anterior Vitreous Normal Normal         Fundus Exam       Right Left   Posterior Vitreous Posterior vitreous detachment Posterior vitreous detachment   Disc Normal Normal   C/D Ratio 0.1 0.1   Macula Epiretinal membrane, Microaneurysms, Hard drusen, Mild clinically significant macular edema Microaneurysms, epiretinal membrane, CSME OS not clinically detectable, Hard drusen   Vessels NPDR- Moderate NPDR- Moderate   Periphery Normal, posterior pole choroidal nevus OD, no interval change, no high risk features, approximately 2.2 disc diameters and basal diameter Good retinopexy at 2, 2:30            IMAGING AND PROCEDURES  Imaging and Procedures for 08/25/21  OCT, Retina - OU - Both Eyes       Right Eye Quality was good. Scan locations included subfoveal. Central Foveal Thickness: 469. Progression has worsened. Findings include abnormal foveal contour, retinal drusen , cystoid macular edema, epiretinal membrane.   Left Eye Quality was good. Scan locations included subfoveal. Central Foveal Thickness: 348. Progression has been stable. Findings include abnormal foveal contour, retinal drusen , epiretinal membrane.   Notes Center involved CSME OD now much improved after injections intravitreal Eylea.  Currently at 4 months interval OD, now with recollection and worsening of center involved CSME OD.  Proving that antivegF is required  to control this condition in the right eye.       Intravitreal Injection, Pharmacologic Agent - OD - Right Eye       Time Out 08/25/2021. 9:54 AM. Confirmed correct patient, procedure, site, and patient consented.   Anesthesia Topical anesthesia was used. Anesthetic medications included Lidocaine 4%.   Procedure Preparation included 5% betadine  to ocular surface, 10% betadine to eyelids, Ofloxacin . A 30 gauge needle was used.   Injection: 2 mg aflibercept 2 MG/0.05ML   Route: Intravitreal, Site: Right Eye   NDC: [id number], Lot: [id number], Expiration date: 09/11/2022, Waste: 0 mL   Post-op Post injection exam found visual acuity of at least counting fingers. The patient tolerated the procedure well. There were no complications. The patient received written and verbal post procedure care education. Post injection medications included ocuflox.   Notes Cotton pledget, Q-tip inferotemporal scleral region, near limbus             ASSESSMENT/PLAN:  Moderate nonproliferative diabetic retinopathy of left eye with macular edema associated with type 2 diabetes mellitus (HCC) No recurrence of CSME OS  Moderate nonproliferative diabetic retinopathy of right eye with macular edema (HCC) Moderate recurrence of CSME OD.  Macular pucker, right eye Moderate ERM,      ICD-10-CM   1. Moderate nonproliferative diabetic retinopathy of right eye with macular edema associated with type 2 diabetes mellitus (HCC)  G64.4034 Intravitreal Injection, Pharmacologic Agent - OD - Right Eye    aflibercept (EYLEA) SOLN 2 mg    2. Moderate nonproliferative diabetic retinopathy of left eye with macular edema associated with type 2 diabetes mellitus (HCC)  V42.5956 OCT, Retina - OU - Both Eyes    3. Macular pucker, right eye  H35.371       1.  OS no sign of recurrence of CSME  2.  OD recurrence of CSME now at 4 months post most recent injection Eylea.  We will need to reinstitute and restart  Eylea today but follow-up next in 3 months  3.  EpiRetinal membrane right eye still moderate will observe  Ophthalmic Meds Ordered this visit:  Meds ordered this encounter  Medications   aflibercept (EYLEA) SOLN 2 mg       Return in about 9 weeks (around 10/27/2021) for dilate, OD, EYLEA OCT.  There are no Patient Instructions on file for this visit.   Explained the diagnoses, plan, and follow up with the patient and they expressed understanding.  Patient expressed understanding of the importance of proper follow up care.   Clent Demark Devansh Riese M.D. Diseases & Surgery of the Retina and Vitreous Retina & Diabetic [place] 08/25/21     Abbreviations: M myopia (nearsighted); A astigmatism; H hyperopia (farsighted); P presbyopia; Mrx spectacle prescription;  CTL contact lenses; OD right eye; OS left eye; OU both eyes  XT exotropia; ET esotropia; PEK punctate epithelial keratitis; PEE punctate epithelial erosions; DES dry eye syndrome; MGD meibomian gland dysfunction; ATs artificial tears; PFAT's preservative free artificial tears; [place] nuclear sclerotic cataract; PSC posterior subcapsular cataract; ERM epi-retinal membrane; PVD posterior vitreous detachment; RD retinal detachment; DM diabetes mellitus; DR diabetic retinopathy; NPDR non-proliferative diabetic retinopathy; PDR proliferative diabetic retinopathy; CSME clinically significant macular edema; DME diabetic macular edema; dbh dot blot hemorrhages; CWS cotton wool spot; POAG primary open angle glaucoma; C/D cup-to-disc ratio; HVF humphrey visual field; GVF goldmann visual field; OCT optical coherence tomography; IOP intraocular pressure; BRVO Branch retinal vein occlusion; CRVO central retinal vein occlusion; CRAO central retinal artery occlusion; BRAO branch retinal artery occlusion; RT retinal tear; SB scleral buckle; PPV pars plana vitrectomy; VH Vitreous hemorrhage; PRP panretinal laser photocoagulation; IVK intravitreal kenalog; VMT  vitreomacular traction; MH Macular hole;  NVD neovascularization of the disc; NVE neovascularization elsewhere; AREDS age related eye disease study; ARMD age related macular degeneration; POAG primary open angle glaucoma; EBMD epithelial/anterior basement  membrane dystrophy; ACIOL anterior chamber intraocular lens; IOL intraocular lens; PCIOL posterior chamber intraocular lens; Phaco/IOL phacoemulsification with intraocular lens placement; [place] photorefractive keratectomy; LASIK laser assisted in situ keratomileusis; HTN hypertension; DM diabetes mellitus; COPD chronic obstructive pulmonary disease

## 2021-08-29 DIAGNOSIS — R262 Difficulty in walking, not elsewhere classified: Secondary | ICD-10-CM | POA: Diagnosis not present

## 2021-08-29 DIAGNOSIS — R269 Unspecified abnormalities of gait and mobility: Secondary | ICD-10-CM | POA: Diagnosis not present

## 2021-08-29 DIAGNOSIS — Z9181 History of falling: Secondary | ICD-10-CM | POA: Diagnosis not present

## 2021-08-29 DIAGNOSIS — M545 Low back pain, unspecified: Secondary | ICD-10-CM | POA: Diagnosis not present

## 2021-08-29 DIAGNOSIS — R2689 Other abnormalities of gait and mobility: Secondary | ICD-10-CM | POA: Diagnosis not present

## 2021-08-29 DIAGNOSIS — R29898 Other symptoms and signs involving the musculoskeletal system: Secondary | ICD-10-CM | POA: Diagnosis not present

## 2021-08-29 DIAGNOSIS — G8929 Other chronic pain: Secondary | ICD-10-CM | POA: Diagnosis not present

## 2021-08-31 DIAGNOSIS — R0981 Nasal congestion: Secondary | ICD-10-CM | POA: Diagnosis not present

## 2021-08-31 DIAGNOSIS — R682 Dry mouth, unspecified: Secondary | ICD-10-CM | POA: Diagnosis not present

## 2021-08-31 DIAGNOSIS — R109 Unspecified abdominal pain: Secondary | ICD-10-CM | POA: Diagnosis not present

## 2021-08-31 DIAGNOSIS — E1165 Type 2 diabetes mellitus with hyperglycemia: Secondary | ICD-10-CM | POA: Diagnosis not present

## 2021-08-31 DIAGNOSIS — I959 Hypotension, unspecified: Secondary | ICD-10-CM | POA: Diagnosis not present

## 2021-08-31 DIAGNOSIS — K5901 Slow transit constipation: Secondary | ICD-10-CM | POA: Diagnosis not present

## 2021-09-02 DIAGNOSIS — R109 Unspecified abdominal pain: Secondary | ICD-10-CM | POA: Diagnosis not present

## 2021-09-02 DIAGNOSIS — N200 Calculus of kidney: Secondary | ICD-10-CM | POA: Diagnosis not present

## 2021-09-02 DIAGNOSIS — N1330 Unspecified hydronephrosis: Secondary | ICD-10-CM | POA: Diagnosis not present

## 2021-09-08 DIAGNOSIS — D72828 Other elevated white blood cell count: Secondary | ICD-10-CM | POA: Diagnosis not present

## 2021-09-08 DIAGNOSIS — N1330 Unspecified hydronephrosis: Secondary | ICD-10-CM | POA: Diagnosis not present

## 2021-09-08 DIAGNOSIS — N179 Acute kidney failure, unspecified: Secondary | ICD-10-CM | POA: Diagnosis not present

## 2021-09-15 DIAGNOSIS — G8929 Other chronic pain: Secondary | ICD-10-CM | POA: Diagnosis not present

## 2021-09-15 DIAGNOSIS — Z9181 History of falling: Secondary | ICD-10-CM | POA: Diagnosis not present

## 2021-09-15 DIAGNOSIS — M545 Low back pain, unspecified: Secondary | ICD-10-CM | POA: Diagnosis not present

## 2021-09-15 DIAGNOSIS — R269 Unspecified abnormalities of gait and mobility: Secondary | ICD-10-CM | POA: Diagnosis not present

## 2021-09-15 DIAGNOSIS — R29898 Other symptoms and signs involving the musculoskeletal system: Secondary | ICD-10-CM | POA: Diagnosis not present

## 2021-09-15 DIAGNOSIS — R2689 Other abnormalities of gait and mobility: Secondary | ICD-10-CM | POA: Diagnosis not present

## 2021-09-15 DIAGNOSIS — R262 Difficulty in walking, not elsewhere classified: Secondary | ICD-10-CM | POA: Diagnosis not present

## 2021-09-23 DIAGNOSIS — H6123 Impacted cerumen, bilateral: Secondary | ICD-10-CM | POA: Diagnosis not present

## 2021-10-07 DIAGNOSIS — D649 Anemia, unspecified: Secondary | ICD-10-CM | POA: Diagnosis not present

## 2021-10-27 ENCOUNTER — Encounter (INDEPENDENT_AMBULATORY_CARE_PROVIDER_SITE_OTHER): Payer: Medicare Other | Admitting: Ophthalmology

## 2021-11-04 ENCOUNTER — Other Ambulatory Visit (HOSPITAL_BASED_OUTPATIENT_CLINIC_OR_DEPARTMENT_OTHER): Payer: Self-pay

## 2021-11-04 ENCOUNTER — Emergency Department (HOSPITAL_BASED_OUTPATIENT_CLINIC_OR_DEPARTMENT_OTHER)
Admission: EM | Admit: 2021-11-04 | Discharge: 2021-11-04 | Disposition: A | Discharge status: Home / Self Care | Payer: Medicare Other | Attending: Emergency Medicine | Admitting: Emergency Medicine

## 2021-11-04 ENCOUNTER — Emergency Department (HOSPITAL_BASED_OUTPATIENT_CLINIC_OR_DEPARTMENT_OTHER): Payer: Medicare Other

## 2021-11-04 DIAGNOSIS — Z79899 Other long term (current) drug therapy: Secondary | ICD-10-CM | POA: Insufficient documentation

## 2021-11-04 DIAGNOSIS — J90 Pleural effusion, not elsewhere classified: Secondary | ICD-10-CM | POA: Diagnosis not present

## 2021-11-04 DIAGNOSIS — D649 Anemia, unspecified: Secondary | ICD-10-CM | POA: Diagnosis not present

## 2021-11-04 DIAGNOSIS — J40 Bronchitis, not specified as acute or chronic: Secondary | ICD-10-CM | POA: Diagnosis not present

## 2021-11-04 DIAGNOSIS — R600 Localized edema: Secondary | ICD-10-CM | POA: Diagnosis not present

## 2021-11-04 DIAGNOSIS — R0602 Shortness of breath: Secondary | ICD-10-CM

## 2021-11-04 DIAGNOSIS — R609 Edema, unspecified: Secondary | ICD-10-CM | POA: Insufficient documentation

## 2021-11-04 DIAGNOSIS — J9811 Atelectasis: Secondary | ICD-10-CM | POA: Diagnosis not present

## 2021-11-04 DIAGNOSIS — I3139 Other pericardial effusion (noninflammatory): Secondary | ICD-10-CM

## 2021-11-04 DIAGNOSIS — E119 Type 2 diabetes mellitus without complications: Secondary | ICD-10-CM | POA: Insufficient documentation

## 2021-11-04 DIAGNOSIS — I10 Essential (primary) hypertension: Secondary | ICD-10-CM | POA: Diagnosis not present

## 2021-11-04 DIAGNOSIS — Z7984 Long term (current) use of oral hypoglycemic drugs: Secondary | ICD-10-CM | POA: Diagnosis not present

## 2021-11-04 LAB — CBC
HCT: 31.6 % — ABNORMAL LOW (ref 39.0–52.0)
Hemoglobin: 10.3 g/dL — ABNORMAL LOW (ref 13.0–17.0)
MCH: 31.1 pg (ref 26.0–34.0)
MCHC: 32.6 g/dL (ref 30.0–36.0)
MCV: 95.5 fL (ref 80.0–100.0)
Platelets: 284 10*3/uL (ref 150–400)
RBC: 3.31 MIL/uL — ABNORMAL LOW (ref 4.22–5.81)
RDW: 13 % (ref 11.5–15.5)
WBC: 7.9 10*3/uL (ref 4.0–10.5)
nRBC: 0 % (ref 0.0–0.2)

## 2021-11-04 LAB — BASIC METABOLIC PANEL
Anion gap: 8 (ref 5–15)
BUN: 24 mg/dL — ABNORMAL HIGH (ref 8–23)
CO2: 26 mmol/L (ref 22–32)
Calcium: 8.5 mg/dL — ABNORMAL LOW (ref 8.9–10.3)
Chloride: 109 mmol/L (ref 98–111)
Creatinine, Ser: 1.83 mg/dL — ABNORMAL HIGH (ref 0.61–1.24)
GFR, Estimated: 37 mL/min — ABNORMAL LOW (ref >60)
Glucose, Bld: 152 mg/dL — ABNORMAL HIGH (ref 70–99)
Potassium: 3.9 mmol/L (ref 3.5–5.1)
Sodium: 143 mmol/L (ref 135–145)

## 2021-11-04 LAB — BRAIN NATRIURETIC PEPTIDE: B Natriuretic Peptide: 301.8 pg/mL — ABNORMAL HIGH (ref 0.0–100.0)

## 2021-11-04 LAB — TROPONIN I (HIGH SENSITIVITY)
Troponin I (High Sensitivity): 9 ng/L (ref <18)
Troponin I (High Sensitivity): 8 ng/L (ref <18)

## 2021-11-04 MED ORDER — CEPHALEXIN 500 MG PO CAPS
500.0000 mg | ORAL_CAPSULE | Freq: Two times a day (BID) | ORAL | 0 refills | Status: AC
  Filled 2021-11-04: qty 14, 7d supply, fill #0

## 2021-11-04 MED ORDER — FUROSEMIDE 10 MG/ML IJ SOLN
20.0000 mg | Freq: Once | INTRAMUSCULAR | Status: AC
  Administered 2021-11-04: 20 mg via INTRAVENOUS
  Filled 2021-11-04: qty 2

## 2021-11-04 MED ORDER — CEPHALEXIN 500 MG PO CAPS: 500.0000 mg | ORAL_CAPSULE | Freq: Two times a day (BID) | ORAL | 0 refills | Status: DC

## 2021-11-04 MED ORDER — DOXYCYCLINE HYCLATE 100 MG PO CAPS: 100.0000 mg | ORAL_CAPSULE | Freq: Two times a day (BID) | ORAL | 0 refills | Status: DC

## 2021-11-04 MED ORDER — DOXYCYCLINE HYCLATE 100 MG PO CAPS
100.0000 mg | ORAL_CAPSULE | Freq: Two times a day (BID) | ORAL | 0 refills | Status: AC
  Filled 2021-11-04: qty 14, 7d supply, fill #0

## 2021-11-04 MED ORDER — IOHEXOL 350 MG/ML SOLN
100.0000 mL | Freq: Once | INTRAVENOUS | Status: AC | PRN
  Administered 2021-11-04: 100 mL via INTRAVENOUS

## 2021-11-04 NOTE — ED Provider Notes (Signed)
[facility] Provider Note   CSN: [id number] Arrival date & time: 11/04/21  0831     History {Add pertinent medical, surgical, social history, OB history to HPI:1} Chief Complaint  Patient presents with  . Shortness of Breath  . Leg Swelling    bilat    David Mendoza is a 81 y.o. male.  HPI     Predominantly in right leg, starting in left leg now Have had swelling for years, had been using baby aspirin as blood thinner but taken off of that 2 months ago, had been ok with  compression socks. No pain, just swelling.  Hx of DVT in both legs.  Shortness of breath, carrying things up stairs will feel it, not feeling it at rest, don't have to do much to feel it Sleep in recliner so not sure if having orthopnea No cough or fever Some discomfort left chest. Felt something crack in neck, it comes and goes and is better with lifting arm about 10 days ago Dyspnea about the same , started maybe a little longer than one mmth ago  Was taken off of metformin     Past Medical History:  Diagnosis Date  . Chronic anticoagulation   . Diabetic retinopathy ([place])   . DVT (deep venous thrombosis) ([place])    x2  . Hyperlipemia   . Hypertension   . Multiple falls   . Nuclear sclerotic cataract of left eye 12/25/2019  . Nuclear sclerotic cataract of right eye 12/25/2019  . Peripheral neuropathy   . Posterior vitreous detachment of left eye 12/25/2019  . Sciatica      Home Medications Prior to Admission medications   Medication Sig Start Date End Date Taking? Authorizing Provider  amLODipine (NORVASC) 5 MG tablet Take 5 mg by mouth daily.    [provider]  carvedilol (COREG) 25 MG tablet Take 25 mg by mouth 2 (two) times daily. 05/13/21   [provider]  COVID-19 mRNA bivalent vaccine, Pfizer, (PFIZER COVID-19 VAC BIVALENT) injection Inject into the muscle. 12/22/20   Carlyle Basques, MD  Cyanocobalamin (B-12) 1000 MCG TBCR Take 1 tablet by  mouth daily. 07/28/21   [provider]  folic acid (FOLVITE) 696 MCG tablet Take 800 mcg by mouth daily. 07/28/21   [provider]  glipiZIDE (GLUCOTROL) 5 MG tablet Take 5 mg by mouth 2 (two) times daily.    [provider]  hydrochlorothiazide (HYDRODIURIL) 12.5 MG tablet Take 12.5 mg by mouth daily.    [provider]  losartan (COZAAR) 100 MG tablet Take 100 mg by mouth daily.    [provider]  losartan-hydrochlorothiazide (HYZAAR) 100-12.5 MG tablet Take by mouth daily. 07/04/21   [provider]  metFORMIN (GLUCOPHAGE) 500 MG tablet Take 1,000 mg by mouth 2 (two) times daily with a meal.    [provider]  omeprazole (PRILOSEC) 40 MG capsule Take 40 mg by mouth daily.    [provider]  rosuvastatin (CRESTOR) 20 MG tablet Take 20 mg by mouth daily.    [provider]  sertraline (ZOLOFT) 50 MG tablet Take 75 mg by mouth every morning. 05/13/21   [provider]      Allergies    Sulfa antibiotics    Review of Systems   Review of Systems  Physical Exam Updated Vital Signs BP 132/80   Pulse 63   Temp 98 F (36.7 C) (Oral)   Resp 18   Ht '5\' 10"'$  (1.778  m)   Wt 84.4 kg   SpO2 97%   BMI 26.69 kg/m  Physical Exam  ED Results / Procedures / Treatments   Labs (all labs ordered are listed, but only abnormal results are displayed) Labs Reviewed  BASIC METABOLIC PANEL - Abnormal; Notable for the following components:      Result Value   Glucose, Bld 152 (*)    BUN 24 (*)    Creatinine, Ser 1.83 (*)    Calcium 8.5 (*)    GFR, Estimated 37 (*)    All other components within normal limits  CBC - Abnormal; Notable for the following components:   RBC 3.31 (*)    Hemoglobin 10.3 (*)    HCT 31.6 (*)    All other components within normal limits  BRAIN NATRIURETIC PEPTIDE  TROPONIN I (HIGH SENSITIVITY)    EKG None  Radiology DG Chest Port 1 View  Result Date: 11/04/2021 CLINICAL  DATA:  Shortness of breath EXAM: PORTABLE CHEST 1 VIEW COMPARISON:  None Available. FINDINGS: Heart is borderline in size. Small right pleural effusion with right base atelectasis. Left lung clear. No edema. No acute bony abnormality. IMPRESSION: Small right pleural effusion with right base atelectasis. Electronically Signed   By: Rolm Baptise M.D.   On: 11/04/2021 09:18    Procedures Procedures  {Document cardiac monitor, telemetry assessment procedure when appropriate:1}  Medications Ordered in ED Medications - No data to display  ED Course/ Medical Decision Making/ A&P                           Medical Decision Making Amount and/or Complexity of Data Reviewed Labs: ordered. Radiology: ordered.   ***  {Document critical care time when appropriate:1} {Document review of labs and clinical decision tools ie heart score, Chads2Vasc2 etc:1}  {Document your independent review of radiology images, and any outside records:1} {Document your discussion with family members, caretakers, and with consultants:1} {Document social determinants of health affecting pt's care:1} {Document your decision making why or why not admission, treatments were needed:1} Final Clinical Impression(s) / ED Diagnoses Final diagnoses:  None    Rx / DC Orders ED Discharge Orders     None

## 2021-11-04 NOTE — Discharge Instructions (Addendum)
It was a pleasure caring for you today!  There are no signs of blood clots in either of your legs.  There is no evidence of blood clot on the lung.  You do have a small to moderate amount of fluid around your heart, as well as fluid around your lungs.  In addition, there is some thickening of your bronchial walls which may be infectious or also may be related to fluid.  We have given you some Lasix while you are in the emergency department, and recommend follow-up with cardiology with concern for extra fluid.  I have given you a prescription for antibiotics to take in case this is a bacterial infection on your lungs.  Please return to the emergency department if her symptoms are worsening.  We hope you feel better soon!

## 2021-11-04 NOTE — ED Triage Notes (Signed)
Pt to ED c/o bilat lower leg swelling and SHOB x 1 month. Reports SHOB worse with exertion.

## 2021-11-07 DIAGNOSIS — E1165 Type 2 diabetes mellitus with hyperglycemia: Secondary | ICD-10-CM | POA: Diagnosis not present

## 2021-11-07 DIAGNOSIS — K219 Gastro-esophageal reflux disease without esophagitis: Secondary | ICD-10-CM | POA: Diagnosis not present

## 2021-11-07 DIAGNOSIS — I10 Essential (primary) hypertension: Secondary | ICD-10-CM | POA: Diagnosis not present

## 2021-11-07 DIAGNOSIS — E782 Mixed hyperlipidemia: Secondary | ICD-10-CM | POA: Diagnosis not present

## 2021-11-11 DIAGNOSIS — H35372 Puckering of macula, left eye: Secondary | ICD-10-CM | POA: Diagnosis not present

## 2021-11-11 DIAGNOSIS — D3131 Benign neoplasm of right choroid: Secondary | ICD-10-CM | POA: Diagnosis not present

## 2021-11-11 DIAGNOSIS — E113313 Type 2 diabetes mellitus with moderate nonproliferative diabetic retinopathy with macular edema, bilateral: Secondary | ICD-10-CM | POA: Diagnosis not present

## 2021-11-11 DIAGNOSIS — H33322 Round hole, left eye: Secondary | ICD-10-CM | POA: Diagnosis not present

## 2021-11-11 DIAGNOSIS — H31091 Other chorioretinal scars, right eye: Secondary | ICD-10-CM | POA: Diagnosis not present

## 2021-11-11 DIAGNOSIS — E113311 Type 2 diabetes mellitus with moderate nonproliferative diabetic retinopathy with macular edema, right eye: Secondary | ICD-10-CM | POA: Diagnosis not present

## 2021-11-14 DIAGNOSIS — J90 Pleural effusion, not elsewhere classified: Secondary | ICD-10-CM | POA: Diagnosis not present

## 2021-11-14 DIAGNOSIS — Z86718 Personal history of other venous thrombosis and embolism: Secondary | ICD-10-CM | POA: Diagnosis not present

## 2021-11-14 DIAGNOSIS — R7989 Other specified abnormal findings of blood chemistry: Secondary | ICD-10-CM | POA: Diagnosis not present

## 2021-11-14 DIAGNOSIS — N1832 Chronic kidney disease, stage 3b: Secondary | ICD-10-CM | POA: Diagnosis not present

## 2021-11-14 DIAGNOSIS — I10 Essential (primary) hypertension: Secondary | ICD-10-CM | POA: Diagnosis not present

## 2021-11-14 DIAGNOSIS — I3139 Other pericardial effusion (noninflammatory): Secondary | ICD-10-CM | POA: Diagnosis not present

## 2021-11-14 DIAGNOSIS — R600 Localized edema: Secondary | ICD-10-CM | POA: Diagnosis not present

## 2021-11-14 DIAGNOSIS — D509 Iron deficiency anemia, unspecified: Secondary | ICD-10-CM | POA: Diagnosis not present

## 2021-11-28 ENCOUNTER — Other Ambulatory Visit: Payer: Self-pay | Admitting: Internal Medicine

## 2021-11-28 ENCOUNTER — Ambulatory Visit
Admission: RE | Admit: 2021-11-28 | Discharge: 2021-11-28 | Disposition: A | Discharge status: Home / Self Care | Payer: Medicare Other | Source: Ambulatory Visit | Attending: Internal Medicine | Admitting: Internal Medicine

## 2021-11-28 DIAGNOSIS — J90 Pleural effusion, not elsewhere classified: Secondary | ICD-10-CM

## 2021-11-28 DIAGNOSIS — I3139 Other pericardial effusion (noninflammatory): Secondary | ICD-10-CM | POA: Diagnosis not present

## 2021-11-28 DIAGNOSIS — Z86718 Personal history of other venous thrombosis and embolism: Secondary | ICD-10-CM | POA: Diagnosis not present

## 2021-11-28 DIAGNOSIS — R600 Localized edema: Secondary | ICD-10-CM | POA: Diagnosis not present

## 2021-11-28 DIAGNOSIS — N1832 Chronic kidney disease, stage 3b: Secondary | ICD-10-CM | POA: Diagnosis not present

## 2021-11-28 DIAGNOSIS — D509 Iron deficiency anemia, unspecified: Secondary | ICD-10-CM | POA: Diagnosis not present

## 2021-11-28 DIAGNOSIS — I10 Essential (primary) hypertension: Secondary | ICD-10-CM | POA: Diagnosis not present

## 2021-11-28 DIAGNOSIS — E119 Type 2 diabetes mellitus without complications: Secondary | ICD-10-CM | POA: Diagnosis not present

## 2021-11-28 DIAGNOSIS — R7989 Other specified abnormal findings of blood chemistry: Secondary | ICD-10-CM | POA: Diagnosis not present

## 2021-11-29 ENCOUNTER — Ambulatory Visit: Payer: Medicare Other | Attending: Cardiovascular Disease | Admitting: Cardiovascular Disease

## 2021-11-29 ENCOUNTER — Encounter: Payer: Self-pay | Admitting: Cardiovascular Disease

## 2021-11-29 VITALS — BP 136/66 | HR 55 | Ht 70.0 in | Wt 186.0 lb

## 2021-11-29 DIAGNOSIS — R2689 Other abnormalities of gait and mobility: Secondary | ICD-10-CM | POA: Diagnosis not present

## 2021-11-29 DIAGNOSIS — R296 Repeated falls: Secondary | ICD-10-CM

## 2021-11-29 DIAGNOSIS — R600 Localized edema: Secondary | ICD-10-CM

## 2021-11-29 NOTE — Progress Notes (Signed)
Cardiology Office Note:    Date:  11/29/2021   ID:  David Mendoza, DOB [birth year], MRN [id number]  PCP:  Kathalene Frames, MD   [place] Prov[facility] Cardiologist:  Alvie Speltz    Referring MD: Gareth Morgan, MD   Chief Complaint  Patient presents with   Hypertension        Hyperlipidemia    History of Present Illness:    David Mendoza is a 81 y.o. male with a hx of HTN, DVT, HLD   Went to [facility] ER with leg swelling  Venous duplex showed no  DVT  CTA / PE protocol  - no pulmonary embolus  Moderate pericardial effusion , moderate bilateral pleural effusion   Gave him lasix which has helped  Leg swelling is better  Breathing is better .  Eats a fair amount of fast food and take out . Some processed foods  BP is generally elevated , but better in the last several days  Was walking on treadmill, but stopped due to balance issues  No CP with walking  No cp,  No syncope   He falls on occasion.   He is not exercising because of his risk of falling .   Had a fall on the ice in 2016 and had a subdural hematoma  Hx of a DVT after a racquet ball injury  A second DVT several months later  Is not   Does not snore, no apnea    He was previously on Lasix 20 mg a day.  His creatinine increased to 1.83.  Lasix was reduced to 20 mg every other day yesterday.  His son , David Mendoza, played for the [employer]      Past Medical History:  Diagnosis Date   Chronic anticoagulation    Diabetic retinopathy ([place])    DVT (deep venous thrombosis) ([place])    x2   Hyperlipemia    Hypertension    Multiple falls    Nuclear sclerotic cataract of left eye 12/25/2019   Nuclear sclerotic cataract of right eye 12/25/2019   Peripheral neuropathy    Posterior vitreous detachment of left eye 12/25/2019   Sciatica     Past Surgical History:  Procedure Laterality Date   CRANIOTOMY Bilateral 04/07/2014   Procedure: BILATERAL CRANIOTOMY HEMATOMA EVACUATION  SUBDURAL;  Surgeon: Karie Chimera, MD;  Location: [facility];  Service: Neurosurgery;  Laterality: Bilateral;   PILONIDAL CYST EXCISION      Current Medications: Current Meds  Medication Sig   amLODipine (NORVASC) 5 MG tablet Take 5 mg by mouth daily.   carvedilol (COREG) 25 MG tablet Take 25 mg by mouth 2 (two) times daily.   COVID-19 mRNA bivalent vaccine, Pfizer, (PFIZER COVID-19 VAC BIVALENT) injection Inject into the muscle.   Cyanocobalamin (B-12) 1000 MCG TBCR Take 1 tablet by mouth daily.   ferrous gluconate (FERGON) 324 MG tablet Take 324 mg by mouth every other day.   folic acid (FOLVITE) 865 MCG tablet Take 800 mcg by mouth daily.   furosemide (LASIX) 20 MG tablet Take 20 mg by mouth every other day.   glipiZIDE (GLUCOTROL XL) 2.5 MG 24 hr tablet Take 2.5 mg by mouth in the morning and at bedtime.   losartan (COZAAR) 100 MG tablet Take 100 mg by mouth daily.   metFORMIN (GLUCOPHAGE) 500 MG tablet Take 500 mg by mouth 2 (two) times daily with a meal.   omeprazole (PRILOSEC) 40 MG capsule Take 40 mg by mouth daily.  sertraline (ZOLOFT) 50 MG tablet Take 75 mg by mouth every morning.   simvastatin (ZOCOR) 40 MG tablet Take 40 mg by mouth every evening.   [DISCONTINUED] losartan-hydrochlorothiazide (HYZAAR) 100-12.5 MG tablet Take by mouth daily.   [DISCONTINUED] rosuvastatin (CRESTOR) 20 MG tablet Take 20 mg by mouth daily.     Allergies:   Sulfa antibiotics   Social History   Socioeconomic History   Marital status: Married    Spouse name: Vicky   Number of children: Not on file   Years of education: Not on file   Highest education level: Not on file  Occupational History   Not on file  Tobacco Use   Smoking status: Former    Types: Cigarettes   Smokeless tobacco: Never   Tobacco comments:    Quit smoking 50 yrs ago  Substance and Sexual Activity   Alcohol use: Yes    Comment: occas   Drug use: Never   Sexual activity: Not on file  Other Topics Concern   Not  on file  Social History Narrative   Lives with wife   Social Determinants of Health   Financial Resource Strain: Not on file  Food Insecurity: Not on file  Transportation Needs: Not on file  Physical Activity: Not on file  Stress: Not on file  Social Connections: Not on file     Family History: The patient's family history is not on file.  ROS:   Please see the history of present illness.     All other systems reviewed and are negative.  EKGs/Labs/Other Studies Reviewed:    The following studies were reviewed today:   EKG:    Recent Labs: 11/04/2021: B Natriuretic Peptide 301.8; BUN 24; Creatinine, Ser 1.83; Hemoglobin 10.3; Platelets 284; Potassium 3.9; Sodium 143  Recent Lipid Panel No results found for: "CHOL", "TRIG", "HDL", "CHOLHDL", "VLDL", "LDLCALC", "LDLDIRECT"   Risk Assessment/Calculations:                Physical Exam:    VS:  BP 136/66   Pulse (!) 55   Ht '5\' 10"'$  (1.778 m)   Wt 186 lb (84.4 kg)   SpO2 94%   BMI 26.69 kg/m     Wt Readings from Last 3 Encounters:  11/29/21 186 lb (84.4 kg)  11/04/21 186 lb (84.4 kg)  08/03/21 185 lb (83.9 kg)     GEN:  Well nourished, well developed in no acute distress HEENT: Normal NECK: No JVD; No carotid bruits LYMPHATICS: No lymphadenopathy CARDIAC: RRR, no murmurs, rubs, gallops RESPIRATORY:  Clear to auscultation without rales, wheezing or rhonchi  ABDOMEN: Soft, non-tender, non-distended MUSCULOSKELETAL:  2-3 + pitting edema , tense ,  up to thighs ,   SKIN: Warm and dry NEUROLOGIC:  Alert and oriented x 3 PSYCHIATRIC:  Normal affect   ASSESSMENT:    1. Bilateral leg edema   2. Frequent falls   3. Balance problem    PLAN:    In order of problems listed above:  Leg edema: Elier presents for further evaluation and management of his significant leg edema.  His edema has improved quite a bit on Lasix 20 mg a day.  Unfortunately his creatinine is gone up to 1.8.  His Lasix was reduced to 20  mg every other day yesterday. He is still urinating fairly well.  Agree with continuing Lasix 20 mg every other day. Will give him information on how to order a lounge doctor leg rest.  I think that additional  leg elevation would help with his leg edema.  Will get an echocardiogram for further evaluation of his RV function.  He does not snore and does not have apneic episodes.  I do not think that he has significant sleep apnea.   I will see him back in the 4 to 6 weeks.  Will get a basic metabolic profile at that time.             Medication Adjustments/Labs and Tests Ordered: Current medicines are reviewed at length with the patient today.  Concerns regarding medicines are outlined above.  Orders Placed This Encounter  Procedures   Basic metabolic panel   Ambulatory referral to Physical Therapy   ECHOCARDIOGRAM COMPLETE   No orders of the defined types were placed in this encounter.   Patient Instructions  Medication Instructions:  Your physician recommends that you continue on your current medications as directed. Please refer to the Current Medication list given to you today.  *If you need a refill on your cardiac medications before your next appointment, please call your pharmacy*   Lab Work: BMET in 4-6 weeks at next office visit If you have labs (blood work) drawn today and your tests are completely normal, you will receive your results only by: [place] (if you have MyChart) OR A paper copy in the mail If you have any lab test that is abnormal or we need to change your treatment, we will call you to review the results.   Testing/Procedures: ECHO Your physician has requested that you have an echocardiogram. Echocardiography is a painless test that uses sound waves to create images of your heart. It provides your doctor with information about the size and shape of your heart and how well your heart's chambers and valves are working. This procedure takes  approximately one hour. There are no restrictions for this procedure. Please do NOT wear cologne, perfume, aftershave, or lotions (deodorant is allowed). Please arrive 15 minutes prior to your appointment time.  Follow-Up: At [facility], you and your health needs are our priority.  As part of our continuing mission to provide you with exceptional heart care, we have created designated Provider Care Teams.  These Care Teams include your primary Cardiologist (physician) and Advanced Practice Providers (APPs -  Physician Assistants and Nurse Practitioners) who all work together to provide you with the care you need, when you need it.  We recommend signing up for the patient portal called "MyChart".  Sign up information is provided on this After Visit Summary.  MyChart is used to connect with patients for Virtual Visits (Telemedicine).  Patients are able to view lab/test results, encounter notes, upcoming appointments, etc.  Non-urgent messages can be sent to your provider as well.   To learn more about what you can do with MyChart, go to [email or web address].    Your next appointment:   5 week(s)  The format for your next appointment:   In Person  Provider:   Mertie Moores, MD    Important Information About Sugar        For your  leg edema you  should do  the following 1. Leg elevation - I recommend the Lounge Dr. Leg rest.  See below for details  2. Salt restriction  -  Use potassium chloride instead of regular salt as a salt substitute. 3. Walk regularly 4. Compression hose - Medical Supply store  5. Weight loss    Available on [email or web address] Or  Go to [email or web address]  Signed, Mertie Moores, MD  11/29/2021 5:53 PM    [place]

## 2021-11-29 NOTE — Patient Instructions (Signed)
Medication Instructions:  Your physician recommends that you continue on your current medications as directed. Please refer to the Current Medication list given to you today.  *If you need a refill on your cardiac medications before your next appointment, please call your pharmacy*   Lab Work: BMET in 4-6 weeks at next office visit If you have labs (blood work) drawn today and your tests are completely normal, you will receive your results only by: [place] (if you have MyChart) OR A paper copy in the mail If you have any lab test that is abnormal or we need to change your treatment, we will call you to review the results.   Testing/Procedures: ECHO Your physician has requested that you have an echocardiogram. Echocardiography is a painless test that uses sound waves to create images of your heart. It provides your doctor with information about the size and shape of your heart and how well your heart's chambers and valves are working. This procedure takes approximately one hour. There are no restrictions for this procedure. Please do NOT wear cologne, perfume, aftershave, or lotions (deodorant is allowed). Please arrive 15 minutes prior to your appointment time.  Follow-Up: At [facility], you and your health needs are our priority.  As part of our continuing mission to provide you with exceptional heart care, we have created designated Provider Care Teams.  These Care Teams include your primary Cardiologist (physician) and Advanced Practice Providers (APPs -  Physician Assistants and Nurse Practitioners) who all work together to provide you with the care you need, when you need it.  We recommend signing up for the patient portal called "MyChart".  Sign up information is provided on this After Visit Summary.  MyChart is used to connect with patients for Virtual Visits (Telemedicine).  Patients are able to view lab/test results, encounter notes, upcoming appointments, etc.   Non-urgent messages can be sent to your provider as well.   To learn more about what you can do with MyChart, go to [email or web address].    Your next appointment:   5 week(s)  The format for your next appointment:   In Person  Provider:   Mertie Moores, MD    Important Information About Sugar        For your  leg edema you  should do  the following 1. Leg elevation - I recommend the Lounge Dr. Leg rest.  See below for details  2. Salt restriction  -  Use potassium chloride instead of regular salt as a salt substitute. 3. Walk regularly 4. Compression hose - Medical Supply store  5. Weight loss    Available on [email or web address] Or  Go to [email or web address]

## 2021-12-14 ENCOUNTER — Encounter: Payer: Self-pay | Admitting: Cardiovascular Disease

## 2021-12-20 ENCOUNTER — Encounter: Payer: Self-pay | Admitting: Cardiovascular Disease

## 2021-12-23 ENCOUNTER — Ambulatory Visit (HOSPITAL_COMMUNITY): Payer: Medicare Other | Attending: Cardiology

## 2021-12-23 ENCOUNTER — Ambulatory Visit: Payer: Medicare Other

## 2021-12-23 DIAGNOSIS — R2689 Other abnormalities of gait and mobility: Secondary | ICD-10-CM

## 2021-12-23 DIAGNOSIS — R600 Localized edema: Secondary | ICD-10-CM

## 2021-12-23 DIAGNOSIS — R296 Repeated falls: Secondary | ICD-10-CM

## 2021-12-23 LAB — BASIC METABOLIC PANEL
BUN/Creatinine Ratio: 14 (ref 10–24)
BUN: 26 mg/dL (ref 8–27)
CO2: 24 mmol/L (ref 20–29)
Calcium: 8.6 mg/dL (ref 8.6–10.2)
Chloride: 105 mmol/L (ref 96–106)
Creatinine, Ser: 1.9 mg/dL — ABNORMAL HIGH (ref 0.76–1.27)
Glucose: 152 mg/dL — ABNORMAL HIGH (ref 70–99)
Potassium: 4.1 mmol/L (ref 3.5–5.2)
Sodium: 141 mmol/L (ref 134–144)
eGFR: 35 mL/min/{1.73_m2} — ABNORMAL LOW (ref >59)

## 2021-12-23 LAB — ECHOCARDIOGRAM COMPLETE
Area-P 1/2: 3.19 cm2
S' Lateral: 3.9 cm

## 2021-12-27 ENCOUNTER — Other Ambulatory Visit: Payer: Medicare Other

## 2021-12-27 ENCOUNTER — Ambulatory Visit: Payer: Medicare Other | Admitting: Cardiovascular Disease

## 2021-12-29 ENCOUNTER — Ambulatory Visit: Payer: Medicare Other | Admitting: Cardiovascular Disease

## 2022-01-06 ENCOUNTER — Telehealth: Payer: Self-pay | Admitting: Cardiovascular Disease

## 2022-01-06 NOTE — Telephone Encounter (Signed)
Pt calling because he would like his lab results from 12/15 mailed to him

## 2022-01-06 NOTE — Telephone Encounter (Signed)
Per 12/23/21, letter w/results were mailed

## 2022-01-14 ENCOUNTER — Other Ambulatory Visit: Payer: Self-pay

## 2022-01-14 ENCOUNTER — Inpatient Hospital Stay (HOSPITAL_BASED_OUTPATIENT_CLINIC_OR_DEPARTMENT_OTHER)
Admission: EM | Admit: 2022-01-14 | Discharge: 2022-01-17 | DRG: 193 | Disposition: A | Discharge status: Home / Self Care | Payer: Medicare Other | Attending: Internal Medicine | Admitting: Internal Medicine

## 2022-01-14 ENCOUNTER — Emergency Department (HOSPITAL_BASED_OUTPATIENT_CLINIC_OR_DEPARTMENT_OTHER): Payer: Medicare Other

## 2022-01-14 ENCOUNTER — Encounter (HOSPITAL_BASED_OUTPATIENT_CLINIC_OR_DEPARTMENT_OTHER): Payer: Self-pay | Admitting: Emergency Medicine

## 2022-01-14 DIAGNOSIS — F32A Depression, unspecified: Secondary | ICD-10-CM | POA: Diagnosis not present

## 2022-01-14 DIAGNOSIS — J9809 Other diseases of bronchus, not elsewhere classified: Secondary | ICD-10-CM | POA: Diagnosis not present

## 2022-01-14 DIAGNOSIS — M7989 Other specified soft tissue disorders: Secondary | ICD-10-CM | POA: Diagnosis not present

## 2022-01-14 DIAGNOSIS — R600 Localized edema: Secondary | ICD-10-CM | POA: Diagnosis present

## 2022-01-14 DIAGNOSIS — F419 Anxiety disorder, unspecified: Secondary | ICD-10-CM | POA: Diagnosis present

## 2022-01-14 DIAGNOSIS — Z86718 Personal history of other venous thrombosis and embolism: Secondary | ICD-10-CM

## 2022-01-14 DIAGNOSIS — N1832 Chronic kidney disease, stage 3b: Secondary | ICD-10-CM | POA: Diagnosis present

## 2022-01-14 DIAGNOSIS — N179 Acute kidney failure, unspecified: Secondary | ICD-10-CM | POA: Diagnosis not present

## 2022-01-14 DIAGNOSIS — Z7984 Long term (current) use of oral hypoglycemic drugs: Secondary | ICD-10-CM | POA: Diagnosis not present

## 2022-01-14 DIAGNOSIS — J189 Pneumonia, unspecified organism: Secondary | ICD-10-CM | POA: Diagnosis present

## 2022-01-14 DIAGNOSIS — E785 Hyperlipidemia, unspecified: Secondary | ICD-10-CM | POA: Diagnosis not present

## 2022-01-14 DIAGNOSIS — J90 Pleural effusion, not elsewhere classified: Secondary | ICD-10-CM | POA: Diagnosis not present

## 2022-01-14 DIAGNOSIS — R079 Chest pain, unspecified: Secondary | ICD-10-CM | POA: Diagnosis not present

## 2022-01-14 DIAGNOSIS — Z87891 Personal history of nicotine dependence: Secondary | ICD-10-CM | POA: Diagnosis not present

## 2022-01-14 DIAGNOSIS — K219 Gastro-esophageal reflux disease without esophagitis: Secondary | ICD-10-CM | POA: Diagnosis not present

## 2022-01-14 DIAGNOSIS — E1165 Type 2 diabetes mellitus with hyperglycemia: Secondary | ICD-10-CM | POA: Diagnosis present

## 2022-01-14 DIAGNOSIS — Z1152 Encounter for screening for COVID-19: Secondary | ICD-10-CM | POA: Diagnosis not present

## 2022-01-14 DIAGNOSIS — Z79899 Other long term (current) drug therapy: Secondary | ICD-10-CM

## 2022-01-14 DIAGNOSIS — E1122 Type 2 diabetes mellitus with diabetic chronic kidney disease: Secondary | ICD-10-CM | POA: Diagnosis not present

## 2022-01-14 DIAGNOSIS — E11319 Type 2 diabetes mellitus with unspecified diabetic retinopathy without macular edema: Secondary | ICD-10-CM | POA: Diagnosis present

## 2022-01-14 DIAGNOSIS — J188 Other pneumonia, unspecified organism: Secondary | ICD-10-CM | POA: Diagnosis present

## 2022-01-14 DIAGNOSIS — I129 Hypertensive chronic kidney disease with stage 1 through stage 4 chronic kidney disease, or unspecified chronic kidney disease: Secondary | ICD-10-CM | POA: Diagnosis not present

## 2022-01-14 DIAGNOSIS — I10 Essential (primary) hypertension: Secondary | ICD-10-CM | POA: Diagnosis present

## 2022-01-14 DIAGNOSIS — J9601 Acute respiratory failure with hypoxia: Principal | ICD-10-CM | POA: Diagnosis present

## 2022-01-14 DIAGNOSIS — I272 Pulmonary hypertension, unspecified: Secondary | ICD-10-CM | POA: Diagnosis not present

## 2022-01-14 DIAGNOSIS — Z882 Allergy status to sulfonamides status: Secondary | ICD-10-CM

## 2022-01-14 DIAGNOSIS — I2699 Other pulmonary embolism without acute cor pulmonale: Secondary | ICD-10-CM | POA: Diagnosis not present

## 2022-01-14 DIAGNOSIS — E1142 Type 2 diabetes mellitus with diabetic polyneuropathy: Secondary | ICD-10-CM | POA: Diagnosis present

## 2022-01-14 DIAGNOSIS — R0602 Shortness of breath: Secondary | ICD-10-CM | POA: Diagnosis not present

## 2022-01-14 DIAGNOSIS — R059 Cough, unspecified: Secondary | ICD-10-CM | POA: Diagnosis not present

## 2022-01-14 DIAGNOSIS — J811 Chronic pulmonary edema: Secondary | ICD-10-CM | POA: Diagnosis not present

## 2022-01-14 DIAGNOSIS — Z743 Need for continuous supervision: Secondary | ICD-10-CM | POA: Diagnosis not present

## 2022-01-14 DIAGNOSIS — R739 Hyperglycemia, unspecified: Secondary | ICD-10-CM | POA: Diagnosis not present

## 2022-01-14 DIAGNOSIS — E119 Type 2 diabetes mellitus without complications: Secondary | ICD-10-CM

## 2022-01-14 LAB — CBC
HCT: 33.5 % — ABNORMAL LOW (ref 39.0–52.0)
Hemoglobin: 11 g/dL — ABNORMAL LOW (ref 13.0–17.0)
MCH: 31 pg (ref 26.0–34.0)
MCHC: 32.8 g/dL (ref 30.0–36.0)
MCV: 94.4 fL (ref 80.0–100.0)
Platelets: 270 10*3/uL (ref 150–400)
RBC: 3.55 MIL/uL — ABNORMAL LOW (ref 4.22–5.81)
RDW: 13.4 % (ref 11.5–15.5)
WBC: 17 10*3/uL — ABNORMAL HIGH (ref 4.0–10.5)
nRBC: 0 % (ref 0.0–0.2)

## 2022-01-14 LAB — BASIC METABOLIC PANEL
Anion gap: 12 (ref 5–15)
BUN: 29 mg/dL — ABNORMAL HIGH (ref 8–23)
CO2: 24 mmol/L (ref 22–32)
Calcium: 8.8 mg/dL — ABNORMAL LOW (ref 8.9–10.3)
Chloride: 106 mmol/L (ref 98–111)
Creatinine, Ser: 2.23 mg/dL — ABNORMAL HIGH (ref 0.61–1.24)
GFR, Estimated: 29 mL/min — ABNORMAL LOW (ref >60)
Glucose, Bld: 231 mg/dL — ABNORMAL HIGH (ref 70–99)
Potassium: 4 mmol/L (ref 3.5–5.1)
Sodium: 142 mmol/L (ref 135–145)

## 2022-01-14 LAB — TROPONIN I (HIGH SENSITIVITY): Troponin I (High Sensitivity): 12 ng/L (ref <18)

## 2022-01-14 LAB — PROTIME-INR
INR: 1.1 (ref 0.8–1.2)
Prothrombin Time: 13.8 seconds (ref 11.4–15.2)

## 2022-01-14 LAB — LACTIC ACID, PLASMA: Lactic Acid, Venous: 0.8 mmol/L (ref 0.5–1.9)

## 2022-01-14 LAB — D-DIMER, QUANTITATIVE: D-Dimer, Quant: 2.08 ug/mL-FEU — ABNORMAL HIGH (ref 0.00–0.50)

## 2022-01-14 LAB — RESP PANEL BY RT-PCR (RSV, FLU A&B, COVID)  RVPGX2
Influenza A by PCR: NEGATIVE
Influenza B by PCR: NEGATIVE
Resp Syncytial Virus by PCR: NEGATIVE
SARS Coronavirus 2 by RT PCR: NEGATIVE

## 2022-01-14 LAB — BRAIN NATRIURETIC PEPTIDE: B Natriuretic Peptide: 630.9 pg/mL — ABNORMAL HIGH (ref 0.0–100.0)

## 2022-01-14 MED ORDER — SODIUM CHLORIDE 0.9 % IV SOLN
2.0000 g | Freq: Once | INTRAVENOUS | Status: AC
  Administered 2022-01-15: 2 g via INTRAVENOUS
  Filled 2022-01-14: qty 20

## 2022-01-14 MED ORDER — SODIUM CHLORIDE 0.9 % IV SOLN
500.0000 mg | Freq: Once | INTRAVENOUS | Status: AC
  Administered 2022-01-15: 500 mg via INTRAVENOUS
  Filled 2022-01-14: qty 5

## 2022-01-14 MED ORDER — SODIUM CHLORIDE 0.9 % IV BOLUS
500.0000 mL | Freq: Once | INTRAVENOUS | Status: AC
  Administered 2022-01-14: 500 mL via INTRAVENOUS

## 2022-01-14 NOTE — ED Notes (Signed)
RT called to triage to assess EMS pt for SOB. Pt on [place] 6 Lpm w/a sat of 91%, tachypneic at 38 bpm, BLBS clr dim/dim. Pt unable to speak in full complete sentences. Pt respiratory status stable w/increased WOB. RT will continue to monitor while at [facility] ED.

## 2022-01-14 NOTE — ED Triage Notes (Signed)
Per ems: pt has had cold symptoms x 1 week; when ems arrived, pt was 80% on RA. Placed on 4L via [place] and improved to 94%. Pt arrives on 6L; O2 sat 90%> Pt alert & oriented, nad noted.

## 2022-01-14 NOTE — ED Provider Notes (Addendum)
[facility] Emergency Department Provider Note MRN:  [id number]  Arrival date & time: 01/15/22     Chief Complaint   Shortness of Breath   History of Present Illness   David Mendoza is a 82 y.o. year-old male with a history of DVT presenting to the ED with chief complaint of shortness of breath.  Cough and cold-like symptoms for the past week, now with shortness of breath today.  Denies chest pain.  Some increase in lower extremity edema over the past few days.  No abdominal pain.  No fever.  Review of Systems  A thorough review of systems was obtained and all systems are negative except as noted in the HPI and PMH.   Patient's Health History    Past Medical History:  Diagnosis Date   Chronic anticoagulation    Diabetic retinopathy ([employer])    DVT (deep venous thrombosis) ([place])    x2   Hyperlipemia    Hypertension    Multiple falls    Nuclear sclerotic cataract of left eye 12/25/2019   Nuclear sclerotic cataract of right eye 12/25/2019   Peripheral neuropathy    Posterior vitreous detachment of left eye 12/25/2019   Sciatica     Past Surgical History:  Procedure Laterality Date   CRANIOTOMY Bilateral 04/07/2014   Procedure: BILATERAL CRANIOTOMY HEMATOMA EVACUATION SUBDURAL;  Surgeon: Karie Chimera, MD;  Location: [place] [facility];  Service: Neurosurgery;  Laterality: Bilateral;   PILONIDAL CYST EXCISION      History reviewed. No pertinent family history.  Social History   Socioeconomic History   Marital status: Married    Spouse name: Vicky   Number of children: Not on file   Years of education: Not on file   Highest education level: Not on file  Occupational History   Not on file  Tobacco Use   Smoking status: Former    Types: Cigarettes   Smokeless tobacco: Never   Tobacco comments:    Quit smoking 50 yrs ago  Vaping Use   Vaping Use: Never used  Substance and Sexual Activity   Alcohol use: Yes    Comment: occas   Drug use: Never    Sexual activity: Not on file  Other Topics Concern   Not on file  Social History Narrative   ** Merged History Encounter **       Lives with wife   Social Determinants of Health   Financial Resource Strain: Not on file  Food Insecurity: Not on file  Transportation Needs: Not on file  Physical Activity: Not on file  Stress: Not on file  Social Connections: Not on file  Intimate Partner Violence: Not on file     Physical Exam   Vitals:   01/14/22 2303 01/14/22 2312  BP: (!) 176/86 (!) 176/86  Pulse: 82 79  Resp:  (!) 24  Temp:    SpO2: 93% 96%    CONSTITUTIONAL: Well-appearing, NAD NEURO/PSYCH:  Alert and oriented x 3, no focal deficits EYES:  eyes equal and reactive ENT/NECK:  no LAD, no JVD CARDIO: Regular rate, well-perfused, normal S1 and S2 PULM: Diminished breath sounds, occasional crackles heard on the right GI/GU:  non-distended, non-tender MSK/SPINE:  No gross deformities, no edema SKIN:  no rash, atraumatic   *Additional and/or pertinent findings included in MDM below  Diagnostic and Interventional Summary    EKG Interpretation  Date/Time:  Saturday January 14 2022 22:18:03 EST Ventricular Rate:  79 PR Interval:  214 QRS Duration: 144  QT Interval:  438 QTC Calculation: 502 R Axis:   88 Text Interpretation: Sinus rhythm with 1st degree A-V block Left bundle branch block Abnormal ECG When compared with ECG of 04-Nov-2021 08:37, No significant change was found Confirmed by Gerlene Fee [phone]) on 01/14/2022 11:02:50 PM       Labs Reviewed  BASIC METABOLIC PANEL - Abnormal; Notable for the following components:      Result Value   Glucose, Bld 231 (*)    BUN 29 (*)    Creatinine, Ser 2.23 (*)    Calcium 8.8 (*)    GFR, Estimated 29 (*)    All other components within normal limits  CBC - Abnormal; Notable for the following components:   WBC 17.0 (*)    RBC 3.55 (*)    Hemoglobin 11.0 (*)    HCT 33.5 (*)    All other components within normal  limits  BRAIN NATRIURETIC PEPTIDE - Abnormal; Notable for the following components:   B Natriuretic Peptide 630.9 (*)    All other components within normal limits  D-DIMER, QUANTITATIVE - Abnormal; Notable for the following components:   D-Dimer, Quant 2.08 (*)    All other components within normal limits  RESP PANEL BY RT-PCR (RSV, FLU A&B, COVID)  RVPGX2  CULTURE, BLOOD (ROUTINE X 2)  CULTURE, BLOOD (ROUTINE X 2)  PROTIME-INR  LACTIC ACID, PLASMA  LACTIC ACID, PLASMA  TROPONIN I (HIGH SENSITIVITY)  TROPONIN I (HIGH SENSITIVITY)    DG Chest Port 1 View  Final Result    CT Chest Wo Contrast    (Results Pending)    Medications  cefTRIAXone (ROCEPHIN) 2 g in sodium chloride 0.9 % 100 mL IVPB (2 g Intravenous New Bag/Given 01/15/22 0021)  azithromycin (ZITHROMAX) 500 mg in sodium chloride 0.9 % 250 mL IVPB (has no administration in time range)  sodium chloride 0.9 % bolus 500 mL (500 mLs Intravenous New Bag/Given 01/14/22 2334)     Procedures  /  Critical Care .Critical Care  Performed by: Maudie Flakes, MD Authorized by: Maudie Flakes, MD   Critical care provider statement:    Critical care time (minutes):  80   Critical care was necessary to treat or prevent imminent or life-threatening deterioration of the following conditions:  Respiratory failure   Critical care was time spent personally by me on the following activities:  Development of treatment plan with patient or surrogate, discussions with consultants, evaluation of patient's response to treatment, examination of patient, ordering and review of laboratory studies, ordering and review of radiographic studies, ordering and performing treatments and interventions, pulse oximetry, re-evaluation of patient's condition and review of old charts Ultrasound ED Echo  Date/Time: 01/15/2022 12:25 AM  Performed by: Maudie Flakes, MD Authorized by: Maudie Flakes, MD   Procedure details:    Indications: dyspnea     Views:  parasternal long axis view, parasternal short axis view and apical 4 chamber view     Images: archived     Limitations:  Body habitus Findings:    Pericardium comment:  Trace effusion   LV Function: normal (>50% EF)     RV Diameter: normal     ED Course and Medical Decision Making  Initial Impression and Ddx Suspicious for pneumonia, also considering CHF exacerbation, PE, pericardial effusion.  Patient was recently evaluated for PE back in October and a CT scan was negative.  Pericardial effusion was found during that CT.  However, patient followed up with echocardiogram  last month and there was trace effusion, fairly reassuring ejection fraction.  Given the prodrome of cough and cold-like symptoms, pneumonia is favored.  Starting antibiotics, awaiting x-ray.  Had considered CTA imaging however patient's renal function has declined now with a GFR of 29.  Patient requiring 6 L nasal cannula but is comfortable on this amount of supplemental oxygen.  Hemodynamically doing well.  Past medical/surgical history that increases complexity of ED encounter: Hypertension  Interpretation of Diagnostics I personally reviewed the EKG and my interpretation is as follows: Sinus rhythm, bundle branch block, no significant change from prior    Patient Reassessment and Ultimate Disposition/Management     Plan is for admission for hypoxic respiratory failure.  Bedside ultrasound without concerning features such as large effusion or significantly reduced EF.  I see no obvious indirect signs of PE.  Case discussed with Dr. Vallarie Mare of hospital service, given the lingering concern of PE we will start heparin, will also obtain CT Noncon of the chest for further information.  Otherwise he is excepted for admission and transfer.  Patient management required discussion with the following services or consulting groups:  Hospitalist Service  Complexity of Problems Addressed Acute illness or injury that poses threat of  life of bodily function  Additional Data Reviewed and Analyzed Further history obtained from: Further history from spouse/family member  Additional Factors Impacting ED Encounter Risk Consideration of hospitalization  Barth Kirks. Sedonia Small, [place] [email or web address]  Final Clinical Impressions(s) / ED Diagnoses     ICD-10-CM   1. Acute respiratory failure with hypoxia (HCC)  J96.01       ED Discharge Orders     None        Discharge Instructions Discussed with and Provided to Patient:   Discharge Instructions   None      Maudie Flakes, MD 01/14/22 [id number]    Maudie Flakes, MD 01/15/22 [phone]

## 2022-01-15 ENCOUNTER — Encounter (HOSPITAL_COMMUNITY): Payer: Self-pay

## 2022-01-15 ENCOUNTER — Emergency Department (HOSPITAL_BASED_OUTPATIENT_CLINIC_OR_DEPARTMENT_OTHER): Payer: Medicare Other

## 2022-01-15 ENCOUNTER — Observation Stay (HOSPITAL_COMMUNITY): Payer: Medicare Other

## 2022-01-15 DIAGNOSIS — E119 Type 2 diabetes mellitus without complications: Secondary | ICD-10-CM

## 2022-01-15 DIAGNOSIS — E1142 Type 2 diabetes mellitus with diabetic polyneuropathy: Secondary | ICD-10-CM | POA: Diagnosis present

## 2022-01-15 DIAGNOSIS — J9601 Acute respiratory failure with hypoxia: Principal | ICD-10-CM | POA: Diagnosis present

## 2022-01-15 DIAGNOSIS — F419 Anxiety disorder, unspecified: Secondary | ICD-10-CM | POA: Diagnosis present

## 2022-01-15 DIAGNOSIS — F32A Depression, unspecified: Secondary | ICD-10-CM | POA: Diagnosis present

## 2022-01-15 DIAGNOSIS — N1832 Chronic kidney disease, stage 3b: Secondary | ICD-10-CM | POA: Diagnosis present

## 2022-01-15 DIAGNOSIS — K219 Gastro-esophageal reflux disease without esophagitis: Secondary | ICD-10-CM | POA: Diagnosis present

## 2022-01-15 DIAGNOSIS — E11319 Type 2 diabetes mellitus with unspecified diabetic retinopathy without macular edema: Secondary | ICD-10-CM | POA: Diagnosis present

## 2022-01-15 DIAGNOSIS — I129 Hypertensive chronic kidney disease with stage 1 through stage 4 chronic kidney disease, or unspecified chronic kidney disease: Secondary | ICD-10-CM | POA: Diagnosis present

## 2022-01-15 DIAGNOSIS — J189 Pneumonia, unspecified organism: Secondary | ICD-10-CM | POA: Diagnosis present

## 2022-01-15 DIAGNOSIS — Z1152 Encounter for screening for COVID-19: Secondary | ICD-10-CM | POA: Diagnosis not present

## 2022-01-15 DIAGNOSIS — J90 Pleural effusion, not elsewhere classified: Secondary | ICD-10-CM | POA: Diagnosis not present

## 2022-01-15 DIAGNOSIS — Z7984 Long term (current) use of oral hypoglycemic drugs: Secondary | ICD-10-CM | POA: Diagnosis not present

## 2022-01-15 DIAGNOSIS — I272 Pulmonary hypertension, unspecified: Secondary | ICD-10-CM

## 2022-01-15 DIAGNOSIS — E1165 Type 2 diabetes mellitus with hyperglycemia: Secondary | ICD-10-CM | POA: Diagnosis present

## 2022-01-15 DIAGNOSIS — I2699 Other pulmonary embolism without acute cor pulmonale: Secondary | ICD-10-CM

## 2022-01-15 DIAGNOSIS — Z86718 Personal history of other venous thrombosis and embolism: Secondary | ICD-10-CM | POA: Diagnosis not present

## 2022-01-15 DIAGNOSIS — E785 Hyperlipidemia, unspecified: Secondary | ICD-10-CM | POA: Diagnosis present

## 2022-01-15 DIAGNOSIS — J9809 Other diseases of bronchus, not elsewhere classified: Secondary | ICD-10-CM | POA: Diagnosis not present

## 2022-01-15 DIAGNOSIS — Z87891 Personal history of nicotine dependence: Secondary | ICD-10-CM | POA: Diagnosis not present

## 2022-01-15 DIAGNOSIS — M7989 Other specified soft tissue disorders: Secondary | ICD-10-CM | POA: Diagnosis not present

## 2022-01-15 DIAGNOSIS — Z79899 Other long term (current) drug therapy: Secondary | ICD-10-CM | POA: Diagnosis not present

## 2022-01-15 DIAGNOSIS — I10 Essential (primary) hypertension: Secondary | ICD-10-CM | POA: Diagnosis present

## 2022-01-15 DIAGNOSIS — N179 Acute kidney failure, unspecified: Secondary | ICD-10-CM | POA: Diagnosis not present

## 2022-01-15 DIAGNOSIS — Z882 Allergy status to sulfonamides status: Secondary | ICD-10-CM | POA: Diagnosis not present

## 2022-01-15 DIAGNOSIS — R600 Localized edema: Secondary | ICD-10-CM | POA: Diagnosis present

## 2022-01-15 DIAGNOSIS — E1122 Type 2 diabetes mellitus with diabetic chronic kidney disease: Secondary | ICD-10-CM

## 2022-01-15 LAB — COMPREHENSIVE METABOLIC PANEL
ALT: 20 U/L (ref 0–44)
AST: 18 U/L (ref 15–41)
Albumin: 2.5 g/dL — ABNORMAL LOW (ref 3.5–5.0)
Alkaline Phosphatase: 113 U/L (ref 38–126)
Anion gap: 12 (ref 5–15)
BUN: 31 mg/dL — ABNORMAL HIGH (ref 8–23)
CO2: 18 mmol/L — ABNORMAL LOW (ref 22–32)
Calcium: 8.3 mg/dL — ABNORMAL LOW (ref 8.9–10.3)
Chloride: 110 mmol/L (ref 98–111)
Creatinine, Ser: 2.31 mg/dL — ABNORMAL HIGH (ref 0.61–1.24)
GFR, Estimated: 28 mL/min — ABNORMAL LOW (ref >60)
Glucose, Bld: 261 mg/dL — ABNORMAL HIGH (ref 70–99)
Potassium: 4 mmol/L (ref 3.5–5.1)
Sodium: 140 mmol/L (ref 135–145)
Total Bilirubin: 0.9 mg/dL (ref 0.3–1.2)
Total Protein: 7.4 g/dL (ref 6.5–8.1)

## 2022-01-15 LAB — RESPIRATORY PANEL BY PCR

## 2022-01-15 LAB — CBC
HCT: 33.2 % — ABNORMAL LOW (ref 39.0–52.0)
Hemoglobin: 10.8 g/dL — ABNORMAL LOW (ref 13.0–17.0)
MCH: 31.1 pg (ref 26.0–34.0)
MCHC: 32.5 g/dL (ref 30.0–36.0)
MCV: 95.7 fL (ref 80.0–100.0)
Platelets: 270 10*3/uL (ref 150–400)
RBC: 3.47 MIL/uL — ABNORMAL LOW (ref 4.22–5.81)
RDW: 13.4 % (ref 11.5–15.5)
WBC: 15.1 10*3/uL — ABNORMAL HIGH (ref 4.0–10.5)
nRBC: 0 % (ref 0.0–0.2)

## 2022-01-15 LAB — LACTIC ACID, PLASMA: Lactic Acid, Venous: 0.7 mmol/L (ref 0.5–1.9)

## 2022-01-15 LAB — ECHOCARDIOGRAM LIMITED
Calc EF: 64.2 %
Height: 70 in
S' Lateral: 3.2 cm
Single Plane A2C EF: 62.8 %
Single Plane A4C EF: 65.4 %
Weight: 3152 oz

## 2022-01-15 LAB — HEPARIN LEVEL (UNFRACTIONATED)
Heparin Unfractionated: 0.42 IU/mL (ref 0.30–0.70)
Heparin Unfractionated: 0.43 IU/mL (ref 0.30–0.70)

## 2022-01-15 LAB — HIV ANTIBODY (ROUTINE TESTING W REFLEX): HIV Screen 4th Generation wRfx: NONREACTIVE

## 2022-01-15 LAB — GLUCOSE, CAPILLARY
Glucose-Capillary: 240 mg/dL — ABNORMAL HIGH (ref 70–99)
Glucose-Capillary: 334 mg/dL — ABNORMAL HIGH (ref 70–99)
Glucose-Capillary: 275 mg/dL — ABNORMAL HIGH (ref 70–99)
Glucose-Capillary: 331 mg/dL — ABNORMAL HIGH (ref 70–99)

## 2022-01-15 LAB — TROPONIN I (HIGH SENSITIVITY): Troponin I (High Sensitivity): 13 ng/L (ref <18)

## 2022-01-15 LAB — STREP PNEUMONIAE URINARY ANTIGEN: Strep Pneumo Urinary Antigen: NEGATIVE — AB

## 2022-01-15 MED ORDER — CARVEDILOL 25 MG PO TABS
25.0000 mg | ORAL_TABLET | Freq: Two times a day (BID) | ORAL | Status: DC
  Administered 2022-01-15 – 2022-01-17 (×4): 25 mg via ORAL
  Filled 2022-01-15 (×4): qty 1

## 2022-01-15 MED ORDER — SODIUM CHLORIDE 0.9 % IV SOLN
500.0000 mg | INTRAVENOUS | Status: DC
  Administered 2022-01-15 – 2022-01-16 (×2): 500 mg via INTRAVENOUS
  Filled 2022-01-15 (×3): qty 5

## 2022-01-15 MED ORDER — HEPARIN BOLUS VIA INFUSION
3000.0000 [IU] | Freq: Once | INTRAVENOUS | Status: AC
  Administered 2022-01-15: 3000 [IU] via INTRAVENOUS

## 2022-01-15 MED ORDER — SERTRALINE HCL 50 MG PO TABS
75.0000 mg | ORAL_TABLET | Freq: Every morning | ORAL | Status: DC
  Administered 2022-01-15 – 2022-01-17 (×3): 75 mg via ORAL
  Filled 2022-01-15 (×3): qty 1

## 2022-01-15 MED ORDER — FUROSEMIDE 20 MG PO TABS
20.0000 mg | ORAL_TABLET | ORAL | Status: DC
  Administered 2022-01-15 – 2022-01-17 (×2): 20 mg via ORAL
  Filled 2022-01-15 (×2): qty 1

## 2022-01-15 MED ORDER — HEPARIN (PORCINE) 25000 UT/250ML-% IV SOLN
1300.0000 [IU]/h | INTRAVENOUS | Status: DC
  Administered 2022-01-15 (×2): 1300 [IU]/h via INTRAVENOUS
  Filled 2022-01-15 (×2): qty 250

## 2022-01-15 MED ORDER — LOSARTAN POTASSIUM 50 MG PO TABS
100.0000 mg | ORAL_TABLET | Freq: Every day | ORAL | Status: DC
  Administered 2022-01-15 – 2022-01-17 (×3): 100 mg via ORAL
  Filled 2022-01-15 (×4): qty 2

## 2022-01-15 MED ORDER — INSULIN ASPART 100 UNIT/ML IJ SOLN
0.0000 [IU] | Freq: Three times a day (TID) | INTRAMUSCULAR | Status: DC
  Administered 2022-01-15: 11 [IU] via SUBCUTANEOUS
  Administered 2022-01-16 – 2022-01-17 (×4): 3 [IU] via SUBCUTANEOUS

## 2022-01-15 MED ORDER — SIMVASTATIN 40 MG PO TABS
40.0000 mg | ORAL_TABLET | Freq: Every evening | ORAL | Status: DC
  Administered 2022-01-15 – 2022-01-16 (×2): 40 mg via ORAL
  Filled 2022-01-15 (×2): qty 1

## 2022-01-15 MED ORDER — VITAMIN B-12 1000 MCG PO TABS
1000.0000 ug | ORAL_TABLET | Freq: Every day | ORAL | Status: DC
  Administered 2022-01-15 – 2022-01-17 (×3): 1000 ug via ORAL
  Filled 2022-01-15 (×3): qty 1

## 2022-01-15 MED ORDER — FERROUS GLUCONATE 324 (38 FE) MG PO TABS
324.0000 mg | ORAL_TABLET | ORAL | Status: DC
  Administered 2022-01-15 – 2022-01-17 (×2): 324 mg via ORAL
  Filled 2022-01-15 (×2): qty 1

## 2022-01-15 MED ORDER — B-12 1000 MCG PO TBCR: 1.0000 | EXTENDED_RELEASE_TABLET | Freq: Every day | ORAL | Status: DC

## 2022-01-15 MED ORDER — FOLIC ACID 800 MCG PO TABS: 800.0000 ug | ORAL_TABLET | Freq: Every day | ORAL | Status: DC

## 2022-01-15 MED ORDER — ORAL CARE MOUTH RINSE: 15.0000 mL | OROMUCOSAL | Status: DC | PRN

## 2022-01-15 MED ORDER — PANTOPRAZOLE SODIUM 40 MG PO TBEC
40.0000 mg | DELAYED_RELEASE_TABLET | Freq: Every day | ORAL | Status: DC
  Administered 2022-01-15 – 2022-01-17 (×3): 40 mg via ORAL
  Filled 2022-01-15 (×3): qty 1

## 2022-01-15 MED ORDER — INSULIN ASPART 100 UNIT/ML IJ SOLN
0.0000 [IU] | Freq: Every day | INTRAMUSCULAR | Status: DC
  Administered 2022-01-15: 4 [IU] via SUBCUTANEOUS

## 2022-01-15 MED ORDER — AMLODIPINE BESYLATE 5 MG PO TABS
5.0000 mg | ORAL_TABLET | Freq: Every day | ORAL | Status: DC
  Administered 2022-01-15 – 2022-01-17 (×3): 5 mg via ORAL
  Filled 2022-01-15 (×3): qty 1

## 2022-01-15 MED ORDER — FOLIC ACID 1 MG PO TABS
1.0000 mg | ORAL_TABLET | Freq: Every day | ORAL | Status: DC
  Administered 2022-01-15 – 2022-01-17 (×3): 1 mg via ORAL
  Filled 2022-01-15 (×3): qty 1

## 2022-01-15 MED ORDER — SODIUM CHLORIDE 0.9 % IV SOLN
2.0000 g | INTRAVENOUS | Status: DC
  Administered 2022-01-15 – 2022-01-16 (×2): 2 g via INTRAVENOUS
  Filled 2022-01-15 (×2): qty 20

## 2022-01-15 NOTE — Progress Notes (Signed)
  Echocardiogram 2D Echocardiogram has been performed.  David Mendoza 01/15/2022, 9:22 AM

## 2022-01-15 NOTE — Assessment & Plan Note (Signed)
Covid, flu, RSV neg WBC elevated PNA pathway Check RVP Empiric rocephin + azithromycin Check urine for s.pneumo

## 2022-01-15 NOTE — Plan of Care (Signed)
   Patient Name: David Mendoza, David Mendoza DOB: April 08, 1940 KKD:[id number] Transferring facility: [facility] Requesting provider: Sedonia Small, md Reason for transfer: acute respiratory failure with hypoxia 82 yo WM with cough, cold symptoms for 1 week. prior hx of DVT. not on systemic AC.  on presentation to ER, RA sats 80%. on 4-6 L/min. RSV/influenza/covid negative. SCr 2.23.  D-dimer 2.0 GFR <29. BNP 630. advised to start IV heparin since we cannot rule out PE.  recommend CT chest without contrast.   WBC 17. blood cx X 2. given IV rocephin/zithromax Going to: [facility] Admission Status:  observation Bed Type: med/tele To Do: f/u on CT chest w/o contrast.  [facility] will assume care on arrival to accepting facility. Until arrival, medical decision making responsibilities remain with the EDP.  However, [facility] available 24/7 for questions and assistance.   Nursing staff please page [place] and Consults [phone]) as soon as the patient arrives to the hospital.  Kristopher Oppenheim, DO [facility]

## 2022-01-15 NOTE — Assessment & Plan Note (Signed)
Pt started on heparin gtt empirically in ED as PE couldn't be ruled out. CT non-contrast of chest showed: multifocal PNA. Also showed question of PAH.  Pt didn't have [place] findings on 2d echo just last month (12/15), so if PAH present this would be quite new. PNA treatment as below PNA likely driving most if not all of his new O2 requirement, shortness of breath etc. However, can't exclude superimposed PE: H/o DVT 3x in the past, not on chronic AC despite this Saw cards last month with LE swelling complaints Question of PAH on CT scan today, but no PAH mention on [place] just last month. Therefore will leave him on heparin gtt for the moment Repeat 2d echo ordered to look for [facility] / [facility] evidence BLE [place] to r/o DVT Will defer to day team if they want to stop heparin gtt.

## 2022-01-15 NOTE — H&P (Signed)
History and Physical    Patient: David Mendoza YBO:[id number] DOB: Aug 25, 1940 DOA: 01/14/2022 DOS: the patient was seen and examined on 01/15/2022 PCP: Kathalene Frames, MD  Patient coming from: Home  Chief Complaint:  Chief Complaint  Patient presents with   Shortness of Breath   HPI: David JEZEWSKI is a 82 y.o. 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  Pt in to ED at med center with cough, cold symptoms for past 1 week.  Progressively worsening SOB in that time.  Some increase in BLE edema over past few days.  No abd pain, no fever.   Review of Systems: As mentioned in the history of present illness. All other systems reviewed and are negative. Past Medical History:  Diagnosis Date   Chronic anticoagulation    Diabetic retinopathy ([place])    DVT (deep venous thrombosis) (HCC)    x2   Hyperlipemia    Hypertension    Multiple falls    Nuclear sclerotic cataract of left eye 12/25/2019   Nuclear sclerotic cataract of right eye 12/25/2019   Peripheral neuropathy    Posterior vitreous detachment of left eye 12/25/2019   Sciatica    Past Surgical History:  Procedure Laterality Date   CRANIOTOMY Bilateral 04/07/2014   Procedure: BILATERAL CRANIOTOMY HEMATOMA EVACUATION SUBDURAL;  Surgeon: Karie Chimera, MD;  Location: [facility];  Service: Neurosurgery;  Laterality: Bilateral;   PILONIDAL CYST EXCISION     Social History:  reports that he has quit smoking. His smoking use included cigarettes. He has never used smokeless tobacco. He reports current alcohol use. He reports that he does not use drugs.  Allergies  Allergen Reactions   Sulfa Antibiotics Diarrhea    History reviewed. No pertinent family history.  Prior to Admission medications   Medication Sig Start Date End Date Taking? Authorizing Provider  amLODipine (NORVASC) 5 MG tablet Take 5 mg by mouth daily.    [provider]  carvedilol (COREG) 25 MG tablet Take 25 mg by mouth 2 (two) times daily. 05/13/21   [provider]  COVID-19 mRNA bivalent vaccine, Pfizer, (PFIZER COVID-19 VAC BIVALENT) injection Inject into the muscle. 12/22/20   Carlyle Basques, MD  Cyanocobalamin (B-12) 1000 MCG TBCR Take 1 tablet by mouth daily. 07/28/21   [provider]  ferrous gluconate (FERGON) 324 MG tablet Take 324 mg by mouth every other day.    [provider]  folic acid (FOLVITE) 277 MCG tablet Take 800 mcg by mouth daily. 07/28/21   [provider]  furosemide (LASIX) 20 MG tablet Take 20 mg by mouth every other day. 11/15/21   [provider]  glipiZIDE (GLUCOTROL XL) 2.5 MG 24 hr tablet Take 2.5 mg by mouth in the morning and at bedtime. 11/11/21   [provider]  losartan (COZAAR) 100 MG tablet Take 100 mg by mouth daily.    [provider]  metFORMIN (GLUCOPHAGE) 500 MG tablet Take 500 mg by mouth 2 (two) times daily with a meal.    [provider]  omeprazole (PRILOSEC) 40 MG capsule Take 40 mg by mouth daily.    [provider]  sertraline (ZOLOFT) 50 MG tablet Take 75 mg by mouth every morning. 05/13/21   [provider]  simvastatin (ZOCOR) 40 MG tablet Take 40 mg by mouth every evening. 08/12/21   [provider]  Physical Exam: Vitals:   01/15/22 0115 01/15/22 0116 01/15/22 0130 01/15/22 0216  BP: (!) 167/84   (!) 168/78  Pulse:  83 83 82  Resp: (!) 24 (!) 32 (!) 26 (!) 22  Temp:    97.9 F (36.6 C)  TempSrc:    Oral  SpO2:  95% 99% 97%  Weight:    89.4 kg  Height:       Constitutional: NAD, calm, comfortable Eyes: PERRL, lids and conjunctivae normal ENMT: Mucous membranes are moist. Posterior pharynx clear of any exudate or lesions.Normal dentition.  Neck: normal, supple, no masses, no thyromegaly Respiratory: clear to auscultation bilaterally, no wheezing, no crackles. Normal respiratory effort. No  accessory muscle use.  Cardiovascular: Regular rate and rhythm, no murmurs / rubs / gallops. 1-2+ BLE edema. 2+ pedal pulses. No carotid bruits.  Abdomen: no tenderness, no masses palpated. No hepatosplenomegaly. Bowel sounds positive.  Musculoskeletal: no clubbing / cyanosis. No joint deformity upper and lower extremities. Good ROM, no contractures. Normal muscle tone.  Skin: no rashes, lesions, ulcers. No induration Neurologic: CN 2-12 grossly intact. Sensation intact, DTR normal. Strength 5/5 in all 4.  Psychiatric: Normal judgment and insight. Alert and oriented x 3. Normal mood.   Data Reviewed:       Latest Ref Rng & Units 01/14/2022   10:10 PM 11/04/2021    8:50 AM 04/07/2014    3:24 PM  CBC  WBC 4.0 - 10.5 K/uL 17.0  7.9  10.7   Hemoglobin 13.0 - 17.0 g/dL 11.0  10.3  12.2   Hematocrit 39.0 - 52.0 % 33.5  31.6  35.8   Platelets 150 - 400 K/uL 270  284  290       Latest Ref Rng & Units 01/14/2022   10:10 PM 12/23/2021   10:49 AM 11/04/2021    8:50 AM  CMP  Glucose 70 - 99 mg/dL 231  152  152   BUN 8 - 23 mg/dL '29  26  24   '$ Creatinine 0.61 - 1.24 mg/dL 2.23  1.90  1.83   Sodium 135 - 145 mmol/L 142  141  143   Potassium 3.5 - 5.1 mmol/L 4.0  4.1  3.9   Chloride 98 - 111 mmol/L 106  105  109   CO2 22 - 32 mmol/L '24  24  26   '$ Calcium 8.9 - 10.3 mg/dL 8.8  8.6  8.5    Trop 12 -> 13  BNP 630  D.Dimer 2.0  COVID, FLU, RSV all negative.  CT chest non-contrast: IMPRESSION: 1. Mucosal thickening bilaterally with multifocal mucous plugging, scattered airspace opacities bilaterally with consolidation in the right upper and lower lobes and left lower lobe, compatible with pneumonia. 2. Small right pleural effusion and trace left pleural effusion. 3. Small to moderate pericardial effusion. 4. Mediastinal lymphadenopathy, likely reactive. 5. Aortic atherosclerosis with mild aneurysmal dilatation of the ascending aorta measuring 4 cm. Recommend annual imaging followup  by CTA or MRA. This recommendation follows 2010 ACCF/AHA/AATS/ACR/ASA/SCA/SCAI/SIR/STS/SVM Guidelines for the Diagnosis and Management of Patients with Thoracic Aortic Disease. Circulation. 2010; 121: W263-Z858. Aortic aneurysm NOS (ICD10-I71.9) 6. Distended pulmonary trunk suggesting underlying pulmonary artery hypertension. 7. Anasarca.    Assessment and Plan: * Acute respiratory failure with hypoxia (HCC) Pt started on heparin gtt empirically in ED as PE couldn't be ruled out. CT non-contrast of chest showed: multifocal PNA. Also showed question of PAH.  Pt didn't have [place] findings on 2d echo just last month (12/15), so  if [place] present this would be quite new. PNA treatment as below PNA likely driving most if not all of his new O2 requirement, shortness of breath etc. However, can't exclude superimposed PE: H/o DVT 3x in the past, not on chronic AC despite this Saw cards last month with LE swelling complaints Question of PAH on CT scan today, but no PAH mention on [place] just last month. Therefore will leave him on heparin gtt for the moment Repeat 2d echo ordered to look for [facility] / [facility] evidence BLE [place] to r/o DVT Will defer to day team if they want to stop heparin gtt.  Multifocal pneumonia Covid, flu, RSV neg WBC elevated PNA pathway Check RVP Empiric rocephin + azithromycin Check urine for s.pneumo  Stage 3b chronic kidney disease (CKD) ([place]) Looks like he has CKD 3b - 4 at baseline. Creat 2.2 today maybe slightly worse today than it was in Dec (1.9) and oct (1.85).  Looks like due to rising creat they tried reducing lasix from '20mg'$  daily to every other day when cards saw him in Dec. Monitor daily BMP Med rec pending, hold anything nephrotoxic (ie ARB and diuretics) for the moment. Though with that said, may need to resume diuretics as he DOES have anasarca on CT today and BLE peripheral edema.  BNP 630 also more elevated than 301 in Oct  HTN (hypertension) Med rec pending  at this time Hold anything potentially nephrotoxic (ARB or ACE) Cont other home BP meds as BP running on the high side in ED and here in hospital.  DM2 (diabetes mellitus, type 2) ([place]) Med rec pending - no longer takes metformin he says. Will put patient on mod scale SSI AC/HS for the moment.      Advance Care Planning:   Code Status: Full Code Confirmed directly with patient.  Also he has a MOST form to this effect.  Consults: None  Family Communication: No family in room  Severity of Illness: The appropriate patient status for this patient is OBSERVATION. Observation status is judged to be reasonable and necessary in order to provide the required intensity of service to ensure the patient's safety. The patient's presenting symptoms, physical exam findings, and initial radiographic and laboratory data in the context of their medical condition is felt to place them at decreased risk for further clinical deterioration. Furthermore, it is anticipated that the patient will be medically stable for discharge from the hospital within 2 midnights of admission.   Author: Etta Quill., DO 01/15/2022 5:23 AM  For on call review [email or web address].

## 2022-01-15 NOTE — ED Notes (Signed)
2nd blood cultures drawn from right wrist area times one stick.  70m were collected in both blood culture bottles. Tolerated well.

## 2022-01-15 NOTE — Assessment & Plan Note (Signed)
Med rec pending at this time Hold anything potentially nephrotoxic (ARB or ACE) Cont other home BP meds as BP running on the high side in ED and here in hospital.

## 2022-01-15 NOTE — ED Notes (Signed)
Blood culture(blue bottle) only drawn from left ac

## 2022-01-15 NOTE — Assessment & Plan Note (Signed)
Med rec pending - no longer takes metformin he says. Will put patient on mod scale SSI AC/HS for the moment.

## 2022-01-15 NOTE — Progress Notes (Addendum)
ANTICOAGULATION CONSULT NOTE - Follow Up Consult  Pharmacy Consult for Heparin Indication: rule out PE  Allergies  Allergen Reactions   Sulfa Antibiotics Diarrhea    Patient Measurements: Height: '5\' 10"'$  (177.8 cm) Weight: 89.4 kg (197 lb) IBW/kg (Calculated) : 73 Heparin Dosing Weight: TBW  Vital Signs: Temp: 98.1 F (36.7 C) (01/07 0642) Temp Source: Oral (01/07 0642) BP: 156/74 (01/07 0539) Pulse Rate: 84 (01/07 0648)  Labs: Recent Labs    01/14/22 2210 01/15/22 0008 01/15/22 0531  HGB 11.0*  --  10.8*  HCT 33.5*  --  33.2*  PLT 270  --  270  LABPROT 13.8  --   --   INR 1.1  --   --   CREATININE 2.23*  --  2.31*  TROPONINIHS 12 13  --     Estimated Creatinine Clearance: 28.2 mL/min (A) (by C-G formula based on SCr of 2.31 mg/dL (H)).   Medications:  Infusions:   azithromycin     cefTRIAXone (ROCEPHIN)  IV     heparin 1,300 Units/hr (01/15/22 0127)    Assessment: 41 yoM presented to ED on 1/6 with SOB requiring oxygen, cold symptoms x1 week, increased LE edema.  Pharmacy was consulted to dose Heparin IV for r/o PE due to history of DVT, elevated D-dimer, unable to get CTa with AKI and VQ Scan is pending.    Today, 01/15/2022:   Heparin level 0.42, therapeutic on heparin 1300 units/hr CBC:  Hgb down to 10.8, Plt WNL No bleeding or complications reported  Goal of Therapy:  Heparin level 0.3-0.7 units/ml Monitor platelets by anticoagulation protocol: Yes   Plan:  Continue heparin IV infusion at 1300 units/hr Confirmatory Heparin level in 8 hours Daily heparin level and CBC F/u VTE imaging   Gretta Arab PharmD, BCPS [facility] main pharmacy [phone] 01/15/2022 9:22 AM   Addendum: Confirmatory heparin level 0.43, therapeutic on heparin 1300 units/hr No bleeding, complications or infusion problems noted.  Gretta Arab PharmD, BCPS [facility] main pharmacy [phone] 01/15/2022 5:56 PM

## 2022-01-15 NOTE — Progress Notes (Addendum)
PROGRESS NOTE    David Mendoza  CWC:[id number] DOB: 07/04/1940 DOA: 01/14/2022 PCP: Kathalene Frames, MD   Brief Narrative:  David Mendoza is a 82 y.o. 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   Pt in to ED at med center with cough, cold symptoms for past 1 week.  Progressively worsening SOB in that time.  Some increase in BLE edema over past few days.  No abd pain, no fever.  Upon arrival to ED: Patient tachypneic, pulse: 83, BP: 168/78, requiring 5 to 6 L of oxygen via nasal cannula.  Afebrile, leukocytosis of 17,000, H&H 11.0/33.5, platelet: 270.  Glucose 231, BUN: 29, creatinine: 2.23, sodium 142, potassium: 4.0 troponin negative, BNP: 630, D-dimer 2.0.  COVID, flu, RSV all negative.  CT chest without contrast shows mucosal thickening bilaterally with multifocal mucous plugging scattered airspace opacities bilaterally with consolidation in right upper and lower lobe and left lower lobe compatible with pneumonia.  Small right pleural effusion and trace left pleural effusion.  Small to moderate pericardial effusion.  Mediastinal lymphadenopathy likely reactive.  Aortic atherosclerosis and mild aneurysm dilatation of ascending aorta measuring 4 cm.  Recommend annual imaging.  Distended pulmonary trunk Sting underlying pulmonary artery hypertension.  Anasarca.  Patient started on IV heparin gtt. as PE could not ruled out.  Started on broad-spectrum antibiotics and admitted for further evaluation management.  Assessment & Plan:   Acute hypoxemic respiratory failure likely in the setting of multifocal pneumonia: -?  PE -Currently on 3 L of oxygen via nasal cannula -Reviewed CT chest without contrast.  Strep pneumo negative.  Respiratory panel: Negative.  He is afebrile with leukocytosis of 15,000.  Lactic acid: Within normal limits -Patient started on Rocephin and azithromycin in ED along with heparin  GTT for the concern of PE.  Has history of DVT x 3 in the past.  He is not on anticoagulation at home. -Repeat 2D echo is pending.  Doppler ultrasound of bilateral lower extremity pending. VQ scan would likely be abnormal due to pneumonia. -Will continue current antibiotics and heparin GTT at this time until Doppler ultrasound  results are back -Will try to wean off of oxygen as tolerated  Bilateral lower extremity edema: -Doppler ultrasound pending  CKD stage IIIb: -At baseline.  Continue to monitor  Hypertension: BP elevated this a.m. -Resume home amlodipine, losartan  Hyperlipidemia: Continue simvastatin  GERD: Continue PPI  Depression/anxiety: Continue Zoloft  Grade 1 diastolic dysfunction: PAH: Noted on CT -Noted on echo from 12/23.  Continue Lasix.  Strict INO's and daily weight. -Repeat limited echo is pending  Aortic atherosclerosis Ascending aorta aneurysm: Outpatient follow-up recommended  Type 2 diabetes: Check A1c-pending -On sliding scale insulin.  On metformin and glipizide at home-hold now  DVT prophylaxis: Heparin GTT Code Status: Full code Family Communication:  None present at bedside.  Plan of care discussed with patient in length and he verbalized understanding and agreed with it. Disposition Plan: To be determined  Consultants:  None  Procedures:  None  Antimicrobials:  Rocephin Azithromycin  Status is: Observation   Subjective: Patient seen and examined.  Reports that he feels little better.  Still short of breath with mild exertion.  No chest pain.  Has bilateral lower extremity swelling since 1 week.  Followed by cardiology outpatient.  No fever, chills.  No recent travel.  Remained afebrile.  No acute events overnight.  Objective: Vitals:   01/15/22 0216 01/15/22 0642 01/15/22 0648 01/15/22 1017  BP: (!) 168/78 (!) 156/74  (!) 174/82  Pulse: 82 83 84 91  Resp: (!) '22 18  19  '$ Temp: 97.9 F (36.6 C) 98.1 F (36.7 C)  98.5 F (36.9 C)   TempSrc: Oral Oral  Oral  SpO2: 97% 98% 98% 92%  Weight: 89.4 kg     Height:        Intake/Output Summary (Last 24 hours) at 01/15/2022 1204 Last data filed at 01/15/2022 0750 Gross per 24 hour  Intake 822.91 ml  Output 200 ml  Net 622.91 ml   Filed Weights   01/14/22 2211 01/15/22 0216  Weight: 84.4 kg 89.4 kg    Examination:  General exam: Appears calm and comfortable, on nasal cannula, communicating well Respiratory system: Clear to auscultation. Respiratory effort normal.  Increased work of breathing noted Cardiovascular system: S1 & S2 heard, RRR. No JVD, murmurs, rubs, gallops or clicks.  Bilateral 2+ pitting edema positive Gastrointestinal system: Abdomen is nondistended, soft and nontender. No organomegaly or masses felt. Normal bowel sounds heard. Central nervous system: Alert and oriented. No focal neurological deficits. Extremities: Symmetric 5 x 5 power. Skin: No rashes, lesions or ulcers Psychiatry: Judgement and insight appear normal. Mood & affect appropriate.    Data Reviewed: I have personally reviewed following labs and imaging studies  CBC: Recent Labs  Lab 01/14/22 2210 01/15/22 0531  WBC 17.0* 15.1*  HGB 11.0* 10.8*  HCT 33.5* 33.2*  MCV 94.4 95.7  PLT 270 595   Basic Metabolic Panel: Recent Labs  Lab 01/14/22 2210 01/15/22 0531  NA 142 140  K 4.0 4.0  CL 106 110  CO2 24 18*  GLUCOSE 231* 261*  BUN 29* 31*  CREATININE 2.23* 2.31*  CALCIUM 8.8* 8.3*   GFR: Estimated Creatinine Clearance: 28.2 mL/min (A) (by C-G formula based on SCr of 2.31 mg/dL (H)). Liver Function Tests: Recent Labs  Lab 01/15/22 0531  AST 18  ALT 20  ALKPHOS 113  BILITOT 0.9  PROT 7.4  ALBUMIN 2.5*   No results for input(s): "LIPASE", "AMYLASE" in the last 168 hours. No results for input(s): "AMMONIA" in the last 168 hours. Coagulation Profile: Recent Labs  Lab 01/14/22 2210  INR 1.1   Cardiac Enzymes: No results for input(s): "CKTOTAL", "CKMB",  "CKMBINDEX", "TROPONINI" in the last 168 hours. BNP (last 3 results) No results for input(s): "PROBNP" in the last 8760 hours. HbA1C: No results for input(s): "HGBA1C" in the last 72 hours. CBG: Recent Labs  Lab 01/15/22 0739 01/15/22 1201  GLUCAP 240* 334*   Lipid Profile: No results for input(s): "CHOL", "HDL", "LDLCALC", "TRIG", "CHOLHDL", "LDLDIRECT" in the last 72 hours. Thyroid Function Tests: No results for input(s): "TSH", "T4TOTAL", "FREET4", "T3FREE", "THYROIDAB" in the last 72 hours. Anemia Panel: No results for input(s): "VITAMINB12", "FOLATE", "FERRITIN", "TIBC", "IRON", "RETICCTPCT" in the last 72 hours. Sepsis Labs: Recent Labs  Lab 01/14/22 2210 01/15/22 0008  LATICACIDVEN 0.8 0.7    Recent Results (from the past 240 hour(s))  Resp panel by RT-PCR (RSV, Flu A&B, Covid) Anterior Nasal Swab     Status: None   Collection Time: 01/14/22 10:10 PM   Specimen: Anterior Nasal Swab  Result Value Ref Range Status   SARS Coronavirus 2 by RT PCR NEGATIVE NEGATIVE Final    Comment: (NOTE) SARS-CoV-2 target nucleic acids are NOT DETECTED.  The SARS-CoV-2 RNA is generally detectable in upper respiratory specimens during the acute phase  of infection. The lowest concentration of SARS-CoV-2 viral copies this assay can detect is 138 copies/mL. A negative result does not preclude SARS-Cov-2 infection and should not be used as the sole basis for treatment or other patient management decisions. A negative result may occur with  improper specimen collection/handling, submission of specimen other than nasopharyngeal swab, presence of viral mutation(s) within the areas targeted by this assay, and inadequate number of viral copies(<138 copies/mL). A negative result must be combined with clinical observations, patient history, and epidemiological information. The expected result is Negative.  Fact Sheet for Patients:  EntrepreneurPulse.com.au  Fact Sheet for  Healthcare Providers:  IncredibleEmployment.be  This test is no t yet approved or cleared by the [place] FDA and  has been authorized for detection and/or diagnosis of SARS-CoV-2 by FDA under an Emergency Use Authorization (EUA). This EUA will remain  in effect (meaning this test can be used) for the duration of the COVID-19 declaration under Section 564(b)(1) of the Act, 21 U.S.C.section 360bbb-3(b)(1), unless the authorization is terminated  or revoked sooner.       Influenza A by PCR NEGATIVE NEGATIVE Final   Influenza B by PCR NEGATIVE NEGATIVE Final    Comment: (NOTE) The Xpert Xpress SARS-CoV-2/FLU/RSV plus assay is intended as an aid in the diagnosis of influenza from Nasopharyngeal swab specimens and should not be used as a sole basis for treatment. Nasal washings and aspirates are unacceptable for Xpert Xpress SARS-CoV-2/FLU/RSV testing.  Fact Sheet for Patients: EntrepreneurPulse.com.au  Fact Sheet for Healthcare Providers: IncredibleEmployment.be  This test is not yet approved or cleared by the [place] FDA and has been authorized for detection and/or diagnosis of SARS-CoV-2 by FDA under an Emergency Use Authorization (EUA). This EUA will remain in effect (meaning this test can be used) for the duration of the COVID-19 declaration under Section 564(b)(1) of the Act, 21 U.S.C. section 360bbb-3(b)(1), unless the authorization is terminated or revoked.     Resp Syncytial Virus by PCR NEGATIVE NEGATIVE Final    Comment: (NOTE) Fact Sheet for Patients: EntrepreneurPulse.com.au  Fact Sheet for Healthcare Providers: IncredibleEmployment.be  This test is not yet approved or cleared by the [place] FDA and has been authorized for detection and/or diagnosis of SARS-CoV-2 by FDA under an Emergency Use Authorization (EUA). This EUA will remain in effect (meaning this  test can be used) for the duration of the COVID-19 declaration under Section 564(b)(1) of the Act, 21 U.S.C. section 360bbb-3(b)(1), unless the authorization is terminated or revoked.  Performed at [facility], [address], [place], [place] [address]       Radiology Studies: CT Chest Wo Contrast  Result Date: 01/15/2022 CLINICAL DATA:  Respiratory illness, nondiagnostic x-ray. Cough and cold like symptoms. Shortness of breath today. EXAM: CT CHEST WITHOUT CONTRAST TECHNIQUE: Multidetector CT imaging of the chest was performed following the standard protocol without IV contrast. RADIATION DOSE REDUCTION: This exam was performed according to the departmental dose-optimization program which includes automated exposure control, adjustment of the mA and/or kV according to patient size and/or use of iterative reconstruction technique. COMPARISON:  11/04/2021. FINDINGS: Cardiovascular: The heart is normal in size and there is a small to moderate pericardial effusion. There is atherosclerotic calcification of the aorta with mild aneurysmal dilatation of the ascending aorta measuring 4 cm. The pulmonary trunk is distended suggesting underlying pulmonary artery hypertension. Mediastinum/Nodes: Multiple enlarged lymph nodes are present in the mediastinum measuring up to 1.5 cm in the right paratracheal space a 1.6  cm in the subcarinal space. No axillary lymphadenopathy. Evaluation of the hila is limited due to lack of IV contrast. The thyroid gland and esophagus are within normal limits. Debris is noted in the mid trachea. Lungs/Pleura: There is a small right pleural effusion and trace left pleural effusion. Bronchial wall thickening is noted bilaterally with areas of mucous plugging. Scattered airspace opacities are noted in the lungs bilaterally. There is consolidation in the right upper and lower lobes and left lower lobe. No pneumothorax. Upper Abdomen: There is a cyst in the right  upper quadrant, possible right renal cyst. No acute abnormality. Musculoskeletal: Anasarca is present. Old rib fractures on the right. Degenerative changes in the thoracic spine. No acute osseous abnormality. IMPRESSION: 1. Mucosal thickening bilaterally with multifocal mucous plugging, scattered airspace opacities bilaterally with consolidation in the right upper and lower lobes and left lower lobe, compatible with pneumonia. 2. Small right pleural effusion and trace left pleural effusion. 3. Small to moderate pericardial effusion. 4. Mediastinal lymphadenopathy, likely reactive. 5. Aortic atherosclerosis with mild aneurysmal dilatation of the ascending aorta measuring 4 cm. Recommend annual imaging followup by CTA or MRA. This recommendation follows 2010 ACCF/AHA/AATS/ACR/ASA/SCA/SCAI/SIR/STS/SVM Guidelines for the Diagnosis and Management of Patients with Thoracic Aortic Disease. Circulation. 2010; 121: I097-D532. Aortic aneurysm NOS (ICD10-I71.9) 6. Distended pulmonary trunk suggesting underlying pulmonary artery hypertension. 7. Anasarca. Electronically Signed   By: Brett Fairy M.D.   On: 01/15/2022 01:24   DG Chest Port 1 View  Result Date: 01/14/2022 CLINICAL DATA:  Chest pain. EXAM: PORTABLE CHEST 1 VIEW COMPARISON:  Chest radiograph dated November 28, 2021. FINDINGS: The heart is enlarged. Mild pulmonary vascular congestion. Bibasilar opacities, right greater than the left suggesting small effusions and/or atelectasis. Thoracic spondylosis. IMPRESSION: 1. Cardiomegaly with mild pulmonary vascular congestion. 2. Bibasilar opacities, right greater than left suggesting small effusions and/or atelectasis. Electronically Signed   By: Keane Police D.O.   On: 01/14/2022 23:49    Scheduled Meds:  insulin aspart  0-15 Units Subcutaneous TID WC   insulin aspart  0-5 Units Subcutaneous QHS   Continuous Infusions:  azithromycin     cefTRIAXone (ROCEPHIN)  IV     heparin 1,300 Units/hr (01/15/22 0127)      LOS: 0 days   Time spent: 35 minutes   Hayven Fatima Loann Quill, MD [facility]  If 7PM-7AM, please contact night-coverage [email or web address] 01/15/2022, 12:04 PM

## 2022-01-15 NOTE — ED Notes (Signed)
Pt's wife called and updated that he will be going to [facility] and given pt's room number.

## 2022-01-15 NOTE — Assessment & Plan Note (Addendum)
Looks like he has CKD 3b - 4 at baseline. Creat 2.2 today maybe slightly worse today than it was in Dec (1.9) and oct (1.85).  Looks like due to rising creat they tried reducing lasix from '20mg'$  daily to every other day when cards saw him in Dec. Monitor daily BMP Med rec pending, hold anything nephrotoxic (ie ARB and diuretics) for the moment. Though with that said, may need to resume diuretics as he DOES have anasarca on CT today and BLE peripheral edema.  BNP 630 also more elevated than 301 in Oct

## 2022-01-15 NOTE — Progress Notes (Signed)
ANTICOAGULATION CONSULT NOTE - Initial Consult  Pharmacy Consult for heparin Indication:  r/o VTE  Allergies  Allergen Reactions   Sulfa Antibiotics Diarrhea    Patient Measurements: Height: '5\' 10"'$  (177.8 cm) Weight: 84.4 kg (186 lb) IBW/kg (Calculated) : 73  Vital Signs: Temp: 98 F (36.7 C) (01/06 2211) Temp Source: Oral (01/06 2208) BP: 194/88 (01/07 0050) Pulse Rate: 81 (01/07 0050)  Labs: Recent Labs    01/14/22 2210 01/15/22 0008  HGB 11.0*  --   HCT 33.5*  --   PLT 270  --   LABPROT 13.8  --   INR 1.1  --   CREATININE 2.23*  --   TROPONINIHS 12 13    Estimated Creatinine Clearance: 26.8 mL/min (A) (by C-G formula based on SCr of 2.23 mg/dL (H)).   Medical History: Past Medical History:  Diagnosis Date   Chronic anticoagulation    Diabetic retinopathy ([place])    DVT (deep venous thrombosis) ([facility])    x2   Hyperlipemia    Hypertension    Multiple falls    Nuclear sclerotic cataract of left eye 12/25/2019   Nuclear sclerotic cataract of right eye 12/25/2019   Peripheral neuropathy    Posterior vitreous detachment of left eye 12/25/2019   Sciatica     Assessment: 82yo male c/o cold sx x1wk, found by EMS w/ O2 sats at 80% RA that improved w/ 4L [place], during w/u D-dimer was found to be elevated w/ known h/o DVTs not currently on [facility], awaiting VQ scan, to begin heparin.  Goal of Therapy:  Heparin level 0.3-0.7 units/ml Monitor platelets by anticoagulation protocol: Yes   Plan:  Heparin 3000 units IV bolus x1 followed by infusion at 1300 units/hr. Monitor heparin levels and CBC.  Wynona Neat, PharmD, BCPS  01/15/2022,12:58 AM

## 2022-01-16 ENCOUNTER — Inpatient Hospital Stay (HOSPITAL_COMMUNITY): Payer: Medicare Other

## 2022-01-16 DIAGNOSIS — J189 Pneumonia, unspecified organism: Secondary | ICD-10-CM | POA: Diagnosis not present

## 2022-01-16 DIAGNOSIS — N1832 Chronic kidney disease, stage 3b: Secondary | ICD-10-CM | POA: Diagnosis not present

## 2022-01-16 DIAGNOSIS — M7989 Other specified soft tissue disorders: Secondary | ICD-10-CM

## 2022-01-16 DIAGNOSIS — J9601 Acute respiratory failure with hypoxia: Secondary | ICD-10-CM | POA: Diagnosis not present

## 2022-01-16 DIAGNOSIS — E1122 Type 2 diabetes mellitus with diabetic chronic kidney disease: Secondary | ICD-10-CM | POA: Diagnosis not present

## 2022-01-16 LAB — GLUCOSE, CAPILLARY
Glucose-Capillary: 194 mg/dL — ABNORMAL HIGH (ref 70–99)
Glucose-Capillary: 172 mg/dL — ABNORMAL HIGH (ref 70–99)
Glucose-Capillary: 116 mg/dL — ABNORMAL HIGH (ref 70–99)
Glucose-Capillary: 166 mg/dL — ABNORMAL HIGH (ref 70–99)

## 2022-01-16 LAB — BASIC METABOLIC PANEL
Anion gap: 10 (ref 5–15)
BUN: 44 mg/dL — ABNORMAL HIGH (ref 8–23)
CO2: 21 mmol/L — ABNORMAL LOW (ref 22–32)
Calcium: 8 mg/dL — ABNORMAL LOW (ref 8.9–10.3)
Chloride: 107 mmol/L (ref 98–111)
Creatinine, Ser: 2.57 mg/dL — ABNORMAL HIGH (ref 0.61–1.24)
GFR, Estimated: 24 mL/min — ABNORMAL LOW (ref >60)
Glucose, Bld: 219 mg/dL — ABNORMAL HIGH (ref 70–99)
Potassium: 3.6 mmol/L (ref 3.5–5.1)
Sodium: 138 mmol/L (ref 135–145)

## 2022-01-16 LAB — CBC
HCT: 29.3 % — ABNORMAL LOW (ref 39.0–52.0)
Hemoglobin: 9.5 g/dL — ABNORMAL LOW (ref 13.0–17.0)
MCH: 30.7 pg (ref 26.0–34.0)
MCHC: 32.4 g/dL (ref 30.0–36.0)
MCV: 94.8 fL (ref 80.0–100.0)
Platelets: 283 10*3/uL (ref 150–400)
RBC: 3.09 MIL/uL — ABNORMAL LOW (ref 4.22–5.81)
RDW: 13.4 % (ref 11.5–15.5)
WBC: 14.9 10*3/uL — ABNORMAL HIGH (ref 4.0–10.5)
nRBC: 0 % (ref 0.0–0.2)

## 2022-01-16 LAB — HEMOGLOBIN A1C
Hgb A1c MFr Bld: 8.5 % — ABNORMAL HIGH (ref 4.8–5.6)
Mean Plasma Glucose: 197 mg/dL

## 2022-01-16 LAB — MAGNESIUM: Magnesium: 1.8 mg/dL (ref 1.7–2.4)

## 2022-01-16 LAB — HEPARIN LEVEL (UNFRACTIONATED): Heparin Unfractionated: 0.36 IU/mL (ref 0.30–0.70)

## 2022-01-16 MED ORDER — HEPARIN SODIUM (PORCINE) 5000 UNIT/ML IJ SOLN
5000.0000 [IU] | Freq: Three times a day (TID) | INTRAMUSCULAR | Status: DC
  Administered 2022-01-16 – 2022-01-17 (×4): 5000 [IU] via SUBCUTANEOUS
  Filled 2022-01-16 (×4): qty 1

## 2022-01-16 NOTE — Progress Notes (Signed)
PROGRESS NOTE    David Mendoza  DUK:[id number] DOB: 08/14/1940 DOA: 01/14/2022 PCP: Kathalene Frames, MD   Brief Narrative:  David Mendoza is a 82 y.o. male with past medical history of DVT x 3 in the past no longer on anticoagulation, history of craniotomy for traumatic SDH in 2016, diabetes mellitus type 2, hypertension, hyperlipidemia presented to hospital with cough and cold for 1 week with progressive shortness of breath and increasing lower extremity edema.  In the ED, patient was tachypneic, blood pressure was elevated, he was on 5 to 6 L of oxygen by nasal cannula.  Labs showed leukocytosis of 17 K.  Creatinine was elevated at 2.2.  BNP 630.  D-dimer was 2.0.  COVID flu and RSV was negative.   CT chest without contrast shows mucosal thickening bilaterally with multifocal mucous plugging scattered airspace opacities bilaterally with consolidation in right upper and lower lobe and left lower lobe compatible with pneumonia with small bilateral pleural effusion and pericardial effusion with mediastinal lymphadenopathy. Patient was started on IV heparin gtt. as PE could not ruled out.  Started on broad-spectrum antibiotics and admitted for further evaluation management.  Assessment & Plan:   Acute hypoxemic respiratory failure likely in the setting of multifocal pneumonia: Improved at this time.  Currently on room air and was on 2 L of oxygen.  Afebrile.  Leukocytosis has improved to 14.9 from 15 K.  Lactic acid within normal limits.  On Rocephin and Zithromax.  On heparin drip empirically but lower extremity DVT ultrasound is negative.  Will discontinue heparin drip..  2D echocardiogram with LV ejection fraction of 60 to 65% with no regional wall motion abnormality.  Doppler ultrasound of bilateral lower extremity was negative for DVT.  Bilateral lower extremity edema: Duplex ultrasound of the lower extremity negative for DVT.  Mild AKI on CKD stage IIIb: Creatinine today at 2.5.   Continue to monitor with BMP.Marland Kitchen  Hypertension:  On Lasix losartan and Coreg from home.  Will continue to monitor..  Hyperlipidemia: Continue statins.  GERD: Continue PPI  Depression/anxiety: Continue Zoloft  Grade 1 diastolic dysfunction: PAH: Noted on CT. Repeat 2D echocardiogram with LV ejection fraction of 60 to 65%.  Pulmonary hypertension reported.  Aortic atherosclerosis/Ascending aorta aneurysm: Outpatient follow-up recommended  Type 2 diabetes with hyperglycemia:  Continue sliding scale insulin, Accu-Cheks, diabetic diet.  Metformin and glipizide on hold.  Hemoglobin A1c pending.   DVT prophylaxis: Heparin GTT, will discontinue.  Done prophylactic dose.  Code Status: Full code  Family Communication: None at bedside.  Disposition Plan:  Likely home with home health in 1 to 2 days  Consultants:  None  Procedures:  None  Antimicrobials:  Rocephin Azithromycin  Status is: Inpatient   subjective: Today, patient was seen and examined at bedside.  Patient stated that he was able to walk but his oxygen dropped from 92 to 70% on ambulation.  Denies any overt dyspnea or shortness of breath or fever.  Was able to sleep better.   Objective: Vitals:   01/15/22 2113 01/15/22 2142 01/16/22 0459 01/16/22 0515  BP: (!) 151/72  (!) 155/78 (!) 160/75  Pulse: 76 74 70 71  Resp: '20 20 16 20  '$ Temp: 98.5 F (36.9 C)  (!) 97.5 F (36.4 C) 98.1 F (36.7 C)  TempSrc: Oral  Oral   SpO2: 94% 96% 94% 92%  Weight:      Height:        Intake/Output Summary (Last 24 hours) at 01/16/2022  1062 Last data filed at 01/16/2022 0501 Gross per 24 hour  Intake 956.88 ml  Output 250 ml  Net 706.88 ml    Filed Weights   01/14/22 2211 01/15/22 0216  Weight: 84.4 kg 89.4 kg    Physical examination: General:  Average built, not in obvious distress, on room air while at rest.  Elderly male, Communicative HENT:   No scleral pallor or icterus noted. Oral mucosa is moist.  Chest:    Diminished breath sounds bilaterally. No crackles or wheezes.  Coarse breath sounds noted. CVS: S1 &S2 heard. No murmur.  Regular rate and rhythm. Abdomen: Soft, nontender, nondistended.  Bowel sounds are heard.   Extremities: No cyanosis, clubbing or edema.  Peripheral pulses are palpable. Psych: Alert, awake and oriented, normal mood CNS:  No cranial nerve deficits.  Power equal in all extremities.   Skin: Warm and dry.  No rashes noted.   Data Reviewed: I have personally reviewed following labs and imaging studies  CBC: Recent Labs  Lab 01/14/22 2210 01/15/22 0531 01/16/22 0543  WBC 17.0* 15.1* 14.9*  HGB 11.0* 10.8* 9.5*  HCT 33.5* 33.2* 29.3*  MCV 94.4 95.7 94.8  PLT 270 270 694    Basic Metabolic Panel: Recent Labs  Lab 01/14/22 2210 01/15/22 0531 01/16/22 0543  NA 142 140 138  K 4.0 4.0 3.6  CL 106 110 107  CO2 24 18* 21*  GLUCOSE 231* 261* 219*  BUN 29* 31* 44*  CREATININE 2.23* 2.31* 2.57*  CALCIUM 8.8* 8.3* 8.0*  MG  --   --  1.8    GFR: Estimated Creatinine Clearance: 25.4 mL/min (A) (by C-G formula based on SCr of 2.57 mg/dL (H)). Liver Function Tests: Recent Labs  Lab 01/15/22 0531  AST 18  ALT 20  ALKPHOS 113  BILITOT 0.9  PROT 7.4  ALBUMIN 2.5*    No results for input(s): "LIPASE", "AMYLASE" in the last 168 hours. No results for input(s): "AMMONIA" in the last 168 hours. Coagulation Profile: Recent Labs  Lab 01/14/22 2210  INR 1.1    Cardiac Enzymes: No results for input(s): "CKTOTAL", "CKMB", "CKMBINDEX", "TROPONINI" in the last 168 hours. BNP (last 3 results) No results for input(s): "PROBNP" in the last 8760 hours. HbA1C: No results for input(s): "HGBA1C" in the last 72 hours. CBG: Recent Labs  Lab 01/15/22 0739 01/15/22 1201 01/15/22 1550 01/15/22 2111 01/16/22 0728  GLUCAP 240* 334* 275* 331* 194*    Lipid Profile: No results for input(s): "CHOL", "HDL", "LDLCALC", "TRIG", "CHOLHDL", "LDLDIRECT" in the last 72  hours. Thyroid Function Tests: No results for input(s): "TSH", "T4TOTAL", "FREET4", "T3FREE", "THYROIDAB" in the last 72 hours. Anemia Panel: No results for input(s): "VITAMINB12", "FOLATE", "FERRITIN", "TIBC", "IRON", "RETICCTPCT" in the last 72 hours. Sepsis Labs: Recent Labs  Lab 01/14/22 2210 01/15/22 0008  LATICACIDVEN 0.8 0.7     Recent Results (from the past 240 hour(s))  Resp panel by RT-PCR (RSV, Flu A&B, Covid) Anterior Nasal Swab     Status: None   Collection Time: 01/14/22 10:10 PM   Specimen: Anterior Nasal Swab  Result Value Ref Range Status   SARS Coronavirus 2 by RT PCR NEGATIVE NEGATIVE Final    Comment: (NOTE) SARS-CoV-2 target nucleic acids are NOT DETECTED.  The SARS-CoV-2 RNA is generally detectable in upper respiratory specimens during the acute phase of infection. The lowest concentration of SARS-CoV-2 viral copies this assay can detect is 138 copies/mL. A negative result does not preclude SARS-Cov-2 infection and should not  be used as the sole basis for treatment or other patient management decisions. A negative result may occur with  improper specimen collection/handling, submission of specimen other than nasopharyngeal swab, presence of viral mutation(s) within the areas targeted by this assay, and inadequate number of viral copies(<138 copies/mL). A negative result must be combined with clinical observations, patient history, and epidemiological information. The expected result is Negative.  Fact Sheet for Patients:  EntrepreneurPulse.com.au  Fact Sheet for Healthcare Providers:  IncredibleEmployment.be  This test is no t yet approved or cleared by the [place] FDA and  has been authorized for detection and/or diagnosis of SARS-CoV-2 by FDA under an Emergency Use Authorization (EUA). This EUA will remain  in effect (meaning this test can be used) for the duration of the COVID-19 declaration under Section  564(b)(1) of the Act, 21 U.S.C.section 360bbb-3(b)(1), unless the authorization is terminated  or revoked sooner.       Influenza A by PCR NEGATIVE NEGATIVE Final   Influenza B by PCR NEGATIVE NEGATIVE Final    Comment: (NOTE) The Xpert Xpress SARS-CoV-2/FLU/RSV plus assay is intended as an aid in the diagnosis of influenza from Nasopharyngeal swab specimens and should not be used as a sole basis for treatment. Nasal washings and aspirates are unacceptable for Xpert Xpress SARS-CoV-2/FLU/RSV testing.  Fact Sheet for Patients: EntrepreneurPulse.com.au  Fact Sheet for Healthcare Providers: IncredibleEmployment.be  This test is not yet approved or cleared by the [place] FDA and has been authorized for detection and/or diagnosis of SARS-CoV-2 by FDA under an Emergency Use Authorization (EUA). This EUA will remain in effect (meaning this test can be used) for the duration of the COVID-19 declaration under Section 564(b)(1) of the Act, 21 U.S.C. section 360bbb-3(b)(1), unless the authorization is terminated or revoked.     Resp Syncytial Virus by PCR NEGATIVE NEGATIVE Final    Comment: (NOTE) Fact Sheet for Patients: EntrepreneurPulse.com.au  Fact Sheet for Healthcare Providers: IncredibleEmployment.be  This test is not yet approved or cleared by the [place] FDA and has been authorized for detection and/or diagnosis of SARS-CoV-2 by FDA under an Emergency Use Authorization (EUA). This EUA will remain in effect (meaning this test can be used) for the duration of the COVID-19 declaration under Section 564(b)(1) of the Act, 21 U.S.C. section 360bbb-3(b)(1), unless the authorization is terminated or revoked.  Performed at [facility], [address] [place], [place] [address]   Blood culture (routine x 2)     Status: None (Preliminary result)   Collection Time: 01/15/22  12:13 AM   Specimen: BLOOD  Result Value Ref Range Status   Specimen Description   Final    BLOOD BLOOD LEFT ARM Performed at [facility], [address], [place], [place] [address]    Special Requests   Final    BOTTLES DRAWN AEROBIC ONLY Blood Culture adequate volume Performed at [facility], [address], [place], [place] [address]    Culture   Final    NO GROWTH < 24 HOURS Performed at [facility] [address], [place], [place] [address]    Report Status PENDING  Incomplete  Blood culture (routine x 2)     Status: None (Preliminary result)   Collection Time: 01/15/22 12:16 AM   Specimen: BLOOD  Result Value Ref Range Status   Specimen Description   Final    BLOOD BLOOD RIGHT HAND Performed at [facility], [address] [place], [place] [address]  Special Requests   Final    BOTTLES DRAWN AEROBIC AND ANAEROBIC Blood Culture adequate volume Performed at [facility], [address], [place], [place] [address]    Culture   Final    NO GROWTH < 24 HOURS Performed at [facility], [place] [address], [place], [place] [address]    Report Status PENDING  Incomplete  Respiratory (~20 pathogens) panel by PCR     Status: None   Collection Time: 01/15/22  4:25 AM   Specimen: Nasopharyngeal Swab; Respiratory  Result Value Ref Range Status   Adenovirus NOT DETECTED NOT DETECTED Final   Coronavirus 229E NOT DETECTED NOT DETECTED Final    Comment: (NOTE) The Coronavirus on the Respiratory Panel, DOES NOT test for the novel  Coronavirus (2019 nCoV)    Coronavirus HKU1 NOT DETECTED NOT DETECTED Final   Coronavirus NL63 NOT DETECTED NOT DETECTED Final   Coronavirus OC43 NOT DETECTED NOT DETECTED Final   Metapneumovirus NOT DETECTED NOT DETECTED Final   Rhinovirus / Enterovirus NOT DETECTED NOT DETECTED Final   Influenza A NOT DETECTED NOT DETECTED Final   Influenza B NOT DETECTED  NOT DETECTED Final   Parainfluenza Virus 1 NOT DETECTED NOT DETECTED Final   Parainfluenza Virus 2 NOT DETECTED NOT DETECTED Final   Parainfluenza Virus 3 NOT DETECTED NOT DETECTED Final   Parainfluenza Virus 4 NOT DETECTED NOT DETECTED Final   Respiratory Syncytial Virus NOT DETECTED NOT DETECTED Final   Bordetella pertussis NOT DETECTED NOT DETECTED Final   Bordetella Parapertussis NOT DETECTED NOT DETECTED Final   Chlamydophila pneumoniae NOT DETECTED NOT DETECTED Final   Mycoplasma pneumoniae NOT DETECTED NOT DETECTED Final    Comment: Performed at [facility], [place], [place], [place] [address]      Radiology Studies: ECHOCARDIOGRAM LIMITED  Result Date: 01/15/2022    ECHOCARDIOGRAM LIMITED REPORT   Patient Name:   PRAKASH KIMBERLING Purewal Date of Exam: 01/15/2022 Medical Rec #:  [id number]      Height:       70.0 in Accession #:    [id number]     Weight:       197.0 lb Date of Birth:  [birth year]       BSA:          2.074 m Patient Age:    71 years       BP:           156/74 mmHg Patient Gender: M              HR:           88 bpm. Exam Location:  Inpatient Procedure: Limited Echo, Color Doppler and Cardiac Doppler Indications:    Pulmonary embolus/PHTN  History:        Patient has prior history of Echocardiogram examinations, most                 recent 12/23/2021. Signs/Symptoms:Shortness of Breath, Dyspnea                 and Edema; Risk Factors:Hypertension, Diabetes and Dyslipidemia.                 Hx of DVT.  Sonographer:    Eartha Inch Referring Phys: [phone] JARED M GARDNER  Sonographer Comments: Image acquisition challenging due to patient body habitus and Image acquisition challenging due to respiratory motion. IMPRESSIONS  1. Left ventricular ejection fraction, by estimation, is 60 to 65%. The left ventricle has  normal function. The left ventricle has no regional wall motion abnormalities. There is mild left ventricular hypertrophy.  2. Right ventricular systolic function is  normal. The right ventricular size is normal. Tricuspid regurgitation signal is inadequate for assessing PA pressure.  3. Left atrial size was mild to moderately dilated.  4. A small pericardial effusion is present. There is no evidence of cardiac tamponade.  5. The mitral valve is grossly normal. Mild mitral valve regurgitation. No evidence of mitral stenosis.  6. The aortic valve is tricuspid. Aortic valve regurgitation is not visualized. No aortic stenosis is present.  7. The inferior vena cava is normal in size with greater than 50% respiratory variability, suggesting right atrial pressure of 3 mmHg. Comparison(s): No significant change from prior study. Conclusion(s)/Recommendation(s): RV/RA appear normal, no signficant TR to estimate pressures, IVC normal and collapses. FINDINGS  Left Ventricle: Left ventricular ejection fraction, by estimation, is 60 to 65%. The left ventricle has normal function. The left ventricle has no regional wall motion abnormalities. The left ventricular internal cavity size was normal in size. There is  mild left ventricular hypertrophy. Right Ventricle: The right ventricular size is normal. No increase in right ventricular wall thickness. Right ventricular systolic function is normal. Tricuspid regurgitation signal is inadequate for assessing PA pressure. Left Atrium: Left atrial size was mild to moderately dilated. Right Atrium: Right atrial size was normal in size. Pericardium: A small pericardial effusion is present. There is no evidence of cardiac tamponade. Mitral Valve: The mitral valve is grossly normal. Mild mitral valve regurgitation. No evidence of mitral valve stenosis. Tricuspid Valve: The tricuspid valve is not well visualized. Tricuspid valve regurgitation is not demonstrated. No evidence of tricuspid stenosis. Aortic Valve: The aortic valve is tricuspid. Aortic valve regurgitation is not visualized. No aortic stenosis is present. Pulmonic Valve: The pulmonic valve was  not well visualized. Pulmonic valve regurgitation is not visualized. Venous: The inferior vena cava is normal in size with greater than 50% respiratory variability, suggesting right atrial pressure of 3 mmHg. Additional Comments: Spectral Doppler performed. Color Doppler performed.  LEFT VENTRICLE PLAX 2D LVIDd:         4.80 cm LVIDs:         3.20 cm LV PW:         1.10 cm LV IVS:        1.00 cm  LV Volumes (MOD) LV vol d, MOD A2C: 118.0 ml LV vol d, MOD A4C: 141.0 ml LV vol s, MOD A2C: 43.9 ml LV vol s, MOD A4C: 48.8 ml LV SV MOD A2C:     74.1 ml LV SV MOD A4C:     141.0 ml LV SV MOD BP:      83.1 ml IVC IVC diam: 1.60 cm LEFT ATRIUM         Index LA diam:    3.60 cm 1.74 cm/m Buford Dresser MD Electronically signed by Buford Dresser MD Signature Date/Time: 01/15/2022/12:25:41 PM    Final    CT Chest Wo Contrast  Result Date: 01/15/2022 CLINICAL DATA:  Respiratory illness, nondiagnostic x-ray. Cough and cold like symptoms. Shortness of breath today. EXAM: CT CHEST WITHOUT CONTRAST TECHNIQUE: Multidetector CT imaging of the chest was performed following the standard protocol without IV contrast. RADIATION DOSE REDUCTION: This exam was performed according to the departmental dose-optimization program which includes automated exposure control, adjustment of the mA and/or kV according to patient size and/or use of iterative reconstruction technique. COMPARISON:  11/04/2021. FINDINGS: Cardiovascular: The heart  is normal in size and there is a small to moderate pericardial effusion. There is atherosclerotic calcification of the aorta with mild aneurysmal dilatation of the ascending aorta measuring 4 cm. The pulmonary trunk is distended suggesting underlying pulmonary artery hypertension. Mediastinum/Nodes: Multiple enlarged lymph nodes are present in the mediastinum measuring up to 1.5 cm in the right paratracheal space a 1.6 cm in the subcarinal space. No axillary lymphadenopathy. Evaluation of the hila  is limited due to lack of IV contrast. The thyroid gland and esophagus are within normal limits. Debris is noted in the mid trachea. Lungs/Pleura: There is a small right pleural effusion and trace left pleural effusion. Bronchial wall thickening is noted bilaterally with areas of mucous plugging. Scattered airspace opacities are noted in the lungs bilaterally. There is consolidation in the right upper and lower lobes and left lower lobe. No pneumothorax. Upper Abdomen: There is a cyst in the right upper quadrant, possible right renal cyst. No acute abnormality. Musculoskeletal: Anasarca is present. Old rib fractures on the right. Degenerative changes in the thoracic spine. No acute osseous abnormality. IMPRESSION: 1. Mucosal thickening bilaterally with multifocal mucous plugging, scattered airspace opacities bilaterally with consolidation in the right upper and lower lobes and left lower lobe, compatible with pneumonia. 2. Small right pleural effusion and trace left pleural effusion. 3. Small to moderate pericardial effusion. 4. Mediastinal lymphadenopathy, likely reactive. 5. Aortic atherosclerosis with mild aneurysmal dilatation of the ascending aorta measuring 4 cm. Recommend annual imaging followup by CTA or MRA. This recommendation follows 2010 ACCF/AHA/AATS/ACR/ASA/SCA/SCAI/SIR/STS/SVM Guidelines for the Diagnosis and Management of Patients with Thoracic Aortic Disease. Circulation. 2010; 121: Q330-Q762. Aortic aneurysm NOS (ICD10-I71.9) 6. Distended pulmonary trunk suggesting underlying pulmonary artery hypertension. 7. Anasarca. Electronically Signed   By: Brett Fairy M.D.   On: 01/15/2022 01:24   DG Chest Port 1 View  Result Date: 01/14/2022 CLINICAL DATA:  Chest pain. EXAM: PORTABLE CHEST 1 VIEW COMPARISON:  Chest radiograph dated November 28, 2021. FINDINGS: The heart is enlarged. Mild pulmonary vascular congestion. Bibasilar opacities, right greater than the left suggesting small effusions and/or  atelectasis. Thoracic spondylosis. IMPRESSION: 1. Cardiomegaly with mild pulmonary vascular congestion. 2. Bibasilar opacities, right greater than left suggesting small effusions and/or atelectasis. Electronically Signed   By: Keane Police D.O.   On: 01/14/2022 23:49    Scheduled Meds:  amLODipine  5 mg Oral Daily   carvedilol  25 mg Oral BID WC   vitamin B-12  1,000 mcg Oral Daily   ferrous gluconate  324 mg Oral QODAY   folic acid  1 mg Oral Daily   furosemide  20 mg Oral QODAY   insulin aspart  0-15 Units Subcutaneous TID WC   insulin aspart  0-5 Units Subcutaneous QHS   losartan  100 mg Oral Daily   pantoprazole  40 mg Oral Daily   sertraline  75 mg Oral q morning   simvastatin  40 mg Oral QPM   Continuous Infusions:  azithromycin 500 mg (01/15/22 2227)   cefTRIAXone (ROCEPHIN)  IV 2 g (01/15/22 2137)   heparin 1,300 Units/hr (01/15/22 1855)     LOS: 1 day    Flora Lipps, MD [facility]  If 7PM-7AM, please contact night-coverage [email or web address] 01/16/2022, 8:03 AM

## 2022-01-16 NOTE — Progress Notes (Signed)
Mobility Specialist - Progress Note  Pre-mobility: 92%SpO2 During mobility: 78% SpO2 Post-mobility: 90% SPO2   01/16/22 0947  Oxygen Therapy  O2 Device Room Air  Patient Activity (if Appropriate) Ambulating  Mobility  Activity Ambulated with assistance in hallway  Level of Assistance Moderate assist, patient does 50-74%  Assistive Device Front wheel walker  Distance Ambulated (ft) 120 ft  Range of Motion/Exercises Active  Activity Response Tolerated well  Mobility Referral Yes  $Mobility charge 1 Mobility   Pt was found in bed and agreeable to ambulate. Stated feeling unsteady sitting EOB and was mod-A to go from sitting to standing. Took x1 standing rest break due to SPO2 dropping to 78% and after ~ 1 min of pursed lip breathing pt was able to recover SPO2 to 84%. He did state feeling SOB and winded during session. At EOS returned to sit EOB and practiced pursed lip breathing for ~ 3 min for SPO2 to recover to 90%. Was left in bed with necessities in reach and RN notified of session.  Ferd Hibbs Mobility Specialist

## 2022-01-16 NOTE — Progress Notes (Signed)
Bilateral lower extremity venous duplex has been completed. Preliminary results can be found in CV Proc through chart review.   01/16/22 10:05 AM David Mendoza RVT

## 2022-01-16 NOTE — Progress Notes (Signed)
ANTICOAGULATION CONSULT NOTE - Follow Up Consult  Pharmacy Consult for Heparin Indication: rule out PE  Allergies  Allergen Reactions   Sulfa Antibiotics Diarrhea    Patient Measurements: Height: '5\' 10"'$  (177.8 cm) Weight: 89.4 kg (197 lb) IBW/kg (Calculated) : 73 Heparin Dosing Weight: TBW  Vital Signs: Temp: 98.1 F (36.7 C) (01/08 0515) Temp Source: Oral (01/08 0459) BP: 160/75 (01/08 0515) Pulse Rate: 71 (01/08 0515)  Labs: Recent Labs    01/14/22 2210 01/15/22 0008 01/15/22 0531 01/15/22 0906 01/15/22 1636 01/16/22 0543  HGB 11.0*  --  10.8*  --   --  9.5*  HCT 33.5*  --  33.2*  --   --  29.3*  PLT 270  --  270  --   --  283  LABPROT 13.8  --   --   --   --   --   INR 1.1  --   --   --   --   --   HEPARINUNFRC  --   --   --  0.42 0.43 0.36  CREATININE 2.23*  --  2.31*  --   --  2.57*  TROPONINIHS 12 13  --   --   --   --      Estimated Creatinine Clearance: 25.4 mL/min (A) (by C-G formula based on SCr of 2.57 mg/dL (H)).   Medications:  Infusions:   azithromycin 500 mg (01/15/22 2227)   cefTRIAXone (ROCEPHIN)  IV 2 g (01/15/22 2137)   heparin 1,300 Units/hr (01/15/22 1855)    Assessment: 25 yoM presented to ED on 1/6 with SOB requiring oxygen, cold symptoms x1 week, increased LE edema.  Pharmacy was consulted to dose Heparin IV for r/o PE due to history of DVT, elevated D-dimer, unable to get CTa with AKI and venous dopplers are pending.  Today, 01/16/2022:   Heparin level 0.36, therapeutic on heparin 1300 units/hr CBC:  Hgb down to 9.5, Plt WNL No bleeding or complications reported  Goal of Therapy:  Heparin level 0.3-0.7 units/ml Monitor platelets by anticoagulation protocol: Yes   Plan:  Continue heparin IV infusion at 1300 units/hr Daily heparin level and CBC F/u VTE imaging   Peggyann Juba, PharmD, BCPS [facility] main pharmacy [phone] 01/16/2022 7:30 AM

## 2022-01-17 DIAGNOSIS — J189 Pneumonia, unspecified organism: Secondary | ICD-10-CM | POA: Diagnosis not present

## 2022-01-17 DIAGNOSIS — E1122 Type 2 diabetes mellitus with diabetic chronic kidney disease: Secondary | ICD-10-CM | POA: Diagnosis not present

## 2022-01-17 DIAGNOSIS — N1832 Chronic kidney disease, stage 3b: Secondary | ICD-10-CM | POA: Diagnosis not present

## 2022-01-17 DIAGNOSIS — J9601 Acute respiratory failure with hypoxia: Secondary | ICD-10-CM | POA: Diagnosis not present

## 2022-01-17 LAB — BASIC METABOLIC PANEL
Anion gap: 8 (ref 5–15)
BUN: 41 mg/dL — ABNORMAL HIGH (ref 8–23)
CO2: 21 mmol/L — ABNORMAL LOW (ref 22–32)
Calcium: 7.8 mg/dL — ABNORMAL LOW (ref 8.9–10.3)
Chloride: 110 mmol/L (ref 98–111)
Creatinine, Ser: 2.57 mg/dL — ABNORMAL HIGH (ref 0.61–1.24)
GFR, Estimated: 24 mL/min — ABNORMAL LOW (ref >60)
Glucose, Bld: 197 mg/dL — ABNORMAL HIGH (ref 70–99)
Potassium: 3.4 mmol/L — ABNORMAL LOW (ref 3.5–5.1)
Sodium: 139 mmol/L (ref 135–145)

## 2022-01-17 LAB — MAGNESIUM: Magnesium: 1.8 mg/dL (ref 1.7–2.4)

## 2022-01-17 LAB — CBC
HCT: 31.2 % — ABNORMAL LOW (ref 39.0–52.0)
Hemoglobin: 10 g/dL — ABNORMAL LOW (ref 13.0–17.0)
MCH: 30.5 pg (ref 26.0–34.0)
MCHC: 32.1 g/dL (ref 30.0–36.0)
MCV: 95.1 fL (ref 80.0–100.0)
Platelets: 277 10*3/uL (ref 150–400)
RBC: 3.28 MIL/uL — ABNORMAL LOW (ref 4.22–5.81)
RDW: 13.3 % (ref 11.5–15.5)
WBC: 9.4 10*3/uL (ref 4.0–10.5)
nRBC: 0 % (ref 0.0–0.2)

## 2022-01-17 LAB — GLUCOSE, CAPILLARY
Glucose-Capillary: 169 mg/dL — ABNORMAL HIGH (ref 70–99)
Glucose-Capillary: 190 mg/dL — ABNORMAL HIGH (ref 70–99)

## 2022-01-17 MED ORDER — POTASSIUM CHLORIDE CRYS ER 20 MEQ PO TBCR
40.0000 meq | EXTENDED_RELEASE_TABLET | Freq: Once | ORAL | Status: AC
  Administered 2022-01-17: 40 meq via ORAL
  Filled 2022-01-17: qty 2

## 2022-01-17 MED ORDER — AZITHROMYCIN 250 MG PO TABS: 500.0000 mg | ORAL_TABLET | Freq: Every day | ORAL | Status: DC

## 2022-01-17 MED ORDER — CEFDINIR 300 MG PO CAPS: 300.0000 mg | ORAL_CAPSULE | Freq: Every day | ORAL | 0 refills | Status: AC

## 2022-01-17 MED ORDER — AZITHROMYCIN 250 MG PO TABS: 250.0000 mg | ORAL_TABLET | Freq: Every day | ORAL | 0 refills | Status: AC

## 2022-01-17 NOTE — Progress Notes (Signed)
Physician Discharge Summary  David Mendoza EXB:[id number] DOB: 08-Jun-1940 DOA: 01/14/2022  PCP: Kathalene Frames, MD  Admit date: 01/14/2022 Discharge date: 01/17/2022  Admitted From: Home  Discharge disposition: Home   Recommendations for Outpatient Follow-Up:   Follow up with your primary care provider in one week.  Check CBC, BMP, magnesium in the next visit  Discharge Diagnosis:   Principal Problem:   Acute respiratory failure with hypoxia (HCC) Active Problems:   Multifocal pneumonia   Stage 3b chronic kidney disease (CKD) (HCC)   DM2 (diabetes mellitus, type 2) (HCC)   HTN (hypertension)   Discharge Condition: Improved.  Diet recommendation: Low sodium, heart healthy.  Carbohydrate-modified.    Wound care: None.  Code status: Full.   History of Present Illness:   David Mendoza is a 82 y.o. male with past medical history of DVT x 3 in the past no longer on anticoagulation, history of craniotomy for traumatic SDH in 2016, diabetes mellitus type 2, hypertension, hyperlipidemia presented to hospital with cough and cold for 1 week with progressive shortness of breath and increasing lower extremity edema.  In the ED, patient was tachypneic, blood pressure was elevated, he was on 5 to 6 L of oxygen by nasal cannula.  Labs showed leukocytosis of 17 K.  Creatinine was elevated at 2.2.  BNP 630.  D-dimer was 2.0.  COVID flu and RSV was negative.   CT chest without contrast shows mucosal thickening bilaterally with multifocal mucous plugging scattered airspace opacities bilaterally with consolidation in right upper and lower lobe and left lower lobe compatible with pneumonia with small bilateral pleural effusion and pericardial effusion with mediastinal lymphadenopathy. Patient was started on IV heparin gtt. as PE could not ruled out.  He was also started on broad-spectrum antibiotics and was admitted for further evaluation management.   Hospital Course:   Following conditions  were addressed during hospitalization as listed below,  Acute hypoxemic respiratory failure likely in the setting of multifocal pneumonia: Improved at this time clinical but has been requiring oxygen especially on mobility up to 4 L.  Afebrile.  Leukocytosis has resolved and has normalized to 9.4 from initial 15 K.  Lactic acid within normal limits.  Patient received Rocephin and Zithromax during hospitalization and will be discharged on Omnicef and Zithromax for next 3 days renally dosed to complete 5-day course.  Patient was initially on heparin drip empirically but lower extremity DVT ultrasound was negative.  Heparin drip was discontinued.   2D echocardiogram with LV ejection fraction of 60 to 65% with no regional wall motion abnormality.  This time patient will be continued on oxygen on discharge.  Will need to follow-up with his primary care physician as outpatient.   Bilateral lower extremity edema: Duplex ultrasound of the lower extremity negative for DVT.   Mild AKI on CKD stage IIIb: Creatinine today at 2.5.  Recommend outpatient monitoring with BMP.  Will discontinue Motrin on discharge.   Hypertension:  On Lasix losartan and Coreg from home.  Will be resumed on discharge.   Hyperlipidemia: Continue statins.   Depression/anxiety: Continue Zoloft   Grade 1 diastolic dysfunction: PAH: Noted on CT. Repeat 2D echocardiogram with LV ejection fraction of 60 to 65%.  Pulmonary hypertension reported.  On losartan, Lasix and Coreg as outpatient.   Aortic atherosclerosis/Ascending aorta aneurysm: Outpatient follow-up recommended.  Continue simvastatin.  GERD.  Continue omeprazole.   Type 2 diabetes with hyperglycemia:  Resume metformin and glipizide on discharge.  Hemoglobin A1c  was 8.5.   Disposition.  At this time, patient is stable for disposition home with outpatient PCP follow-up.  Spoke with the patient's daughter on the phone about disposition.  Medical Consultants:    None.  Procedures:    None Subjective:   Today, patient was seen and examined at bedside.  Continues to feel better but was little hypoxic on ambulation.  Seen by physical therapy recommended outpatient PT.  Has qualified for home oxygen but clinically improving.  Discharge Exam:   Vitals:   01/17/22 0602 01/17/22 1214  BP: (!) 167/80 (!) 185/84  Pulse: 69 70  Resp: (!) 23 (!) 22  Temp: 97.9 F (36.6 C) 97.7 F (36.5 C)  SpO2: (!) 89% 95%   Vitals:   01/16/22 1300 01/16/22 2026 01/17/22 0602 01/17/22 1214  BP: (!) 154/71 (!) 167/77 (!) 167/80 (!) 185/84  Pulse:  72 69 70  Resp:  20 (!) 23 (!) 22  Temp:  97.8 F (36.6 C) 97.9 F (36.6 C) 97.7 F (36.5 C)  TempSrc:   Oral Oral  SpO2:  91% (!) 89% 95%  Weight:      Height:       General:  alert awake and Communicative, not in obvious distress HENT: pupils equally reacting to light,  No scleral pallor or icterus noted. Oral mucosa is moist.  Chest: .  Diminished breath sounds bilaterally.  CVS: S1 &S2 heard. No murmur.  Regular rate and rhythm. Abdomen: Soft, nontender, nondistended.  Bowel sounds are heard.   Extremities: No cyanosis, clubbing or edema.  Peripheral pulses are palpable. Psych: Alert, awake and oriented, normal mood CNS:  No cranial nerve deficits.  Power equal in all extremities.   Skin: Warm and dry.  No rashes noted.  The results of significant diagnostics from this hospitalization (including imaging, microbiology, ancillary and laboratory) are listed below for reference.     Diagnostic Studies:   ECHOCARDIOGRAM LIMITED  Result Date: 01/15/2022    ECHOCARDIOGRAM LIMITED REPORT   Patient Name:   David Mendoza Date of Exam: 01/15/2022 Medical Rec #:  [id number]      Height:       70.0 in Accession #:    [id number]     Weight:       197.0 lb Date of Birth:  Mar 01, 1940       BSA:          2.074 m Patient Age:    19 years       BP:           156/74 mmHg Patient Gender: M              HR:           88  bpm. Exam Location:  Inpatient Procedure: Limited Echo, Color Doppler and Cardiac Doppler Indications:    Pulmonary embolus/PHTN  History:        Patient has prior history of Echocardiogram examinations, most                 recent 12/23/2021. Signs/Symptoms:Shortness of Breath, Dyspnea                 and Edema; Risk Factors:Hypertension, Diabetes and Dyslipidemia.                 Hx of DVT.  Sonographer:    Eartha Inch Referring Phys: [phone] JARED M GARDNER  Sonographer Comments: Image acquisition challenging due to patient body habitus and Image acquisition challenging due  to respiratory motion. IMPRESSIONS  1. Left ventricular ejection fraction, by estimation, is 60 to 65%. The left ventricle has normal function. The left ventricle has no regional wall motion abnormalities. There is mild left ventricular hypertrophy.  2. Right ventricular systolic function is normal. The right ventricular size is normal. Tricuspid regurgitation signal is inadequate for assessing PA pressure.  3. Left atrial size was mild to moderately dilated.  4. A small pericardial effusion is present. There is no evidence of cardiac tamponade.  5. The mitral valve is grossly normal. Mild mitral valve regurgitation. No evidence of mitral stenosis.  6. The aortic valve is tricuspid. Aortic valve regurgitation is not visualized. No aortic stenosis is present.  7. The inferior vena cava is normal in size with greater than 50% respiratory variability, suggesting right atrial pressure of 3 mmHg. Comparison(s): No significant change from prior study. Conclusion(s)/Recommendation(s): RV/RA appear normal, no signficant TR to estimate pressures, IVC normal and collapses. FINDINGS  Left Ventricle: Left ventricular ejection fraction, by estimation, is 60 to 65%. The left ventricle has normal function. The left ventricle has no regional wall motion abnormalities. The left ventricular internal cavity size was normal in size. There is  mild left  ventricular hypertrophy. Right Ventricle: The right ventricular size is normal. No increase in right ventricular wall thickness. Right ventricular systolic function is normal. Tricuspid regurgitation signal is inadequate for assessing PA pressure. Left Atrium: Left atrial size was mild to moderately dilated. Right Atrium: Right atrial size was normal in size. Pericardium: A small pericardial effusion is present. There is no evidence of cardiac tamponade. Mitral Valve: The mitral valve is grossly normal. Mild mitral valve regurgitation. No evidence of mitral valve stenosis. Tricuspid Valve: The tricuspid valve is not well visualized. Tricuspid valve regurgitation is not demonstrated. No evidence of tricuspid stenosis. Aortic Valve: The aortic valve is tricuspid. Aortic valve regurgitation is not visualized. No aortic stenosis is present. Pulmonic Valve: The pulmonic valve was not well visualized. Pulmonic valve regurgitation is not visualized. Venous: The inferior vena cava is normal in size with greater than 50% respiratory variability, suggesting right atrial pressure of 3 mmHg. Additional Comments: Spectral Doppler performed. Color Doppler performed.  LEFT VENTRICLE PLAX 2D LVIDd:         4.80 cm LVIDs:         3.20 cm LV PW:         1.10 cm LV IVS:        1.00 cm  LV Volumes (MOD) LV vol d, MOD A2C: 118.0 ml LV vol d, MOD A4C: 141.0 ml LV vol s, MOD A2C: 43.9 ml LV vol s, MOD A4C: 48.8 ml LV SV MOD A2C:     74.1 ml LV SV MOD A4C:     141.0 ml LV SV MOD BP:      83.1 ml IVC IVC diam: 1.60 cm LEFT ATRIUM         Index LA diam:    3.60 cm 1.74 cm/m Buford Dresser MD Electronically signed by Buford Dresser MD Signature Date/Time: 01/15/2022/12:25:41 PM    Final    CT Chest Wo Contrast  Result Date: 01/15/2022 CLINICAL DATA:  Respiratory illness, nondiagnostic x-ray. Cough and cold like symptoms. Shortness of breath today. EXAM: CT CHEST WITHOUT CONTRAST TECHNIQUE: Multidetector CT imaging of the chest  was performed following the standard protocol without IV contrast. RADIATION DOSE REDUCTION: This exam was performed according to the departmental dose-optimization program which includes automated exposure control, adjustment of the  mA and/or kV according to patient size and/or use of iterative reconstruction technique. COMPARISON:  11/04/2021. FINDINGS: Cardiovascular: The heart is normal in size and there is a small to moderate pericardial effusion. There is atherosclerotic calcification of the aorta with mild aneurysmal dilatation of the ascending aorta measuring 4 cm. The pulmonary trunk is distended suggesting underlying pulmonary artery hypertension. Mediastinum/Nodes: Multiple enlarged lymph nodes are present in the mediastinum measuring up to 1.5 cm in the right paratracheal space a 1.6 cm in the subcarinal space. No axillary lymphadenopathy. Evaluation of the hila is limited due to lack of IV contrast. The thyroid gland and esophagus are within normal limits. Debris is noted in the mid trachea. Lungs/Pleura: There is a small right pleural effusion and trace left pleural effusion. Bronchial wall thickening is noted bilaterally with areas of mucous plugging. Scattered airspace opacities are noted in the lungs bilaterally. There is consolidation in the right upper and lower lobes and left lower lobe. No pneumothorax. Upper Abdomen: There is a cyst in the right upper quadrant, possible right renal cyst. No acute abnormality. Musculoskeletal: Anasarca is present. Old rib fractures on the right. Degenerative changes in the thoracic spine. No acute osseous abnormality. IMPRESSION: 1. Mucosal thickening bilaterally with multifocal mucous plugging, scattered airspace opacities bilaterally with consolidation in the right upper and lower lobes and left lower lobe, compatible with pneumonia. 2. Small right pleural effusion and trace left pleural effusion. 3. Small to moderate pericardial effusion. 4. Mediastinal  lymphadenopathy, likely reactive. 5. Aortic atherosclerosis with mild aneurysmal dilatation of the ascending aorta measuring 4 cm. Recommend annual imaging followup by CTA or MRA. This recommendation follows 2010 ACCF/AHA/AATS/ACR/ASA/SCA/SCAI/SIR/STS/SVM Guidelines for the Diagnosis and Management of Patients with Thoracic Aortic Disease. Circulation. 2010; 121: E703-J009. Aortic aneurysm NOS (ICD10-I71.9) 6. Distended pulmonary trunk suggesting underlying pulmonary artery hypertension. 7. Anasarca. Electronically Signed   By: Brett Fairy M.D.   On: 01/15/2022 01:24   DG Chest Port 1 View  Result Date: 01/14/2022 CLINICAL DATA:  Chest pain. EXAM: PORTABLE CHEST 1 VIEW COMPARISON:  Chest radiograph dated November 28, 2021. FINDINGS: The heart is enlarged. Mild pulmonary vascular congestion. Bibasilar opacities, right greater than the left suggesting small effusions and/or atelectasis. Thoracic spondylosis. IMPRESSION: 1. Cardiomegaly with mild pulmonary vascular congestion. 2. Bibasilar opacities, right greater than left suggesting small effusions and/or atelectasis. Electronically Signed   By: Keane Police D.O.   On: 01/14/2022 23:49     Labs:   Basic Metabolic Panel: Recent Labs  Lab 01/14/22 2210 01/15/22 0531 01/16/22 0543 01/17/22 0511  NA 142 140 138 139  K 4.0 4.0 3.6 3.4*  CL 106 110 107 110  CO2 24 18* 21* 21*  GLUCOSE 231* 261* 219* 197*  BUN 29* 31* 44* 41*  CREATININE 2.23* 2.31* 2.57* 2.57*  CALCIUM 8.8* 8.3* 8.0* 7.8*  MG  --   --  1.8 1.8   GFR Estimated Creatinine Clearance: 25.4 mL/min (A) (by C-G formula based on SCr of 2.57 mg/dL (H)). Liver Function Tests: Recent Labs  Lab 01/15/22 0531  AST 18  ALT 20  ALKPHOS 113  BILITOT 0.9  PROT 7.4  ALBUMIN 2.5*   No results for input(s): "LIPASE", "AMYLASE" in the last 168 hours. No results for input(s): "AMMONIA" in the last 168 hours. Coagulation profile Recent Labs  Lab 01/14/22 2210  INR 1.1     CBC: Recent Labs  Lab 01/14/22 2210 01/15/22 0531 01/16/22 0543 01/17/22 0511  WBC 17.0* 15.1* 14.9* 9.4  HGB  11.0* 10.8* 9.5* 10.0*  HCT 33.5* 33.2* 29.3* 31.2*  MCV 94.4 95.7 94.8 95.1  PLT 270 270 283 277   Cardiac Enzymes: No results for input(s): "CKTOTAL", "CKMB", "CKMBINDEX", "TROPONINI" in the last 168 hours. BNP: Invalid input(s): "POCBNP" CBG: Recent Labs  Lab 01/16/22 1119 01/16/22 1712 01/16/22 2025 01/17/22 0735 01/17/22 1132  GLUCAP 172* 116* 166* 169* 190*   D-Dimer Recent Labs    01/14/22 2210  DDIMER 2.08*   Hgb A1c Recent Labs    01/15/22 0531  HGBA1C 8.5*   Lipid Profile No results for input(s): "CHOL", "HDL", "LDLCALC", "TRIG", "CHOLHDL", "LDLDIRECT" in the last 72 hours. Thyroid function studies No results for input(s): "TSH", "T4TOTAL", "T3FREE", "THYROIDAB" in the last 72 hours.  Invalid input(s): "FREET3" Anemia work up No results for input(s): "VITAMINB12", "FOLATE", "FERRITIN", "TIBC", "IRON", "RETICCTPCT" in the last 72 hours. Microbiology Recent Results (from the past 240 hour(s))  Resp panel by RT-PCR (RSV, Flu A&B, Covid) Anterior Nasal Swab     Status: None   Collection Time: 01/14/22 10:10 PM   Specimen: Anterior Nasal Swab  Result Value Ref Range Status   SARS Coronavirus 2 by RT PCR NEGATIVE NEGATIVE Final    Comment: (NOTE) SARS-CoV-2 target nucleic acids are NOT DETECTED.  The SARS-CoV-2 RNA is generally detectable in upper respiratory specimens during the acute phase of infection. The lowest concentration of SARS-CoV-2 viral copies this assay can detect is 138 copies/mL. A negative result does not preclude SARS-Cov-2 infection and should not be used as the sole basis for treatment or other patient management decisions. A negative result may occur with  improper specimen collection/handling, submission of specimen other than nasopharyngeal swab, presence of viral mutation(s) within the areas targeted by this  assay, and inadequate number of viral copies(<138 copies/mL). A negative result must be combined with clinical observations, patient history, and epidemiological information. The expected result is Negative.  Fact Sheet for Patients:  EntrepreneurPulse.com.au  Fact Sheet for Healthcare Providers:  IncredibleEmployment.be  This test is no t yet approved or cleared by the [place] FDA and  has been authorized for detection and/or diagnosis of SARS-CoV-2 by FDA under an Emergency Use Authorization (EUA). This EUA will remain  in effect (meaning this test can be used) for the duration of the COVID-19 declaration under Section 564(b)(1) of the Act, 21 U.S.C.section 360bbb-3(b)(1), unless the authorization is terminated  or revoked sooner.       Influenza A by PCR NEGATIVE NEGATIVE Final   Influenza B by PCR NEGATIVE NEGATIVE Final    Comment: (NOTE) The Xpert Xpress SARS-CoV-2/FLU/RSV plus assay is intended as an aid in the diagnosis of influenza from Nasopharyngeal swab specimens and should not be used as a sole basis for treatment. Nasal washings and aspirates are unacceptable for Xpert Xpress SARS-CoV-2/FLU/RSV testing.  Fact Sheet for Patients: EntrepreneurPulse.com.au  Fact Sheet for Healthcare Providers: IncredibleEmployment.be  This test is not yet approved or cleared by the [place] FDA and has been authorized for detection and/or diagnosis of SARS-CoV-2 by FDA under an Emergency Use Authorization (EUA). This EUA will remain in effect (meaning this test can be used) for the duration of the COVID-19 declaration under Section 564(b)(1) of the Act, 21 U.S.C. section 360bbb-3(b)(1), unless the authorization is terminated or revoked.     Resp Syncytial Virus by PCR NEGATIVE NEGATIVE Final    Comment: (NOTE) Fact Sheet for Patients: EntrepreneurPulse.com.au  Fact Sheet for  Healthcare Providers: IncredibleEmployment.be  This test is not yet  approved or cleared by the [place] and has been authorized for detection and/or diagnosis of SARS-CoV-2 by FDA under an Emergency Use Authorization (EUA). This EUA will remain in effect (meaning this test can be used) for the duration of the COVID-19 declaration under Section 564(b)(1) of the Act, 21 U.S.C. section 360bbb-3(b)(1), unless the authorization is terminated or revoked.  Performed at [facility], [address], [place], [place] [address]   Blood culture (routine x 2)     Status: None (Preliminary result)   Collection Time: 01/15/22 12:13 AM   Specimen: BLOOD  Result Value Ref Range Status   Specimen Description   Final    BLOOD BLOOD LEFT ARM Performed at [facility], [address], [place], [place] [address]    Special Requests   Final    BOTTLES DRAWN AEROBIC ONLY Blood Culture adequate volume Performed at [facility], [address], [place], [place] [address]    Culture   Final    NO GROWTH 2 DAYS Performed at [facility], [place] [address] [place], [place] [address]    Report Status PENDING  Incomplete  Blood culture (routine x 2)     Status: None (Preliminary result)   Collection Time: 01/15/22 12:16 AM   Specimen: BLOOD  Result Value Ref Range Status   Specimen Description   Final    BLOOD BLOOD RIGHT HAND Performed at [facility], [address], [place], [place] [address]    Special Requests   Final    BOTTLES DRAWN AEROBIC AND ANAEROBIC Blood Culture adequate volume Performed at [facility], [address] [place], [place] [address]    Culture   Final    NO GROWTH 2 DAYS Performed at [facility], [place] [address], [place], [place] [address]    Report Status PENDING  Incomplete  Respiratory (~20 pathogens) panel by PCR     Status: None    Collection Time: 01/15/22  4:25 AM   Specimen: Nasopharyngeal Swab; Respiratory  Result Value Ref Range Status   Adenovirus NOT DETECTED NOT DETECTED Final   Coronavirus 229E NOT DETECTED NOT DETECTED Final    Comment: (NOTE) The Coronavirus on the Respiratory Panel, DOES NOT test for the novel  Coronavirus (2019 nCoV)    Coronavirus HKU1 NOT DETECTED NOT DETECTED Final   Coronavirus NL63 NOT DETECTED NOT DETECTED Final   Coronavirus OC43 NOT DETECTED NOT DETECTED Final   Metapneumovirus NOT DETECTED NOT DETECTED Final   Rhinovirus / Enterovirus NOT DETECTED NOT DETECTED Final   Influenza A NOT DETECTED NOT DETECTED Final   Influenza B NOT DETECTED NOT DETECTED Final   Parainfluenza Virus 1 NOT DETECTED NOT DETECTED Final   Parainfluenza Virus 2 NOT DETECTED NOT DETECTED Final   Parainfluenza Virus 3 NOT DETECTED NOT DETECTED Final   Parainfluenza Virus 4 NOT DETECTED NOT DETECTED Final   Respiratory Syncytial Virus NOT DETECTED NOT DETECTED Final   Bordetella pertussis NOT DETECTED NOT DETECTED Final   Bordetella Parapertussis NOT DETECTED NOT DETECTED Final   Chlamydophila pneumoniae NOT DETECTED NOT DETECTED Final   Mycoplasma pneumoniae NOT DETECTED NOT DETECTED Final    Comment: Performed at [facility], [place]. [address], [place], [place] [address]     Discharge Instructions:   Discharge Instructions     Diet Carb Modified   Complete by: As directed    Discharge instructions   Complete by: As directed    Follow-up  with your primary care provider in 1 week.  Check blood work at that time.  Complete the course of antibiotic.  No overexertion.  Continue oxygen at home.  Continue physical therapy as outpatient.  Seek medical attention for worsening symptoms.   Increase activity slowly   Complete by: As directed       Allergies as of 01/17/2022       Reactions   Sulfa Antibiotics Diarrhea        Medication List     STOP taking these medications    ibuprofen  200 MG tablet Commonly known as: ADVIL   Pfizer COVID-19 Vac Bivalent injection Generic drug: COVID-19 mRNA bivalent vaccine Therapist, music)       TAKE these medications    acetaminophen 500 MG tablet Commonly known as: TYLENOL Take 1,000 mg by mouth every 6 (six) hours as needed for mild pain.   amLODipine 5 MG tablet Commonly known as: NORVASC Take 5 mg by mouth daily.   azithromycin 250 MG tablet Commonly known as: ZITHROMAX Take 1 tablet (250 mg total) by mouth daily for 3 days. Start taking on: January 18, 2022   B-12 1000 MCG Tbcr Take 1 tablet by mouth daily.   carvedilol 25 MG tablet Commonly known as: COREG Take 25 mg by mouth 2 (two) times daily.   cefdinir 300 MG capsule Commonly known as: OMNICEF Take 1 capsule (300 mg total) by mouth daily for 3 days. Start taking on: January 18, 2022   ferrous gluconate 324 MG tablet Commonly known as: FERGON Take 324 mg by mouth every other day.   folic acid 956 MCG tablet Commonly known as: FOLVITE Take 800 mcg by mouth daily.   furosemide 20 MG tablet Commonly known as: LASIX Take 20 mg by mouth every other day.   glipiZIDE 2.5 MG 24 hr tablet Commonly known as: GLUCOTROL XL Take 2.5 mg by mouth in the morning and at bedtime.   losartan 100 MG tablet Commonly known as: COZAAR Take 100 mg by mouth daily.   metFORMIN 500 MG tablet Commonly known as: GLUCOPHAGE Take 500 mg by mouth 2 (two) times daily with a meal.   omeprazole 40 MG capsule Commonly known as: PRILOSEC Take 40 mg by mouth daily.   OVER THE COUNTER MEDICATION Take 1 tablet by mouth daily. Juice Plus veggie and garden blend   sertraline 50 MG tablet Commonly known as: ZOLOFT Take 75 mg by mouth every morning.   simvastatin 40 MG tablet Commonly known as: ZOCOR Take 40 mg by mouth every evening.               Durable Medical Equipment  (From admission, onward)           Start     Ordered   01/17/22 1059  For home use only  DME oxygen  Once       Question Answer Comment  Length of Need 6 Months   Mode or (Route) Nasal cannula   Liters per Minute 4   Frequency Continuous (stationary and portable oxygen unit needed)   Oxygen conserving device Yes   Oxygen delivery system Gas      01/17/22 1058              Time coordinating discharge: 39 minutes  Signed:  [place]  [facility] 01/17/2022, 3:05 PM

## 2022-01-17 NOTE — Progress Notes (Signed)
PHARMACIST - PHYSICIAN COMMUNICATION DR:   Louanne Belton CONCERNING: Antibiotic IV to Oral Route Change Policy  RECOMMENDATION: This patient is receiving azithromycin by the intravenous route.  Based on criteria approved by the Pharmacy and Therapeutics Committee, the antibiotic(s) is/are being converted to the equivalent oral dose form(s).   DESCRIPTION: These criteria include: Patient being treated for a respiratory tract infection, urinary tract infection, cellulitis or clostridium difficile associated diarrhea if on metronidazole The patient is not neutropenic and does not exhibit a GI malabsorption state The patient is eating (either orally or via tube) and/or has been taking other orally administered medications for a least 24 hours The patient is improving clinically and has a Tmax < 100.5  If you have questions about this conversion, please contact the Pharmacy Department  '[]'$   [phone] )  [facility] '[]'$   [phone] )  [facility] '[]'$   [phone] )  [facility] '[]'$   [phone] )  [facility] '[x]'$   [phone] )  [place], PharmD, BCPS 01/17/2022 8:07 AM

## 2022-01-17 NOTE — Plan of Care (Signed)
Patient ataxic when ambulating with walker to bathroom at beginning of shift.  Uses urinal for additional urination requirements.  Discuss improved breathing and potential for respiratory disfunctions while ambulating later in day

## 2022-01-17 NOTE — Evaluation (Addendum)
Physical Therapy Evaluation Patient Details Name: David Mendoza MRN: [id number] DOB: 03-07-1940 Today's Date: 01/17/2022  History of Present Illness  82 yo male admitted with acute respiratory failure, Pna. 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 She walked ~175 feet with a RW. O2 88% on 3L, 92% on 4L, Pt tolerated distance well. He remains unsteady and is agreeable to using a RW for ambulation safety. Pt denied need to practice stairs. Plan is for d/c home later today per RN.        Recommendations for follow up therapy are one component of a multi-disciplinary discharge planning process, led by the attending physician.  Recommendations may be updated based on patient status, additional functional criteria and insurance authorization.  Follow Up Recommendations Outpatient PT (pt prefers to go to [facility] where son works)      Assistance Recommended at Discharge    Patient can return home with the following  A little help with walking and/or transfers;A little help with bathing/dressing/bathroom;Assistance with cooking/housework;Assist for transportation;Help with stairs or ramp for entrance    Equipment Recommendations Rolling walker (2 wheels)  Recommendations for Other Services       Functional Status Assessment Patient has had a recent decline in their functional status and demonstrates the ability to make significant improvements in function in a reasonable and predictable amount of time.     Precautions / Restrictions Precautions Precautions: Fall Precaution Comments: requiring O2/monitor Restrictions Weight Bearing Restrictions: No      Mobility  Bed Mobility Overal bed mobility: Modified Independent             General bed mobility comments: increased time.    Transfers Overall transfer level: Needs assistance Equipment used: Rolling  walker (2 wheels) Transfers: Sit to/from Stand Sit to Stand: Min guard, From elevated surface           General transfer comment: Min guard A for safety.    Ambulation/Gait Ambulation/Gait assistance: Min assist, Min guard Gait Distance (Feet): 175 Feet Assistive device: Rolling walker (2 wheels) Gait Pattern/deviations: Step-through pattern, Decreased stride length       General Gait Details: Min A to steady x 1 within first 10 feet (while turning out of room). Min guard A for remainder of distance. O2 88% on 3L, 93% on 4L.  Stairs            Wheelchair Mobility    Modified Rankin (Stroke Patients Only)       Balance Overall balance assessment: Needs assistance, History of Falls         Standing balance support: Bilateral upper extremity supported, Reliant on assistive device for balance, During functional activity Standing balance-Leahy Scale: Poor                               Pertinent Vitals/Pain Pain Assessment Pain Assessment: No/denies pain    Home Living Family/patient expects to be discharged to:: Private residence Living Arrangements: Spouse/significant other Available Help at Discharge: Family;Available PRN/intermittently Type of Home: House Home Access: Stairs to enter Entrance Stairs-Rails: None Entrance Stairs-Number of Steps: 2   Home Layout: Able to live on main level with bedroom/bathroom Home Equipment: Cane - single point;Grab bars - tub/shower      Prior Function Prior Level of Function : Independent/Modified Independent  Mobility Comments: using cane ADLs Comments: mod ind     Hand Dominance        Extremity/Trunk Assessment   Upper Extremity Assessment Upper Extremity Assessment: Overall WFL for tasks assessed    Lower Extremity Assessment Lower Extremity Assessment: Generalized weakness (hx of neuropathy)    Cervical / Trunk Assessment Cervical / Trunk Assessment: Normal   Communication   Communication: No difficulties  Cognition Arousal/Alertness: Awake/alert Behavior During Therapy: WFL for tasks assessed/performed Overall Cognitive Status: Within Functional Limits for tasks assessed                                          General Comments      Exercises     Assessment/Plan    PT Assessment All further PT needs can be met in the next venue of care ([facility]-pt declines [facility] f/u prefers to have outpatient-he is planning to go where his son works)  PT Problem List Decreased balance;Decreased mobility;Decreased activity tolerance;Decreased knowledge of use of DME       PT Treatment Interventions      PT Goals (Current goals can be found in the Care Plan section)  Acute Rehab PT Goals Patient Stated Goal: home today PT Goal Formulation: All assessment and education complete, DC therapy    Frequency       Co-evaluation               AM-PAC PT "6 Clicks" Mobility  Outcome Measure Help needed turning from your back to your side while in a flat bed without using bedrails?: None Help needed moving from lying on your back to sitting on the side of a flat bed without using bedrails?: None Help needed moving to and from a bed to a chair (including a wheelchair)?: A Little Help needed standing up from a chair using your arms (e.g., wheelchair or bedside chair)?: A Little Help needed to walk in hospital room?: A Little Help needed climbing 3-5 steps with a railing? : A Little 6 Click Score: 20    End of Session Equipment Utilized During Treatment: Gait belt;Oxygen Activity Tolerance: Patient tolerated treatment well Patient left: in bed;with call bell/phone within reach;with family/visitor present   PT Visit Diagnosis: History of falling (Z91.81);Difficulty in walking, not elsewhere classified (R26.2)    Time: 9604-5409 PT Time Calculation (min) (ACUTE ONLY): 15 min   Charges:   PT Evaluation $PT Eval Low Complexity: [place], PT Acute Rehabilitation  Office: [phone]

## 2022-01-17 NOTE — TOC Progression Note (Signed)
Transition of Care [facility]) - Progression Note    Patient Details  Name: David Mendoza MRN: [id number] Date of Birth: 1940-06-14  [facility] CM/SW Contact  Purcell Mouton, RN Phone Number: 01/17/2022, 3:02 PM  Clinical Narrative:     Spoke with pt concerning home O2, [employer] was selected and called for O2.  Expected Discharge Plan: Home/Self Care Barriers to Discharge: No Barriers Identified  Expected Discharge Plan and Services   Discharge Planning Services: CM Consult Post Acute Care Choice: [place] arrangements for the past 2 months: Single Family Home Expected Discharge Date: 01/17/22               DME Arranged: Oxygen DME Agency: [employer] Date DME Agency Contacted: 01/17/22 Time DME Agency Contacted: 1501 Representative spoke with at DME Agency: [place] Determinants of Health ([place]) Interventions SDOH Screenings   Food Insecurity: No Food Insecurity (01/15/2022)  Housing: Low Risk  (01/15/2022)  Transportation Needs: No Transportation Needs (01/15/2022)  Utilities: Not At Risk (01/15/2022)  Tobacco Use: Medium Risk (01/14/2022)    Readmission Risk Interventions     No data to display

## 2022-01-17 NOTE — Progress Notes (Signed)
Mobility Specialist - Progress Note   01/17/22 1004  Mobility  Activity Ambulated with assistance in hallway  Level of Assistance Minimal assist, patient does 75% or more  Assistive Device Front wheel walker  Distance Ambulated (ft) 200 ft  Range of Motion/Exercises Active  Activity Response Tolerated well  Mobility Referral Yes  $Mobility charge 1 Mobility   Pt was found in bed and agreeable to ambulate. Was min-A to go from sitting to standing and stand-by for ambulation. RN completed O2 screen during ambulation. Stated feeling SOB and winded on RA. RN adjusted O2 needs during ambulation and he stated feeling better after pausing and practicing pursed lip breathing with O2. At EOS returned to bed with all necessities.  Ferd Hibbs Mobility Specialist

## 2022-01-17 NOTE — Progress Notes (Signed)
SATURATION QUALIFICATIONS: (This note is used to comply with regulatory documentation for home oxygen)  Patient Saturations on Room Air at Rest = 90%  Patient Saturations on Room Air while Ambulating = 83%  Patient Saturations on 4 Liters of oxygen while Ambulating = 93%  Please briefly explain why patient needs home oxygen: To maintain safe level of oxygen saturation.

## 2022-01-18 NOTE — Progress Notes (Deleted)
Physician Discharge Summary  David Mendoza EOF:[id number] DOB: September 27, 1940 DOA: 01/14/2022  PCP: Kathalene Frames, MD  Admit date: 01/14/2022 Discharge date: 01/17/2022  Admitted From: Home  Discharge disposition: Home   Recommendations for Outpatient Follow-Up:   Follow up with your primary care provider in one week.  Check CBC, BMP, magnesium in the next visit  Discharge Diagnosis:   Principal Problem:   Acute respiratory failure with hypoxia (HCC) Active Problems:   Multifocal pneumonia   Stage 3b chronic kidney disease (CKD) (HCC)   DM2 (diabetes mellitus, type 2) (HCC)   HTN (hypertension)   Discharge Condition: Improved.  Diet recommendation: Low sodium, heart healthy.  Carbohydrate-modified.    Wound care: None.  Code status: Full.   History of Present Illness:   David Mendoza is a 82 y.o. male with past medical history of DVT x 3 in the past no longer on anticoagulation, history of craniotomy for traumatic SDH in 2016, diabetes mellitus type 2, hypertension, hyperlipidemia presented to hospital with cough and cold for 1 week with progressive shortness of breath and increasing lower extremity edema.  In the ED, patient was tachypneic, blood pressure was elevated, he was on 5 to 6 L of oxygen by nasal cannula.  Labs showed leukocytosis of 17 K.  Creatinine was elevated at 2.2.  BNP 630.  D-dimer was 2.0.  COVID flu and RSV was negative.   CT chest without contrast shows mucosal thickening bilaterally with multifocal mucous plugging scattered airspace opacities bilaterally with consolidation in right upper and lower lobe and left lower lobe compatible with pneumonia with small bilateral pleural effusion and pericardial effusion with mediastinal lymphadenopathy. Patient was started on IV heparin gtt. as PE could not ruled out.  He was also started on broad-spectrum antibiotics and was admitted for further evaluation management.   Hospital Course:   Following  conditions were addressed during hospitalization as listed below,  Acute hypoxemic respiratory failure likely in the setting of multifocal pneumonia: Improved at this time clinical but has been requiring oxygen especially on mobility up to 4 L.  Afebrile.  Leukocytosis has resolved and has normalized to 9.4 from initial 15 K.  Lactic acid within normal limits.  Patient received Rocephin and Zithromax during hospitalization and will be discharged on Omnicef and Zithromax for next 3 days renally dosed to complete 5-day course.  Patient was initially on heparin drip empirically but lower extremity DVT ultrasound was negative.  Heparin drip was discontinued.   2D echocardiogram with LV ejection fraction of 60 to 65% with no regional wall motion abnormality.  This time patient will be continued on oxygen on discharge.  Will need to follow-up with his primary care physician as outpatient.   Bilateral lower extremity edema: Duplex ultrasound of the lower extremity negative for DVT.   Mild AKI on CKD stage IIIb: Creatinine today at 2.5.  Recommend outpatient monitoring with BMP.  Will discontinue Motrin on discharge.   Hypertension:  On Lasix losartan and Coreg from home.  Will be resumed on discharge.   Hyperlipidemia: Continue statins.   Depression/anxiety: Continue Zoloft   Grade 1 diastolic dysfunction: PAH: Noted on CT. Repeat 2D echocardiogram with LV ejection fraction of 60 to 65%.  Pulmonary hypertension reported.  On losartan, Lasix and Coreg as outpatient.   Aortic atherosclerosis/Ascending aorta aneurysm: Outpatient follow-up recommended.  Continue simvastatin.  GERD.  Continue omeprazole.   Type 2 diabetes with hyperglycemia:  Resume metformin and glipizide on discharge.  Hemoglobin A1c  was 8.5.   Disposition.  At this time, patient is stable for disposition home with outpatient PCP follow-up.  Spoke with the patient's daughter on the phone about disposition.  Medical Consultants:    None.  Procedures:    None Subjective:   Today, patient was seen and examined at bedside.  Continues to feel better but was little hypoxic on ambulation.  Seen by physical therapy recommended outpatient PT.  Has qualified for home oxygen but clinically improving.  Discharge Exam:   Vitals:   01/17/22 1214 01/17/22 1635  BP: (!) 185/84 (!) 159/77  Pulse: 70   Resp: (!) 22   Temp: 97.7 F (36.5 C)   SpO2: 95%    Vitals:   01/16/22 2026 01/17/22 0602 01/17/22 1214 01/17/22 1635  BP: (!) 167/77 (!) 167/80 (!) 185/84 (!) 159/77  Pulse: 72 69 70   Resp: 20 (!) 23 (!) 22   Temp: 97.8 F (36.6 C) 97.9 F (36.6 C) 97.7 F (36.5 C)   TempSrc:  Oral Oral   SpO2: 91% (!) 89% 95%   Weight:      Height:       General:  alert awake and Communicative, not in obvious distress HENT: pupils equally reacting to light,  No scleral pallor or icterus noted. Oral mucosa is moist.  Chest: .  Diminished breath sounds bilaterally.  CVS: S1 &S2 heard. No murmur.  Regular rate and rhythm. Abdomen: Soft, nontender, nondistended.  Bowel sounds are heard.   Extremities: No cyanosis, clubbing or edema.  Peripheral pulses are palpable. Psych: Alert, awake and oriented, normal mood CNS:  No cranial nerve deficits.  Power equal in all extremities.   Skin: Warm and dry.  No rashes noted.  The results of significant diagnostics from this hospitalization (including imaging, microbiology, ancillary and laboratory) are listed below for reference.     Diagnostic Studies:   ECHOCARDIOGRAM LIMITED  Result Date: 01/15/2022    ECHOCARDIOGRAM LIMITED REPORT   Patient Name:   David Mendoza Hourihan Date of Exam: 01/15/2022 Medical Rec #:  [id number]      Height:       70.0 in Accession #:    [id number]     Weight:       197.0 lb Date of Birth:  1940-11-23       BSA:          2.074 m Patient Age:    11 years       BP:           156/74 mmHg Patient Gender: M              HR:           88 bpm. Exam Location:  Inpatient  Procedure: Limited Echo, Color Doppler and Cardiac Doppler Indications:    Pulmonary embolus/PHTN  History:        Patient has prior history of Echocardiogram examinations, most                 recent 12/23/2021. Signs/Symptoms:Shortness of Breath, Dyspnea                 and Edema; Risk Factors:Hypertension, Diabetes and Dyslipidemia.                 Hx of DVT.  Sonographer:    Eartha Inch Referring Phys: [phone] JARED M GARDNER  Sonographer Comments: Image acquisition challenging due to patient body habitus and Image acquisition challenging due to respiratory motion. IMPRESSIONS  1. Left ventricular ejection fraction, by estimation, is 60 to 65%. The left ventricle has normal function. The left ventricle has no regional wall motion abnormalities. There is mild left ventricular hypertrophy.  2. Right ventricular systolic function is normal. The right ventricular size is normal. Tricuspid regurgitation signal is inadequate for assessing PA pressure.  3. Left atrial size was mild to moderately dilated.  4. A small pericardial effusion is present. There is no evidence of cardiac tamponade.  5. The mitral valve is grossly normal. Mild mitral valve regurgitation. No evidence of mitral stenosis.  6. The aortic valve is tricuspid. Aortic valve regurgitation is not visualized. No aortic stenosis is present.  7. The inferior vena cava is normal in size with greater than 50% respiratory variability, suggesting right atrial pressure of 3 mmHg. Comparison(s): No significant change from prior study. Conclusion(s)/Recommendation(s): RV/RA appear normal, no signficant TR to estimate pressures, IVC normal and collapses. FINDINGS  Left Ventricle: Left ventricular ejection fraction, by estimation, is 60 to 65%. The left ventricle has normal function. The left ventricle has no regional wall motion abnormalities. The left ventricular internal cavity size was normal in size. There is  mild left ventricular hypertrophy. Right  Ventricle: The right ventricular size is normal. No increase in right ventricular wall thickness. Right ventricular systolic function is normal. Tricuspid regurgitation signal is inadequate for assessing PA pressure. Left Atrium: Left atrial size was mild to moderately dilated. Right Atrium: Right atrial size was normal in size. Pericardium: A small pericardial effusion is present. There is no evidence of cardiac tamponade. Mitral Valve: The mitral valve is grossly normal. Mild mitral valve regurgitation. No evidence of mitral valve stenosis. Tricuspid Valve: The tricuspid valve is not well visualized. Tricuspid valve regurgitation is not demonstrated. No evidence of tricuspid stenosis. Aortic Valve: The aortic valve is tricuspid. Aortic valve regurgitation is not visualized. No aortic stenosis is present. Pulmonic Valve: The pulmonic valve was not well visualized. Pulmonic valve regurgitation is not visualized. Venous: The inferior vena cava is normal in size with greater than 50% respiratory variability, suggesting right atrial pressure of 3 mmHg. Additional Comments: Spectral Doppler performed. Color Doppler performed.  LEFT VENTRICLE PLAX 2D LVIDd:         4.80 cm LVIDs:         3.20 cm LV PW:         1.10 cm LV IVS:        1.00 cm  LV Volumes (MOD) LV vol d, MOD A2C: 118.0 ml LV vol d, MOD A4C: 141.0 ml LV vol s, MOD A2C: 43.9 ml LV vol s, MOD A4C: 48.8 ml LV SV MOD A2C:     74.1 ml LV SV MOD A4C:     141.0 ml LV SV MOD BP:      83.1 ml IVC IVC diam: 1.60 cm LEFT ATRIUM         Index LA diam:    3.60 cm 1.74 cm/m Buford Dresser MD Electronically signed by Buford Dresser MD Signature Date/Time: 01/15/2022/12:25:41 PM    Final    CT Chest Wo Contrast  Result Date: 01/15/2022 CLINICAL DATA:  Respiratory illness, nondiagnostic x-ray. Cough and cold like symptoms. Shortness of breath today. EXAM: CT CHEST WITHOUT CONTRAST TECHNIQUE: Multidetector CT imaging of the chest was performed following the  standard protocol without IV contrast. RADIATION DOSE REDUCTION: This exam was performed according to the departmental dose-optimization program which includes automated exposure control, adjustment of the mA and/or kV according to  patient size and/or use of iterative reconstruction technique. COMPARISON:  11/04/2021. FINDINGS: Cardiovascular: The heart is normal in size and there is a small to moderate pericardial effusion. There is atherosclerotic calcification of the aorta with mild aneurysmal dilatation of the ascending aorta measuring 4 cm. The pulmonary trunk is distended suggesting underlying pulmonary artery hypertension. Mediastinum/Nodes: Multiple enlarged lymph nodes are present in the mediastinum measuring up to 1.5 cm in the right paratracheal space a 1.6 cm in the subcarinal space. No axillary lymphadenopathy. Evaluation of the hila is limited due to lack of IV contrast. The thyroid gland and esophagus are within normal limits. Debris is noted in the mid trachea. Lungs/Pleura: There is a small right pleural effusion and trace left pleural effusion. Bronchial wall thickening is noted bilaterally with areas of mucous plugging. Scattered airspace opacities are noted in the lungs bilaterally. There is consolidation in the right upper and lower lobes and left lower lobe. No pneumothorax. Upper Abdomen: There is a cyst in the right upper quadrant, possible right renal cyst. No acute abnormality. Musculoskeletal: Anasarca is present. Old rib fractures on the right. Degenerative changes in the thoracic spine. No acute osseous abnormality. IMPRESSION: 1. Mucosal thickening bilaterally with multifocal mucous plugging, scattered airspace opacities bilaterally with consolidation in the right upper and lower lobes and left lower lobe, compatible with pneumonia. 2. Small right pleural effusion and trace left pleural effusion. 3. Small to moderate pericardial effusion. 4. Mediastinal lymphadenopathy, likely reactive.  5. Aortic atherosclerosis with mild aneurysmal dilatation of the ascending aorta measuring 4 cm. Recommend annual imaging followup by CTA or MRA. This recommendation follows 2010 ACCF/AHA/AATS/ACR/ASA/SCA/SCAI/SIR/STS/SVM Guidelines for the Diagnosis and Management of Patients with Thoracic Aortic Disease. Circulation. 2010; 121: N867-E720. Aortic aneurysm NOS (ICD10-I71.9) 6. Distended pulmonary trunk suggesting underlying pulmonary artery hypertension. 7. Anasarca. Electronically Signed   By: Brett Fairy M.D.   On: 01/15/2022 01:24   DG Chest Port 1 View  Result Date: 01/14/2022 CLINICAL DATA:  Chest pain. EXAM: PORTABLE CHEST 1 VIEW COMPARISON:  Chest radiograph dated November 28, 2021. FINDINGS: The heart is enlarged. Mild pulmonary vascular congestion. Bibasilar opacities, right greater than the left suggesting small effusions and/or atelectasis. Thoracic spondylosis. IMPRESSION: 1. Cardiomegaly with mild pulmonary vascular congestion. 2. Bibasilar opacities, right greater than left suggesting small effusions and/or atelectasis. Electronically Signed   By: Keane Police D.O.   On: 01/14/2022 23:49     Labs:   Basic Metabolic Panel: Recent Labs  Lab 01/14/22 2210 01/15/22 0531 01/16/22 0543 01/17/22 0511  NA 142 140 138 139  K 4.0 4.0 3.6 3.4*  CL 106 110 107 110  CO2 24 18* 21* 21*  GLUCOSE 231* 261* 219* 197*  BUN 29* 31* 44* 41*  CREATININE 2.23* 2.31* 2.57* 2.57*  CALCIUM 8.8* 8.3* 8.0* 7.8*  MG  --   --  1.8 1.8    GFR Estimated Creatinine Clearance: 25.4 mL/min (A) (by C-G formula based on SCr of 2.57 mg/dL (H)). Liver Function Tests: Recent Labs  Lab 01/15/22 0531  AST 18  ALT 20  ALKPHOS 113  BILITOT 0.9  PROT 7.4  ALBUMIN 2.5*    No results for input(s): "LIPASE", "AMYLASE" in the last 168 hours. No results for input(s): "AMMONIA" in the last 168 hours. Coagulation profile Recent Labs  Lab 01/14/22 2210  INR 1.1     CBC: Recent Labs  Lab  01/14/22 2210 01/15/22 0531 01/16/22 0543 01/17/22 0511  WBC 17.0* 15.1* 14.9* 9.4  HGB 11.0* 10.8*  9.5* 10.0*  HCT 33.5* 33.2* 29.3* 31.2*  MCV 94.4 95.7 94.8 95.1  PLT 270 270 283 277    Cardiac Enzymes: No results for input(s): "CKTOTAL", "CKMB", "CKMBINDEX", "TROPONINI" in the last 168 hours. BNP: Invalid input(s): "POCBNP" CBG: Recent Labs  Lab 01/16/22 1119 01/16/22 1712 01/16/22 2025 01/17/22 0735 01/17/22 1132  GLUCAP 172* 116* 166* 169* 190*    D-Dimer No results for input(s): "DDIMER" in the last 72 hours.  Hgb A1c No results for input(s): "HGBA1C" in the last 72 hours.  Lipid Profile No results for input(s): "CHOL", "HDL", "LDLCALC", "TRIG", "CHOLHDL", "LDLDIRECT" in the last 72 hours. Thyroid function studies No results for input(s): "TSH", "T4TOTAL", "T3FREE", "THYROIDAB" in the last 72 hours.  Invalid input(s): "FREET3" Anemia work up No results for input(s): "VITAMINB12", "FOLATE", "FERRITIN", "TIBC", "IRON", "RETICCTPCT" in the last 72 hours. Microbiology Recent Results (from the past 240 hour(s))  Resp panel by RT-PCR (RSV, Flu A&B, Covid) Anterior Nasal Swab     Status: None   Collection Time: 01/14/22 10:10 PM   Specimen: Anterior Nasal Swab  Result Value Ref Range Status   SARS Coronavirus 2 by RT PCR NEGATIVE NEGATIVE Final    Comment: (NOTE) SARS-CoV-2 target nucleic acids are NOT DETECTED.  The SARS-CoV-2 RNA is generally detectable in upper respiratory specimens during the acute phase of infection. The lowest concentration of SARS-CoV-2 viral copies this assay can detect is 138 copies/mL. A negative result does not preclude SARS-Cov-2 infection and should not be used as the sole basis for treatment or other patient management decisions. A negative result may occur with  improper specimen collection/handling, submission of specimen other than nasopharyngeal swab, presence of viral mutation(s) within the areas targeted by this assay,  and inadequate number of viral copies(<138 copies/mL). A negative result must be combined with clinical observations, patient history, and epidemiological information. The expected result is Negative.  Fact Sheet for Patients:  EntrepreneurPulse.com.au  Fact Sheet for Healthcare Providers:  IncredibleEmployment.be  This test is no t yet approved or cleared by the [place] FDA and  has been authorized for detection and/or diagnosis of SARS-CoV-2 by FDA under an Emergency Use Authorization (EUA). This EUA will remain  in effect (meaning this test can be used) for the duration of the COVID-19 declaration under Section 564(b)(1) of the Act, 21 U.S.C.section 360bbb-3(b)(1), unless the authorization is terminated  or revoked sooner.       Influenza A by PCR NEGATIVE NEGATIVE Final   Influenza B by PCR NEGATIVE NEGATIVE Final    Comment: (NOTE) The Xpert Xpress SARS-CoV-2/FLU/RSV plus assay is intended as an aid in the diagnosis of influenza from Nasopharyngeal swab specimens and should not be used as a sole basis for treatment. Nasal washings and aspirates are unacceptable for Xpert Xpress SARS-CoV-2/FLU/RSV testing.  Fact Sheet for Patients: EntrepreneurPulse.com.au  Fact Sheet for Healthcare Providers: IncredibleEmployment.be  This test is not yet approved or cleared by the [place] FDA and has been authorized for detection and/or diagnosis of SARS-CoV-2 by FDA under an Emergency Use Authorization (EUA). This EUA will remain in effect (meaning this test can be used) for the duration of the COVID-19 declaration under Section 564(b)(1) of the Act, 21 U.S.C. section 360bbb-3(b)(1), unless the authorization is terminated or revoked.     Resp Syncytial Virus by PCR NEGATIVE NEGATIVE Final    Comment: (NOTE) Fact Sheet for Patients: EntrepreneurPulse.com.au  Fact Sheet for  Healthcare Providers: IncredibleEmployment.be  This test is not yet approved or  cleared by the [place] and has been authorized for detection and/or diagnosis of SARS-CoV-2 by FDA under an Emergency Use Authorization (EUA). This EUA will remain in effect (meaning this test can be used) for the duration of the COVID-19 declaration under Section 564(b)(1) of the Act, 21 U.S.C. section 360bbb-3(b)(1), unless the authorization is terminated or revoked.  Performed at [facility], [address] [place], [place] [address]   Blood culture (routine x 2)     Status: None (Preliminary result)   Collection Time: 01/15/22 12:13 AM   Specimen: BLOOD  Result Value Ref Range Status   Specimen Description   Final    BLOOD BLOOD LEFT ARM Performed at [facility], [address] [place], [place] [address]    Special Requests   Final    BOTTLES DRAWN AEROBIC ONLY Blood Culture adequate volume Performed at [facility], [address] [place], [place] [address]    Culture   Final    NO GROWTH 2 DAYS Performed at [facility], [place] [address], [place], [place] [address]    Report Status PENDING  Incomplete  Blood culture (routine x 2)     Status: None (Preliminary result)   Collection Time: 01/15/22 12:16 AM   Specimen: BLOOD  Result Value Ref Range Status   Specimen Description   Final    BLOOD BLOOD RIGHT HAND Performed at [facility], [address], [place], [place] [address]    Special Requests   Final    BOTTLES DRAWN AEROBIC AND ANAEROBIC Blood Culture adequate volume Performed at [facility], [address] [place], [place] [address]    Culture   Final    NO GROWTH 2 DAYS Performed at [facility], [place] [address] [place], [place] [address]    Report Status PENDING  Incomplete  Respiratory (~20 pathogens) panel by PCR     Status: None    Collection Time: 01/15/22  4:25 AM   Specimen: Nasopharyngeal Swab; Respiratory  Result Value Ref Range Status   Adenovirus NOT DETECTED NOT DETECTED Final   Coronavirus 229E NOT DETECTED NOT DETECTED Final    Comment: (NOTE) The Coronavirus on the Respiratory Panel, DOES NOT test for the novel  Coronavirus (2019 nCoV)    Coronavirus HKU1 NOT DETECTED NOT DETECTED Final   Coronavirus NL63 NOT DETECTED NOT DETECTED Final   Coronavirus OC43 NOT DETECTED NOT DETECTED Final   Metapneumovirus NOT DETECTED NOT DETECTED Final   Rhinovirus / Enterovirus NOT DETECTED NOT DETECTED Final   Influenza A NOT DETECTED NOT DETECTED Final   Influenza B NOT DETECTED NOT DETECTED Final   Parainfluenza Virus 1 NOT DETECTED NOT DETECTED Final   Parainfluenza Virus 2 NOT DETECTED NOT DETECTED Final   Parainfluenza Virus 3 NOT DETECTED NOT DETECTED Final   Parainfluenza Virus 4 NOT DETECTED NOT DETECTED Final   Respiratory Syncytial Virus NOT DETECTED NOT DETECTED Final   Bordetella pertussis NOT DETECTED NOT DETECTED Final   Bordetella Parapertussis NOT DETECTED NOT DETECTED Final   Chlamydophila pneumoniae NOT DETECTED NOT DETECTED Final   Mycoplasma pneumoniae NOT DETECTED NOT DETECTED Final    Comment: Performed at [facility], [place]. [address], [place], [place] [address]     Discharge Instructions:   Discharge Instructions     Diet Carb Modified   Complete by: As directed    Discharge instructions   Complete by: As directed    Follow-up with your  primary care provider in 1 week.  Check blood work at that time.  Complete the course of antibiotic.  No overexertion.  Continue oxygen at home.  Continue physical therapy as outpatient.  Seek medical attention for worsening symptoms.   Increase activity slowly   Complete by: As directed       Allergies as of 01/17/2022       Reactions   Sulfa Antibiotics Diarrhea        Medication List     STOP taking these medications    ibuprofen  200 MG tablet Commonly known as: ADVIL   Pfizer COVID-19 Vac Bivalent injection Generic drug: COVID-19 mRNA bivalent vaccine Therapist, music)       TAKE these medications    acetaminophen 500 MG tablet Commonly known as: TYLENOL Take 1,000 mg by mouth every 6 (six) hours as needed for mild pain.   amLODipine 5 MG tablet Commonly known as: NORVASC Take 5 mg by mouth daily.   azithromycin 250 MG tablet Commonly known as: ZITHROMAX Take 1 tablet (250 mg total) by mouth daily for 3 days.   B-12 1000 MCG Tbcr Take 1 tablet by mouth daily.   carvedilol 25 MG tablet Commonly known as: COREG Take 25 mg by mouth 2 (two) times daily.   cefdinir 300 MG capsule Commonly known as: OMNICEF Take 1 capsule (300 mg total) by mouth daily for 3 days.   ferrous gluconate 324 MG tablet Commonly known as: FERGON Take 324 mg by mouth every other day.   folic acid 939 MCG tablet Commonly known as: FOLVITE Take 800 mcg by mouth daily.   furosemide 20 MG tablet Commonly known as: LASIX Take 20 mg by mouth every other day.   glipiZIDE 2.5 MG 24 hr tablet Commonly known as: GLUCOTROL XL Take 2.5 mg by mouth in the morning and at bedtime.   losartan 100 MG tablet Commonly known as: COZAAR Take 100 mg by mouth daily.   metFORMIN 500 MG tablet Commonly known as: GLUCOPHAGE Take 500 mg by mouth 2 (two) times daily with a meal.   omeprazole 40 MG capsule Commonly known as: PRILOSEC Take 40 mg by mouth daily.   OVER THE COUNTER MEDICATION Take 1 tablet by mouth daily. Juice Plus veggie and garden blend   sertraline 50 MG tablet Commonly known as: ZOLOFT Take 75 mg by mouth every morning.   simvastatin 40 MG tablet Commonly known as: ZOCOR Take 40 mg by mouth every evening.          Time coordinating discharge: 39 minutes  Signed:  Scotlyn Mccranie  [facility] 01/18/2022, 7:25 AM

## 2022-01-18 NOTE — Discharge Summary (Signed)
Physician Discharge Summary  David Mendoza FGH:[id number] DOB: [birth year] DOA: 01/14/2022  PCP: Kathalene Frames, MD  Admit date: 01/14/2022 Discharge date: 01/17/2022  Admitted From: Home  Discharge disposition: Home   Recommendations for Outpatient Follow-Up:   Follow up with your primary care provider in one week.  Check CBC, BMP, magnesium in the next visit  Discharge Diagnosis:   Principal Problem:   Acute respiratory failure with hypoxia (HCC) Active Problems:   Multifocal pneumonia   Stage 3b chronic kidney disease (CKD) (HCC)   DM2 (diabetes mellitus, type 2) (HCC)   HTN (hypertension)   Discharge Condition: Improved.  Diet recommendation: Low sodium, heart healthy.  Carbohydrate-modified.    Wound care: None.  Code status: Full.   History of Present Illness:   David Mendoza is a 82 y.o. male with past medical history of DVT x 3 in the past no longer on anticoagulation, history of craniotomy for traumatic SDH in 2016, diabetes mellitus type 2, hypertension, hyperlipidemia presented to hospital with cough and cold for 1 week with progressive shortness of breath and increasing lower extremity edema.  In the ED, patient was tachypneic, blood pressure was elevated, he was on 5 to 6 L of oxygen by nasal cannula.  Labs showed leukocytosis of 17 K.  Creatinine was elevated at 2.2.  BNP 630.  D-dimer was 2.0.  COVID flu and RSV was negative.   CT chest without contrast shows mucosal thickening bilaterally with multifocal mucous plugging scattered airspace opacities bilaterally with consolidation in right upper and lower lobe and left lower lobe compatible with pneumonia with small bilateral pleural effusion and pericardial effusion with mediastinal lymphadenopathy. Patient was started on IV heparin gtt. as PE could not ruled out.  He was also started on broad-spectrum antibiotics and was admitted for further evaluation management.   Hospital Course:   Following  conditions were addressed during hospitalization as listed below,  Acute hypoxemic respiratory failure likely in the setting of multifocal pneumonia: Improved at this time clinical but has been requiring oxygen especially on mobility up to 4 L.  Afebrile.  Leukocytosis has resolved and has normalized to 9.4 from initial 15 K.  Lactic acid within normal limits.  Patient received Rocephin and Zithromax during hospitalization and will be discharged on Omnicef and Zithromax for next 3 days renally dosed to complete 5-day course.  Patient was initially on heparin drip empirically but lower extremity DVT ultrasound was negative.  Heparin drip was discontinued.   2D echocardiogram with LV ejection fraction of 60 to 65% with no regional wall motion abnormality.  This time patient will be continued on oxygen on discharge.  Will need to follow-up with his primary care physician as outpatient.   Bilateral lower extremity edema: Duplex ultrasound of the lower extremity negative for DVT.   Mild AKI on CKD stage IIIb: Creatinine today at 2.5.  Recommend outpatient monitoring with BMP.  Will discontinue Motrin on discharge.   Hypertension:  On Lasix losartan and Coreg from home.  Will be resumed on discharge.   Hyperlipidemia: Continue statins.   Depression/anxiety: Continue Zoloft   Grade 1 diastolic dysfunction: PAH: Noted on CT. Repeat 2D echocardiogram with LV ejection fraction of 60 to 65%.  Pulmonary hypertension reported.  On losartan, Lasix and Coreg as outpatient.   Aortic atherosclerosis/Ascending aorta aneurysm: Outpatient follow-up recommended.  Continue simvastatin.  GERD.  Continue omeprazole.   Type 2 diabetes with hyperglycemia:  Resume metformin and glipizide on discharge.  Hemoglobin A1c  was 8.5.   Disposition.  At this time, patient is stable for disposition home with outpatient PCP follow-up.  Spoke with the patient's daughter on the phone about disposition.  Medical Consultants:    None.  Procedures:    None Subjective:   Today, patient was seen and examined at bedside.  Continues to feel better but was little hypoxic on ambulation.  Seen by physical therapy recommended outpatient PT.  Has qualified for home oxygen but clinically improving.  Discharge Exam:   Vitals:   01/17/22 1214 01/17/22 1635  BP: (!) 185/84 (!) 159/77  Pulse: 70   Resp: (!) 22   Temp: 97.7 F (36.5 C)   SpO2: 95%    Vitals:   01/16/22 2026 01/17/22 0602 01/17/22 1214 01/17/22 1635  BP: (!) 167/77 (!) 167/80 (!) 185/84 (!) 159/77  Pulse: 72 69 70   Resp: 20 (!) 23 (!) 22   Temp: 97.8 F (36.6 C) 97.9 F (36.6 C) 97.7 F (36.5 C)   TempSrc:  Oral Oral   SpO2: 91% (!) 89% 95%   Weight:      Height:       General:  alert awake and Communicative, not in obvious distress HENT: pupils equally reacting to light,  No scleral pallor or icterus noted. Oral mucosa is moist.  Chest: .  Diminished breath sounds bilaterally.  CVS: S1 &S2 heard. No murmur.  Regular rate and rhythm. Abdomen: Soft, nontender, nondistended.  Bowel sounds are heard.   Extremities: No cyanosis, clubbing or edema.  Peripheral pulses are palpable. Psych: Alert, awake and oriented, normal mood CNS:  No cranial nerve deficits.  Power equal in all extremities.   Skin: Warm and dry.  No rashes noted.  The results of significant diagnostics from this hospitalization (including imaging, microbiology, ancillary and laboratory) are listed below for reference.     Diagnostic Studies:   ECHOCARDIOGRAM LIMITED  Result Date: 01/15/2022    ECHOCARDIOGRAM LIMITED REPORT   Patient Name:   David Mendoza Leppo Date of Exam: 01/15/2022 Medical Rec #:  [id number]      Height:       70.0 in Accession #:    [id number]     Weight:       197.0 lb Date of Birth:  09-10-1940       BSA:          2.074 m Patient Age:    15 years       BP:           156/74 mmHg Patient Gender: M              HR:           88 bpm. Exam Location:  Inpatient  Procedure: Limited Echo, Color Doppler and Cardiac Doppler Indications:    Pulmonary embolus/PHTN  History:        Patient has prior history of Echocardiogram examinations, most                 recent 12/23/2021. Signs/Symptoms:Shortness of Breath, Dyspnea                 and Edema; Risk Factors:Hypertension, Diabetes and Dyslipidemia.                 Hx of DVT.  Sonographer:    Eartha Inch Referring Phys: [phone] JARED M GARDNER  Sonographer Comments: Image acquisition challenging due to patient body habitus and Image acquisition challenging due to respiratory motion. IMPRESSIONS  1. Left ventricular ejection fraction, by estimation, is 60 to 65%. The left ventricle has normal function. The left ventricle has no regional wall motion abnormalities. There is mild left ventricular hypertrophy.  2. Right ventricular systolic function is normal. The right ventricular size is normal. Tricuspid regurgitation signal is inadequate for assessing PA pressure.  3. Left atrial size was mild to moderately dilated.  4. A small pericardial effusion is present. There is no evidence of cardiac tamponade.  5. The mitral valve is grossly normal. Mild mitral valve regurgitation. No evidence of mitral stenosis.  6. The aortic valve is tricuspid. Aortic valve regurgitation is not visualized. No aortic stenosis is present.  7. The inferior vena cava is normal in size with greater than 50% respiratory variability, suggesting right atrial pressure of 3 mmHg. Comparison(s): No significant change from prior study. Conclusion(s)/Recommendation(s): RV/RA appear normal, no signficant TR to estimate pressures, IVC normal and collapses. FINDINGS  Left Ventricle: Left ventricular ejection fraction, by estimation, is 60 to 65%. The left ventricle has normal function. The left ventricle has no regional wall motion abnormalities. The left ventricular internal cavity size was normal in size. There is  mild left ventricular hypertrophy. Right  Ventricle: The right ventricular size is normal. No increase in right ventricular wall thickness. Right ventricular systolic function is normal. Tricuspid regurgitation signal is inadequate for assessing PA pressure. Left Atrium: Left atrial size was mild to moderately dilated. Right Atrium: Right atrial size was normal in size. Pericardium: A small pericardial effusion is present. There is no evidence of cardiac tamponade. Mitral Valve: The mitral valve is grossly normal. Mild mitral valve regurgitation. No evidence of mitral valve stenosis. Tricuspid Valve: The tricuspid valve is not well visualized. Tricuspid valve regurgitation is not demonstrated. No evidence of tricuspid stenosis. Aortic Valve: The aortic valve is tricuspid. Aortic valve regurgitation is not visualized. No aortic stenosis is present. Pulmonic Valve: The pulmonic valve was not well visualized. Pulmonic valve regurgitation is not visualized. Venous: The inferior vena cava is normal in size with greater than 50% respiratory variability, suggesting right atrial pressure of 3 mmHg. Additional Comments: Spectral Doppler performed. Color Doppler performed.  LEFT VENTRICLE PLAX 2D LVIDd:         4.80 cm LVIDs:         3.20 cm LV PW:         1.10 cm LV IVS:        1.00 cm  LV Volumes (MOD) LV vol d, MOD A2C: 118.0 ml LV vol d, MOD A4C: 141.0 ml LV vol s, MOD A2C: 43.9 ml LV vol s, MOD A4C: 48.8 ml LV SV MOD A2C:     74.1 ml LV SV MOD A4C:     141.0 ml LV SV MOD BP:      83.1 ml IVC IVC diam: 1.60 cm LEFT ATRIUM         Index LA diam:    3.60 cm 1.74 cm/m Buford Dresser MD Electronically signed by Buford Dresser MD Signature Date/Time: 01/15/2022/12:25:41 PM    Final    CT Chest Wo Contrast  Result Date: 01/15/2022 CLINICAL DATA:  Respiratory illness, nondiagnostic x-ray. Cough and cold like symptoms. Shortness of breath today. EXAM: CT CHEST WITHOUT CONTRAST TECHNIQUE: Multidetector CT imaging of the chest was performed following the  standard protocol without IV contrast. RADIATION DOSE REDUCTION: This exam was performed according to the departmental dose-optimization program which includes automated exposure control, adjustment of the mA and/or kV according to  patient size and/or use of iterative reconstruction technique. COMPARISON:  11/04/2021. FINDINGS: Cardiovascular: The heart is normal in size and there is a small to moderate pericardial effusion. There is atherosclerotic calcification of the aorta with mild aneurysmal dilatation of the ascending aorta measuring 4 cm. The pulmonary trunk is distended suggesting underlying pulmonary artery hypertension. Mediastinum/Nodes: Multiple enlarged lymph nodes are present in the mediastinum measuring up to 1.5 cm in the right paratracheal space a 1.6 cm in the subcarinal space. No axillary lymphadenopathy. Evaluation of the hila is limited due to lack of IV contrast. The thyroid gland and esophagus are within normal limits. Debris is noted in the mid trachea. Lungs/Pleura: There is a small right pleural effusion and trace left pleural effusion. Bronchial wall thickening is noted bilaterally with areas of mucous plugging. Scattered airspace opacities are noted in the lungs bilaterally. There is consolidation in the right upper and lower lobes and left lower lobe. No pneumothorax. Upper Abdomen: There is a cyst in the right upper quadrant, possible right renal cyst. No acute abnormality. Musculoskeletal: Anasarca is present. Old rib fractures on the right. Degenerative changes in the thoracic spine. No acute osseous abnormality. IMPRESSION: 1. Mucosal thickening bilaterally with multifocal mucous plugging, scattered airspace opacities bilaterally with consolidation in the right upper and lower lobes and left lower lobe, compatible with pneumonia. 2. Small right pleural effusion and trace left pleural effusion. 3. Small to moderate pericardial effusion. 4. Mediastinal lymphadenopathy, likely reactive.  5. Aortic atherosclerosis with mild aneurysmal dilatation of the ascending aorta measuring 4 cm. Recommend annual imaging followup by CTA or MRA. This recommendation follows 2010 ACCF/AHA/AATS/ACR/ASA/SCA/SCAI/SIR/STS/SVM Guidelines for the Diagnosis and Management of Patients with Thoracic Aortic Disease. Circulation. 2010; 121: G836-O294. Aortic aneurysm NOS (ICD10-I71.9) 6. Distended pulmonary trunk suggesting underlying pulmonary artery hypertension. 7. Anasarca. Electronically Signed   By: Brett Fairy M.D.   On: 01/15/2022 01:24   DG Chest Port 1 View  Result Date: 01/14/2022 CLINICAL DATA:  Chest pain. EXAM: PORTABLE CHEST 1 VIEW COMPARISON:  Chest radiograph dated November 28, 2021. FINDINGS: The heart is enlarged. Mild pulmonary vascular congestion. Bibasilar opacities, right greater than the left suggesting small effusions and/or atelectasis. Thoracic spondylosis. IMPRESSION: 1. Cardiomegaly with mild pulmonary vascular congestion. 2. Bibasilar opacities, right greater than left suggesting small effusions and/or atelectasis. Electronically Signed   By: Keane Police D.O.   On: 01/14/2022 23:49     Labs:   Basic Metabolic Panel: Recent Labs  Lab 01/14/22 2210 01/15/22 0531 01/16/22 0543 01/17/22 0511  NA 142 140 138 139  K 4.0 4.0 3.6 3.4*  CL 106 110 107 110  CO2 24 18* 21* 21*  GLUCOSE 231* 261* 219* 197*  BUN 29* 31* 44* 41*  CREATININE 2.23* 2.31* 2.57* 2.57*  CALCIUM 8.8* 8.3* 8.0* 7.8*  MG  --   --  1.8 1.8    GFR Estimated Creatinine Clearance: 25.4 mL/min (A) (by C-G formula based on SCr of 2.57 mg/dL (H)). Liver Function Tests: Recent Labs  Lab 01/15/22 0531  AST 18  ALT 20  ALKPHOS 113  BILITOT 0.9  PROT 7.4  ALBUMIN 2.5*    No results for input(s): "LIPASE", "AMYLASE" in the last 168 hours. No results for input(s): "AMMONIA" in the last 168 hours. Coagulation profile Recent Labs  Lab 01/14/22 2210  INR 1.1     CBC: Recent Labs  Lab  01/14/22 2210 01/15/22 0531 01/16/22 0543 01/17/22 0511  WBC 17.0* 15.1* 14.9* 9.4  HGB 11.0* 10.8*  9.5* 10.0*  HCT 33.5* 33.2* 29.3* 31.2*  MCV 94.4 95.7 94.8 95.1  PLT 270 270 283 277    Cardiac Enzymes: No results for input(s): "CKTOTAL", "CKMB", "CKMBINDEX", "TROPONINI" in the last 168 hours. BNP: Invalid input(s): "POCBNP" CBG: Recent Labs  Lab 01/16/22 1119 01/16/22 1712 01/16/22 2025 01/17/22 0735 01/17/22 1132  GLUCAP 172* 116* 166* 169* 190*    D-Dimer No results for input(s): "DDIMER" in the last 72 hours.  Hgb A1c No results for input(s): "HGBA1C" in the last 72 hours.  Lipid Profile No results for input(s): "CHOL", "HDL", "LDLCALC", "TRIG", "CHOLHDL", "LDLDIRECT" in the last 72 hours. Thyroid function studies No results for input(s): "TSH", "T4TOTAL", "T3FREE", "THYROIDAB" in the last 72 hours.  Invalid input(s): "FREET3" Anemia work up No results for input(s): "VITAMINB12", "FOLATE", "FERRITIN", "TIBC", "IRON", "RETICCTPCT" in the last 72 hours. Microbiology Recent Results (from the past 240 hour(s))  Resp panel by RT-PCR (RSV, Flu A&B, Covid) Anterior Nasal Swab     Status: None   Collection Time: 01/14/22 10:10 PM   Specimen: Anterior Nasal Swab  Result Value Ref Range Status   SARS Coronavirus 2 by RT PCR NEGATIVE NEGATIVE Final    Comment: (NOTE) SARS-CoV-2 target nucleic acids are NOT DETECTED.  The SARS-CoV-2 RNA is generally detectable in upper respiratory specimens during the acute phase of infection. The lowest concentration of SARS-CoV-2 viral copies this assay can detect is 138 copies/mL. A negative result does not preclude SARS-Cov-2 infection and should not be used as the sole basis for treatment or other patient management decisions. A negative result may occur with  improper specimen collection/handling, submission of specimen other than nasopharyngeal swab, presence of viral mutation(s) within the areas targeted by this assay,  and inadequate number of viral copies(<138 copies/mL). A negative result must be combined with clinical observations, patient history, and epidemiological information. The expected result is Negative.  Fact Sheet for Patients:  EntrepreneurPulse.com.au  Fact Sheet for Healthcare Providers:  IncredibleEmployment.be  This test is no t yet approved or cleared by the [place] FDA and  has been authorized for detection and/or diagnosis of SARS-CoV-2 by FDA under an Emergency Use Authorization (EUA). This EUA will remain  in effect (meaning this test can be used) for the duration of the COVID-19 declaration under Section 564(b)(1) of the Act, 21 U.S.C.section 360bbb-3(b)(1), unless the authorization is terminated  or revoked sooner.       Influenza A by PCR NEGATIVE NEGATIVE Final   Influenza B by PCR NEGATIVE NEGATIVE Final    Comment: (NOTE) The Xpert Xpress SARS-CoV-2/FLU/RSV plus assay is intended as an aid in the diagnosis of influenza from Nasopharyngeal swab specimens and should not be used as a sole basis for treatment. Nasal washings and aspirates are unacceptable for Xpert Xpress SARS-CoV-2/FLU/RSV testing.  Fact Sheet for Patients: EntrepreneurPulse.com.au  Fact Sheet for Healthcare Providers: IncredibleEmployment.be  This test is not yet approved or cleared by the [place] FDA and has been authorized for detection and/or diagnosis of SARS-CoV-2 by FDA under an Emergency Use Authorization (EUA). This EUA will remain in effect (meaning this test can be used) for the duration of the COVID-19 declaration under Section 564(b)(1) of the Act, 21 U.S.C. section 360bbb-3(b)(1), unless the authorization is terminated or revoked.     Resp Syncytial Virus by PCR NEGATIVE NEGATIVE Final    Comment: (NOTE) Fact Sheet for Patients: EntrepreneurPulse.com.au  Fact Sheet for  Healthcare Providers: IncredibleEmployment.be  This test is not yet approved or  cleared by the [place] and has been authorized for detection and/or diagnosis of SARS-CoV-2 by FDA under an Emergency Use Authorization (EUA). This EUA will remain in effect (meaning this test can be used) for the duration of the COVID-19 declaration under Section 564(b)(1) of the Act, 21 U.S.C. section 360bbb-3(b)(1), unless the authorization is terminated or revoked.  Performed at [facility], [address], [place], [place] [address]   Blood culture (routine x 2)     Status: None (Preliminary result)   Collection Time: 01/15/22 12:13 AM   Specimen: BLOOD  Result Value Ref Range Status   Specimen Description   Final    BLOOD BLOOD LEFT ARM Performed at [facility], [address], [place], [place] [address]    Special Requests   Final    BOTTLES DRAWN AEROBIC ONLY Blood Culture adequate volume Performed at [facility], [address] [place], [place] [address]    Culture   Final    NO GROWTH 2 DAYS Performed at [facility], [place] [address], [place], [place] [address]    Report Status PENDING  Incomplete  Blood culture (routine x 2)     Status: None (Preliminary result)   Collection Time: 01/15/22 12:16 AM   Specimen: BLOOD  Result Value Ref Range Status   Specimen Description   Final    BLOOD BLOOD RIGHT HAND Performed at [facility], [address] [place], [place] [address]    Special Requests   Final    BOTTLES DRAWN AEROBIC AND ANAEROBIC Blood Culture adequate volume Performed at [facility], [address], [place], [place] [address]    Culture   Final    NO GROWTH 2 DAYS Performed at [facility], [place] [address], [place], [place] [address]    Report Status PENDING  Incomplete  Respiratory (~20 pathogens) panel by PCR     Status: None    Collection Time: 01/15/22  4:25 AM   Specimen: Nasopharyngeal Swab; Respiratory  Result Value Ref Range Status   Adenovirus NOT DETECTED NOT DETECTED Final   Coronavirus 229E NOT DETECTED NOT DETECTED Final    Comment: (NOTE) The Coronavirus on the Respiratory Panel, DOES NOT test for the novel  Coronavirus (2019 nCoV)    Coronavirus HKU1 NOT DETECTED NOT DETECTED Final   Coronavirus NL63 NOT DETECTED NOT DETECTED Final   Coronavirus OC43 NOT DETECTED NOT DETECTED Final   Metapneumovirus NOT DETECTED NOT DETECTED Final   Rhinovirus / Enterovirus NOT DETECTED NOT DETECTED Final   Influenza A NOT DETECTED NOT DETECTED Final   Influenza B NOT DETECTED NOT DETECTED Final   Parainfluenza Virus 1 NOT DETECTED NOT DETECTED Final   Parainfluenza Virus 2 NOT DETECTED NOT DETECTED Final   Parainfluenza Virus 3 NOT DETECTED NOT DETECTED Final   Parainfluenza Virus 4 NOT DETECTED NOT DETECTED Final   Respiratory Syncytial Virus NOT DETECTED NOT DETECTED Final   Bordetella pertussis NOT DETECTED NOT DETECTED Final   Bordetella Parapertussis NOT DETECTED NOT DETECTED Final   Chlamydophila pneumoniae NOT DETECTED NOT DETECTED Final   Mycoplasma pneumoniae NOT DETECTED NOT DETECTED Final    Comment: Performed at [facility], [place]. [address] [place],  [address]     Discharge Instructions:   Discharge Instructions     Diet Carb Modified   Complete by: As directed    Discharge instructions   Complete by: As directed    Follow-up with your  primary care provider in 1 week.  Check blood work at that time.  Complete the course of antibiotic.  No overexertion.  Continue oxygen at home.  Continue physical therapy as outpatient.  Seek medical attention for worsening symptoms.   Increase activity slowly   Complete by: As directed       Allergies as of 01/17/2022       Reactions   Sulfa Antibiotics Diarrhea        Medication List     STOP taking these medications    ibuprofen  200 MG tablet Commonly known as: ADVIL   Pfizer COVID-19 Vac Bivalent injection Generic drug: COVID-19 mRNA bivalent vaccine Therapist, music)       TAKE these medications    acetaminophen 500 MG tablet Commonly known as: TYLENOL Take 1,000 mg by mouth every 6 (six) hours as needed for mild pain.   amLODipine 5 MG tablet Commonly known as: NORVASC Take 5 mg by mouth daily.   azithromycin 250 MG tablet Commonly known as: ZITHROMAX Take 1 tablet (250 mg total) by mouth daily for 3 days.   B-12 1000 MCG Tbcr Take 1 tablet by mouth daily.   carvedilol 25 MG tablet Commonly known as: COREG Take 25 mg by mouth 2 (two) times daily.   cefdinir 300 MG capsule Commonly known as: OMNICEF Take 1 capsule (300 mg total) by mouth daily for 3 days.   ferrous gluconate 324 MG tablet Commonly known as: FERGON Take 324 mg by mouth every other day.   folic acid 588 MCG tablet Commonly known as: FOLVITE Take 800 mcg by mouth daily.   furosemide 20 MG tablet Commonly known as: LASIX Take 20 mg by mouth every other day.   glipiZIDE 2.5 MG 24 hr tablet Commonly known as: GLUCOTROL XL Take 2.5 mg by mouth in the morning and at bedtime.   losartan 100 MG tablet Commonly known as: COZAAR Take 100 mg by mouth daily.   metFORMIN 500 MG tablet Commonly known as: GLUCOPHAGE Take 500 mg by mouth 2 (two) times daily with a meal.   omeprazole 40 MG capsule Commonly known as: PRILOSEC Take 40 mg by mouth daily.   OVER THE COUNTER MEDICATION Take 1 tablet by mouth daily. Juice Plus veggie and garden blend   sertraline 50 MG tablet Commonly known as: ZOLOFT Take 75 mg by mouth every morning.   simvastatin 40 MG tablet Commonly known as: ZOCOR Take 40 mg by mouth every evening.          Time coordinating discharge: 39 minutes  Signed:  Latron Ribas  [facility] 01/18/2022, 7:26 AM

## 2022-01-20 LAB — CULTURE, BLOOD (ROUTINE X 2)
Culture: NO GROWTH
Culture: NO GROWTH
Special Requests: ADEQUATE
Special Requests: ADEQUATE

## 2022-01-27 DIAGNOSIS — E1165 Type 2 diabetes mellitus with hyperglycemia: Secondary | ICD-10-CM | POA: Diagnosis not present

## 2022-01-27 DIAGNOSIS — E1140 Type 2 diabetes mellitus with diabetic neuropathy, unspecified: Secondary | ICD-10-CM | POA: Diagnosis not present

## 2022-01-27 DIAGNOSIS — E1122 Type 2 diabetes mellitus with diabetic chronic kidney disease: Secondary | ICD-10-CM | POA: Diagnosis not present

## 2022-01-27 DIAGNOSIS — N184 Chronic kidney disease, stage 4 (severe): Secondary | ICD-10-CM | POA: Diagnosis not present

## 2022-02-03 DIAGNOSIS — E782 Mixed hyperlipidemia: Secondary | ICD-10-CM | POA: Diagnosis not present

## 2022-02-03 DIAGNOSIS — I10 Essential (primary) hypertension: Secondary | ICD-10-CM | POA: Diagnosis not present

## 2022-02-03 DIAGNOSIS — E1165 Type 2 diabetes mellitus with hyperglycemia: Secondary | ICD-10-CM | POA: Diagnosis not present

## 2022-02-03 DIAGNOSIS — K219 Gastro-esophageal reflux disease without esophagitis: Secondary | ICD-10-CM | POA: Diagnosis not present

## 2022-02-08 ENCOUNTER — Ambulatory Visit
Admission: RE | Admit: 2022-02-08 | Discharge: 2022-02-08 | Disposition: A | Discharge status: Home / Self Care | Payer: Medicare Other | Source: Ambulatory Visit | Attending: Internal Medicine | Admitting: Internal Medicine

## 2022-02-08 ENCOUNTER — Other Ambulatory Visit: Payer: Self-pay | Admitting: Internal Medicine

## 2022-02-08 DIAGNOSIS — J189 Pneumonia, unspecified organism: Secondary | ICD-10-CM

## 2022-02-08 DIAGNOSIS — I719 Aortic aneurysm of unspecified site, without rupture: Secondary | ICD-10-CM | POA: Diagnosis not present

## 2022-02-08 DIAGNOSIS — R059 Cough, unspecified: Secondary | ICD-10-CM | POA: Diagnosis not present

## 2022-02-08 DIAGNOSIS — U071 COVID-19: Secondary | ICD-10-CM | POA: Diagnosis not present

## 2022-02-08 DIAGNOSIS — N1832 Chronic kidney disease, stage 3b: Secondary | ICD-10-CM | POA: Diagnosis not present

## 2022-02-08 DIAGNOSIS — R918 Other nonspecific abnormal finding of lung field: Secondary | ICD-10-CM | POA: Diagnosis not present

## 2022-02-13 DIAGNOSIS — E1165 Type 2 diabetes mellitus with hyperglycemia: Secondary | ICD-10-CM | POA: Diagnosis not present

## 2022-02-13 DIAGNOSIS — U071 COVID-19: Secondary | ICD-10-CM | POA: Diagnosis not present

## 2022-02-13 DIAGNOSIS — J40 Bronchitis, not specified as acute or chronic: Secondary | ICD-10-CM | POA: Diagnosis not present

## 2022-02-13 DIAGNOSIS — E1140 Type 2 diabetes mellitus with diabetic neuropathy, unspecified: Secondary | ICD-10-CM | POA: Diagnosis not present

## 2022-02-13 DIAGNOSIS — E1122 Type 2 diabetes mellitus with diabetic chronic kidney disease: Secondary | ICD-10-CM | POA: Diagnosis not present

## 2022-03-03 ENCOUNTER — Ambulatory Visit
Admission: RE | Admit: 2022-03-03 | Discharge: 2022-03-03 | Disposition: A | Discharge status: Home / Self Care | Payer: Medicare Other | Source: Ambulatory Visit | Attending: Internal Medicine | Admitting: Internal Medicine

## 2022-03-03 ENCOUNTER — Other Ambulatory Visit: Payer: Self-pay | Admitting: Internal Medicine

## 2022-03-03 DIAGNOSIS — E1122 Type 2 diabetes mellitus with diabetic chronic kidney disease: Secondary | ICD-10-CM | POA: Diagnosis not present

## 2022-03-03 DIAGNOSIS — R9389 Abnormal findings on diagnostic imaging of other specified body structures: Secondary | ICD-10-CM | POA: Diagnosis not present

## 2022-03-03 DIAGNOSIS — N184 Chronic kidney disease, stage 4 (severe): Secondary | ICD-10-CM | POA: Diagnosis not present

## 2022-03-03 DIAGNOSIS — J9811 Atelectasis: Secondary | ICD-10-CM | POA: Diagnosis not present

## 2022-03-03 DIAGNOSIS — J90 Pleural effusion, not elsewhere classified: Secondary | ICD-10-CM | POA: Diagnosis not present

## 2022-03-03 DIAGNOSIS — R059 Cough, unspecified: Secondary | ICD-10-CM | POA: Diagnosis not present

## 2022-03-03 DIAGNOSIS — E1165 Type 2 diabetes mellitus with hyperglycemia: Secondary | ICD-10-CM | POA: Diagnosis not present

## 2022-03-03 DIAGNOSIS — R600 Localized edema: Secondary | ICD-10-CM | POA: Diagnosis not present

## 2022-03-03 DIAGNOSIS — R918 Other nonspecific abnormal finding of lung field: Secondary | ICD-10-CM | POA: Diagnosis not present

## 2022-03-06 DIAGNOSIS — E782 Mixed hyperlipidemia: Secondary | ICD-10-CM | POA: Diagnosis not present

## 2022-03-06 DIAGNOSIS — E11319 Type 2 diabetes mellitus with unspecified diabetic retinopathy without macular edema: Secondary | ICD-10-CM | POA: Diagnosis not present

## 2022-03-06 DIAGNOSIS — I10 Essential (primary) hypertension: Secondary | ICD-10-CM | POA: Diagnosis not present

## 2022-03-06 DIAGNOSIS — K219 Gastro-esophageal reflux disease without esophagitis: Secondary | ICD-10-CM | POA: Diagnosis not present

## 2022-03-09 ENCOUNTER — Inpatient Hospital Stay (HOSPITAL_COMMUNITY)
Admission: EM | Admit: 2022-03-09 | Discharge: 2022-03-20 | DRG: 082 | Disposition: A | Discharge status: Hospice | Payer: Medicare Other | Attending: Surgery | Admitting: Surgery

## 2022-03-09 ENCOUNTER — Encounter (HOSPITAL_COMMUNITY): Payer: Self-pay | Admitting: General Surgery

## 2022-03-09 ENCOUNTER — Emergency Department (HOSPITAL_COMMUNITY): Payer: Medicare Other

## 2022-03-09 DIAGNOSIS — S069XAA Unspecified intracranial injury with loss of consciousness status unknown, initial encounter: Secondary | ICD-10-CM | POA: Diagnosis present

## 2022-03-09 DIAGNOSIS — Z7984 Long term (current) use of oral hypoglycemic drugs: Secondary | ICD-10-CM

## 2022-03-09 DIAGNOSIS — R4182 Altered mental status, unspecified: Secondary | ICD-10-CM | POA: Diagnosis not present

## 2022-03-09 DIAGNOSIS — S069X9A Unspecified intracranial injury with loss of consciousness of unspecified duration, initial encounter: Secondary | ICD-10-CM | POA: Diagnosis not present

## 2022-03-09 DIAGNOSIS — E1140 Type 2 diabetes mellitus with diabetic neuropathy, unspecified: Secondary | ICD-10-CM | POA: Diagnosis present

## 2022-03-09 DIAGNOSIS — K8689 Other specified diseases of pancreas: Secondary | ICD-10-CM | POA: Diagnosis not present

## 2022-03-09 DIAGNOSIS — R569 Unspecified convulsions: Secondary | ICD-10-CM | POA: Diagnosis present

## 2022-03-09 DIAGNOSIS — Z7189 Other specified counseling: Secondary | ICD-10-CM | POA: Diagnosis not present

## 2022-03-09 DIAGNOSIS — E1122 Type 2 diabetes mellitus with diabetic chronic kidney disease: Secondary | ICD-10-CM | POA: Diagnosis not present

## 2022-03-09 DIAGNOSIS — Z66 Do not resuscitate: Secondary | ICD-10-CM | POA: Diagnosis not present

## 2022-03-09 DIAGNOSIS — Z79899 Other long term (current) drug therapy: Secondary | ICD-10-CM

## 2022-03-09 DIAGNOSIS — Z515 Encounter for palliative care: Secondary | ICD-10-CM

## 2022-03-09 DIAGNOSIS — S0003XA Contusion of scalp, initial encounter: Secondary | ICD-10-CM | POA: Diagnosis not present

## 2022-03-09 DIAGNOSIS — Z87891 Personal history of nicotine dependence: Secondary | ICD-10-CM | POA: Diagnosis not present

## 2022-03-09 DIAGNOSIS — Z7401 Bed confinement status: Secondary | ICD-10-CM | POA: Diagnosis not present

## 2022-03-09 DIAGNOSIS — J9811 Atelectasis: Secondary | ICD-10-CM | POA: Diagnosis not present

## 2022-03-09 DIAGNOSIS — Z9181 History of falling: Secondary | ICD-10-CM

## 2022-03-09 DIAGNOSIS — J90 Pleural effusion, not elsewhere classified: Secondary | ICD-10-CM | POA: Diagnosis not present

## 2022-03-09 DIAGNOSIS — J9690 Respiratory failure, unspecified, unspecified whether with hypoxia or hypercapnia: Secondary | ICD-10-CM | POA: Diagnosis not present

## 2022-03-09 DIAGNOSIS — Z4682 Encounter for fitting and adjustment of non-vascular catheter: Secondary | ICD-10-CM | POA: Diagnosis not present

## 2022-03-09 DIAGNOSIS — J9601 Acute respiratory failure with hypoxia: Secondary | ICD-10-CM | POA: Diagnosis present

## 2022-03-09 DIAGNOSIS — N200 Calculus of kidney: Secondary | ICD-10-CM | POA: Diagnosis not present

## 2022-03-09 DIAGNOSIS — S0101XA Laceration without foreign body of scalp, initial encounter: Secondary | ICD-10-CM | POA: Diagnosis not present

## 2022-03-09 DIAGNOSIS — S061XAA Traumatic cerebral edema with loss of consciousness status unknown, initial encounter: Secondary | ICD-10-CM | POA: Diagnosis present

## 2022-03-09 DIAGNOSIS — I6302 Cerebral infarction due to thrombosis of basilar artery: Secondary | ICD-10-CM | POA: Diagnosis not present

## 2022-03-09 DIAGNOSIS — R401 Stupor: Secondary | ICD-10-CM | POA: Diagnosis not present

## 2022-03-09 DIAGNOSIS — S065XAA Traumatic subdural hemorrhage with loss of consciousness status unknown, initial encounter: Principal | ICD-10-CM | POA: Diagnosis present

## 2022-03-09 DIAGNOSIS — R402112 Coma scale, eyes open, never, at arrival to emergency department: Secondary | ICD-10-CM | POA: Diagnosis not present

## 2022-03-09 DIAGNOSIS — Z8673 Personal history of transient ischemic attack (TIA), and cerebral infarction without residual deficits: Secondary | ICD-10-CM

## 2022-03-09 DIAGNOSIS — R6889 Other general symptoms and signs: Secondary | ICD-10-CM | POA: Diagnosis not present

## 2022-03-09 DIAGNOSIS — E113313 Type 2 diabetes mellitus with moderate nonproliferative diabetic retinopathy with macular edema, bilateral: Secondary | ICD-10-CM | POA: Diagnosis not present

## 2022-03-09 DIAGNOSIS — S0636AA Traumatic hemorrhage of cerebrum, unspecified, with loss of consciousness status unknown, initial encounter: Secondary | ICD-10-CM | POA: Diagnosis present

## 2022-03-09 DIAGNOSIS — E875 Hyperkalemia: Secondary | ICD-10-CM | POA: Diagnosis not present

## 2022-03-09 DIAGNOSIS — I6200 Nontraumatic subdural hemorrhage, unspecified: Secondary | ICD-10-CM | POA: Diagnosis not present

## 2022-03-09 DIAGNOSIS — K5730 Diverticulosis of large intestine without perforation or abscess without bleeding: Secondary | ICD-10-CM | POA: Diagnosis not present

## 2022-03-09 DIAGNOSIS — R1310 Dysphagia, unspecified: Secondary | ICD-10-CM | POA: Diagnosis present

## 2022-03-09 DIAGNOSIS — Z882 Allergy status to sulfonamides status: Secondary | ICD-10-CM

## 2022-03-09 DIAGNOSIS — N184 Chronic kidney disease, stage 4 (severe): Secondary | ICD-10-CM | POA: Diagnosis present

## 2022-03-09 DIAGNOSIS — Z86718 Personal history of other venous thrombosis and embolism: Secondary | ICD-10-CM

## 2022-03-09 DIAGNOSIS — W19XXXA Unspecified fall, initial encounter: Secondary | ICD-10-CM | POA: Diagnosis not present

## 2022-03-09 DIAGNOSIS — S06360A Traumatic hemorrhage of cerebrum, unspecified, without loss of consciousness, initial encounter: Secondary | ICD-10-CM | POA: Diagnosis not present

## 2022-03-09 DIAGNOSIS — I10 Essential (primary) hypertension: Secondary | ICD-10-CM | POA: Diagnosis not present

## 2022-03-09 DIAGNOSIS — E785 Hyperlipidemia, unspecified: Secondary | ICD-10-CM | POA: Diagnosis not present

## 2022-03-09 DIAGNOSIS — G928 Other toxic encephalopathy: Secondary | ICD-10-CM | POA: Diagnosis present

## 2022-03-09 DIAGNOSIS — G319 Degenerative disease of nervous system, unspecified: Secondary | ICD-10-CM | POA: Diagnosis not present

## 2022-03-09 DIAGNOSIS — Y9301 Activity, walking, marching and hiking: Secondary | ICD-10-CM | POA: Diagnosis present

## 2022-03-09 DIAGNOSIS — I129 Hypertensive chronic kidney disease with stage 1 through stage 4 chronic kidney disease, or unspecified chronic kidney disease: Secondary | ICD-10-CM | POA: Diagnosis not present

## 2022-03-09 DIAGNOSIS — R29810 Facial weakness: Secondary | ICD-10-CM | POA: Diagnosis present

## 2022-03-09 DIAGNOSIS — F05 Delirium due to known physiological condition: Secondary | ICD-10-CM | POA: Diagnosis not present

## 2022-03-09 DIAGNOSIS — Z743 Need for continuous supervision: Secondary | ICD-10-CM | POA: Diagnosis not present

## 2022-03-09 DIAGNOSIS — W1830XA Fall on same level, unspecified, initial encounter: Secondary | ICD-10-CM | POA: Diagnosis present

## 2022-03-09 DIAGNOSIS — R402322 Coma scale, best motor response, extension, at arrival to emergency department: Secondary | ICD-10-CM | POA: Diagnosis present

## 2022-03-09 DIAGNOSIS — R404 Transient alteration of awareness: Secondary | ICD-10-CM | POA: Diagnosis not present

## 2022-03-09 DIAGNOSIS — E119 Type 2 diabetes mellitus without complications: Secondary | ICD-10-CM | POA: Diagnosis not present

## 2022-03-09 DIAGNOSIS — I639 Cerebral infarction, unspecified: Secondary | ICD-10-CM | POA: Diagnosis not present

## 2022-03-09 DIAGNOSIS — S065X0A Traumatic subdural hemorrhage without loss of consciousness, initial encounter: Secondary | ICD-10-CM | POA: Diagnosis not present

## 2022-03-09 DIAGNOSIS — E876 Hypokalemia: Secondary | ICD-10-CM | POA: Diagnosis present

## 2022-03-09 DIAGNOSIS — T1490XA Injury, unspecified, initial encounter: Secondary | ICD-10-CM | POA: Diagnosis not present

## 2022-03-09 DIAGNOSIS — N179 Acute kidney failure, unspecified: Secondary | ICD-10-CM | POA: Diagnosis not present

## 2022-03-09 DIAGNOSIS — R402232 Coma scale, best verbal response, inappropriate words, at arrival to emergency department: Secondary | ICD-10-CM | POA: Diagnosis present

## 2022-03-09 DIAGNOSIS — E162 Hypoglycemia, unspecified: Secondary | ICD-10-CM | POA: Diagnosis not present

## 2022-03-09 LAB — SAMPLE TO BLOOD BANK

## 2022-03-09 LAB — I-STAT CHEM 8, ED
BUN: 34 mg/dL — ABNORMAL HIGH (ref 8–23)
Calcium, Ion: 0.96 mmol/L — ABNORMAL LOW (ref 1.15–1.40)
Chloride: 109 mmol/L (ref 98–111)
Creatinine, Ser: 2.3 mg/dL — ABNORMAL HIGH (ref 0.61–1.24)
Glucose, Bld: 198 mg/dL — ABNORMAL HIGH (ref 70–99)
HCT: 25 % — ABNORMAL LOW (ref 39.0–52.0)
Hemoglobin: 8.5 g/dL — ABNORMAL LOW (ref 13.0–17.0)
Potassium: 4.6 mmol/L (ref 3.5–5.1)
Sodium: 139 mmol/L (ref 135–145)
TCO2: 23 mmol/L (ref 22–32)

## 2022-03-09 LAB — I-STAT ARTERIAL BLOOD GAS, ED
Acid-base deficit: 4 mmol/L — ABNORMAL HIGH (ref 0.0–2.0)
Bicarbonate: 21.7 mmol/L (ref 20.0–28.0)
Calcium, Ion: 1.16 mmol/L (ref 1.15–1.40)
HCT: 22 % — ABNORMAL LOW (ref 39.0–52.0)
Hemoglobin: 7.5 g/dL — ABNORMAL LOW (ref 13.0–17.0)
O2 Saturation: 100 %
Potassium: 3 mmol/L — ABNORMAL LOW (ref 3.5–5.1)
Sodium: 141 mmol/L (ref 135–145)
TCO2: 23 mmol/L (ref 22–32)
pCO2 arterial: 39 mmHg (ref 32–48)
pH, Arterial: 7.353 (ref 7.35–7.45)
pO2, Arterial: 509 mmHg — ABNORMAL HIGH (ref 83–108)

## 2022-03-09 LAB — GLUCOSE, CAPILLARY
Glucose-Capillary: 183 mg/dL — ABNORMAL HIGH (ref 70–99)
Glucose-Capillary: 124 mg/dL — ABNORMAL HIGH (ref 70–99)

## 2022-03-09 LAB — URINALYSIS, ROUTINE W REFLEX MICROSCOPIC
Bilirubin Urine: NEGATIVE
Glucose, UA: >=500 mg/dL — AB
Ketones, ur: NEGATIVE mg/dL
Leukocytes,Ua: NEGATIVE
Nitrite: NEGATIVE
Protein, ur: >300 mg/dL — AB
Specific Gravity, Urine: 1.02 (ref 1.005–1.030)
pH: 6 (ref 5.0–8.0)

## 2022-03-09 LAB — COMPREHENSIVE METABOLIC PANEL
ALT: 13 U/L (ref 0–44)
AST: 12 U/L — ABNORMAL LOW (ref 15–41)
Albumin: <1.5 g/dL — ABNORMAL LOW (ref 3.5–5.0)
Alkaline Phosphatase: 58 U/L (ref 38–126)
Anion gap: 4 — ABNORMAL LOW (ref 5–15)
BUN: 17 mg/dL (ref 8–23)
CO2: 14 mmol/L — ABNORMAL LOW (ref 22–32)
Calcium: 4.8 mg/dL — CL (ref 8.9–10.3)
Chloride: 123 mmol/L — ABNORMAL HIGH (ref 98–111)
Creatinine, Ser: 1.37 mg/dL — ABNORMAL HIGH (ref 0.61–1.24)
GFR, Estimated: 52 mL/min — ABNORMAL LOW (ref >60)
Glucose, Bld: 134 mg/dL — ABNORMAL HIGH (ref 70–99)
Potassium: 2 mmol/L — CL (ref 3.5–5.1)
Sodium: 141 mmol/L (ref 135–145)
Total Bilirubin: 0.4 mg/dL (ref 0.3–1.2)
Total Protein: <3 g/dL — ABNORMAL LOW (ref 6.5–8.1)

## 2022-03-09 LAB — URINALYSIS, MICROSCOPIC (REFLEX): Bacteria, UA: NONE SEEN

## 2022-03-09 LAB — CBC
HCT: 28.7 % — ABNORMAL LOW (ref 39.0–52.0)
Hemoglobin: 9.4 g/dL — ABNORMAL LOW (ref 13.0–17.0)
MCH: 31.8 pg (ref 26.0–34.0)
MCHC: 32.8 g/dL (ref 30.0–36.0)
MCV: 97 fL (ref 80.0–100.0)
Platelets: 220 10*3/uL (ref 150–400)
RBC: 2.96 MIL/uL — ABNORMAL LOW (ref 4.22–5.81)
RDW: 14 % (ref 11.5–15.5)
WBC: 6.6 10*3/uL (ref 4.0–10.5)
nRBC: 0 % (ref 0.0–0.2)

## 2022-03-09 LAB — MRSA NEXT GEN BY PCR, NASAL: MRSA by PCR Next Gen: NOT DETECTED

## 2022-03-09 LAB — LACTIC ACID, PLASMA: Lactic Acid, Venous: 1.1 mmol/L (ref 0.5–1.9)

## 2022-03-09 LAB — PROTIME-INR
INR: 1.2 (ref 0.8–1.2)
Prothrombin Time: 14.7 seconds (ref 11.4–15.2)

## 2022-03-09 LAB — ETHANOL: Alcohol, Ethyl (B): <10 mg/dL (ref <10)

## 2022-03-09 MED ORDER — SODIUM CHLORIDE 0.9 % IV SOLN: INTRAVENOUS | Status: DC

## 2022-03-09 MED ORDER — ONDANSETRON HCL 4 MG/2ML IJ SOLN: 4.0000 mg | Freq: Four times a day (QID) | INTRAMUSCULAR | Status: DC | PRN

## 2022-03-09 MED ORDER — PROPOFOL 1000 MG/100ML IV EMUL
INTRAVENOUS | Status: AC
  Filled 2022-03-09: qty 100

## 2022-03-09 MED ORDER — IOHEXOL 350 MG/ML SOLN: 75.0000 mL | Freq: Once | INTRAVENOUS | Status: DC | PRN

## 2022-03-09 MED ORDER — POTASSIUM CHLORIDE 20 MEQ PO PACK
20.0000 meq | PACK | ORAL | Status: AC
  Administered 2022-03-09 – 2022-03-10 (×2): 20 meq
  Filled 2022-03-09 (×2): qty 1

## 2022-03-09 MED ORDER — SUCCINYLCHOLINE CHLORIDE 20 MG/ML IJ SOLN
INTRAMUSCULAR | Status: DC | PRN
  Administered 2022-03-09: 100 mg via INTRAVENOUS

## 2022-03-09 MED ORDER — HYDRALAZINE HCL 20 MG/ML IJ SOLN
10.0000 mg | INTRAMUSCULAR | Status: DC | PRN
  Administered 2022-03-15 (×2): 10 mg via INTRAVENOUS
  Filled 2022-03-09 (×2): qty 1

## 2022-03-09 MED ORDER — CEFAZOLIN SODIUM-DEXTROSE 2-4 GM/100ML-% IV SOLN
2.0000 g | Freq: Once | INTRAVENOUS | Status: AC
  Administered 2022-03-09: 2 g via INTRAVENOUS
  Filled 2022-03-09: qty 100

## 2022-03-09 MED ORDER — INSULIN ASPART 100 UNIT/ML IJ SOLN
2.0000 [IU] | INTRAMUSCULAR | Status: DC
  Administered 2022-03-09 – 2022-03-10 (×2): 4 [IU] via SUBCUTANEOUS
  Administered 2022-03-10 (×2): 2 [IU] via SUBCUTANEOUS
  Administered 2022-03-11 (×2): 4 [IU] via SUBCUTANEOUS
  Administered 2022-03-11 (×3): 2 [IU] via SUBCUTANEOUS
  Administered 2022-03-11 – 2022-03-12 (×2): 4 [IU] via SUBCUTANEOUS
  Administered 2022-03-12: 6 [IU] via SUBCUTANEOUS
  Administered 2022-03-12 – 2022-03-13 (×3): 4 [IU] via SUBCUTANEOUS
  Administered 2022-03-13: 6 [IU] via SUBCUTANEOUS

## 2022-03-09 MED ORDER — DOCUSATE SODIUM 50 MG/5ML PO LIQD
100.0000 mg | Freq: Two times a day (BID) | ORAL | Status: DC
  Administered 2022-03-09 – 2022-03-19 (×16): 100 mg
  Filled 2022-03-09 (×18): qty 10

## 2022-03-09 MED ORDER — POTASSIUM CHLORIDE 10 MEQ/100ML IV SOLN
10.0000 meq | INTRAVENOUS | Status: AC
  Administered 2022-03-09 – 2022-03-10 (×4): 10 meq via INTRAVENOUS
  Filled 2022-03-09 (×4): qty 100

## 2022-03-09 MED ORDER — FENTANYL 2500MCG IN NS 250ML (10MCG/ML) PREMIX INFUSION
INTRAVENOUS | Status: AC
  Filled 2022-03-09: qty 250

## 2022-03-09 MED ORDER — PANTOPRAZOLE SODIUM 40 MG PO TBEC
40.0000 mg | DELAYED_RELEASE_TABLET | Freq: Every day | ORAL | Status: DC
  Filled 2022-03-09: qty 1

## 2022-03-09 MED ORDER — CHLORHEXIDINE GLUCONATE CLOTH 2 % EX PADS
6.0000 | MEDICATED_PAD | Freq: Every day | CUTANEOUS | Status: DC
  Administered 2022-03-10 – 2022-03-20 (×11): 6 via TOPICAL

## 2022-03-09 MED ORDER — ORAL CARE MOUTH RINSE: 15.0000 mL | OROMUCOSAL | Status: DC | PRN

## 2022-03-09 MED ORDER — PANTOPRAZOLE SODIUM 40 MG IV SOLR
40.0000 mg | Freq: Every day | INTRAVENOUS | Status: DC
  Administered 2022-03-09 – 2022-03-19 (×11): 40 mg via INTRAVENOUS
  Filled 2022-03-09 (×11): qty 10

## 2022-03-09 MED ORDER — POTASSIUM CHLORIDE CRYS ER 20 MEQ PO TBCR: 20.0000 meq | EXTENDED_RELEASE_TABLET | ORAL | Status: DC

## 2022-03-09 MED ORDER — ORAL CARE MOUTH RINSE
15.0000 mL | OROMUCOSAL | Status: DC
  Administered 2022-03-09 – 2022-03-18 (×107): 15 mL via OROMUCOSAL

## 2022-03-09 MED ORDER — FENTANYL CITRATE PF 50 MCG/ML IJ SOSY: 25.0000 ug | PREFILLED_SYRINGE | Freq: Once | INTRAMUSCULAR | Status: DC

## 2022-03-09 MED ORDER — LEVETIRACETAM IN NACL 500 MG/100ML IV SOLN
500.0000 mg | Freq: Two times a day (BID) | INTRAVENOUS | Status: DC
  Administered 2022-03-09 – 2022-03-12 (×8): 500 mg via INTRAVENOUS
  Filled 2022-03-09 (×8): qty 100

## 2022-03-09 MED ORDER — FENTANYL BOLUS VIA INFUSION
25.0000 ug | INTRAVENOUS | Status: DC | PRN
  Administered 2022-03-10: 50 ug via INTRAVENOUS
  Administered 2022-03-11 – 2022-03-13 (×2): 25 ug via INTRAVENOUS
  Administered 2022-03-13 (×2): 50 ug via INTRAVENOUS
  Administered 2022-03-13 (×2): 25 ug via INTRAVENOUS
  Administered 2022-03-14: 100 ug via INTRAVENOUS
  Administered 2022-03-14: 50 ug via INTRAVENOUS

## 2022-03-09 MED ORDER — POLYETHYLENE GLYCOL 3350 17 G PO PACK
17.0000 g | PACK | Freq: Every day | ORAL | Status: DC
  Administered 2022-03-10 – 2022-03-19 (×7): 17 g
  Filled 2022-03-09 (×10): qty 1

## 2022-03-09 MED ORDER — PROPOFOL 1000 MG/100ML IV EMUL
5.0000 ug/kg/min | INTRAVENOUS | Status: DC
  Administered 2022-03-09: 50 ug/kg/min via INTRAVENOUS
  Administered 2022-03-09: 20 ug/kg/min via INTRAVENOUS
  Administered 2022-03-10 – 2022-03-11 (×3): 30 ug/kg/min via INTRAVENOUS
  Administered 2022-03-11: 20 ug/kg/min via INTRAVENOUS
  Filled 2022-03-09 (×7): qty 100

## 2022-03-09 MED ORDER — FENTANYL 2500MCG IN NS 250ML (10MCG/ML) PREMIX INFUSION
25.0000 ug/h | INTRAVENOUS | Status: DC
  Administered 2022-03-11: 100 ug/h via INTRAVENOUS
  Administered 2022-03-14: 50 ug/h via INTRAVENOUS
  Filled 2022-03-09 (×3): qty 250

## 2022-03-09 MED ORDER — ONDANSETRON 4 MG PO TBDP: 4.0000 mg | ORAL_TABLET | Freq: Four times a day (QID) | ORAL | Status: DC | PRN

## 2022-03-09 MED ORDER — IOHEXOL 350 MG/ML SOLN
75.0000 mL | Freq: Once | INTRAVENOUS | Status: AC | PRN
  Administered 2022-03-09: 75 mL via INTRAVENOUS

## 2022-03-09 MED ORDER — ETOMIDATE 2 MG/ML IV SOLN
INTRAVENOUS | Status: DC | PRN
  Administered 2022-03-09: 20 mg via INTRAVENOUS

## 2022-03-09 NOTE — H&P (Signed)
David Mendoza 1940-05-18  [id number].    Requesting MD: Dr. Vanita Panda Chief Complaint/Reason for Consult: level 2 trauma - upgrade to level 1 for GCS  Primary Survey: Airway - Intubated by EDP Breath sounds present bilaterally Circulation - intact radial and DP pulses Disability - GCS E4, V3, M2   HPI: David Mendoza is a 82 y.o. male who presented as a level 2 trauma after a fall. Patient stepped off a curb and fell from standing height. Unclear if witnessed. Responsive initially but once in ED become altered with decreased GCS and was intubated. Taken to CT scanner for further workup. After return from scanner he is more purposeful in movements.  PMhx: HTN, HLD, DM2, DVT x 2 Blood thinners: not on anticoagulation per med list but with history of chronic anticoagulation  ROS: Review of Systems  Unable to perform ROS: Acuity of condition   No family history on file.  Past Medical History:  Diagnosis Date   Chronic anticoagulation    Diabetic retinopathy ([place])    DVT (deep venous thrombosis) ([facility])    x2   Hyperlipemia    Hypertension    Multiple falls    Nuclear sclerotic cataract of left eye 12/25/2019   Nuclear sclerotic cataract of right eye 12/25/2019   Peripheral neuropathy    Posterior vitreous detachment of left eye 12/25/2019   Sciatica     Past Surgical History:  Procedure Laterality Date   CRANIOTOMY Bilateral 04/07/2014   Procedure: BILATERAL CRANIOTOMY HEMATOMA EVACUATION SUBDURAL;  Surgeon: Karie Chimera, MD;  Location: [facility];  Service: Neurosurgery;  Laterality: Bilateral;   PILONIDAL CYST EXCISION      Social History:  reports that he has quit smoking. His smoking use included cigarettes. He has never used smokeless tobacco. He reports current alcohol use. He reports that he does not use drugs.  Allergies:  Allergies  Allergen Reactions   Sulfa Antibiotics Diarrhea    (Not in a hospital admission)   Blood pressure (!) 142/70,  pulse 62, temperature 97.6 F (36.4 C), temperature source Tympanic, resp. rate 16, height 5' 10.98" (1.803 m), weight 89.4 kg, SpO2 100 %. Physical Exam: General: elderly male in respiratory distress prior to intubation HEENT: Sclera are noninjected.  Pupils equal and round. EOMs intact.  Ears and nose without any masses or lesions.  Mouth is pink and moist Posterior scalp laceration with active bleeding. Stapled by EDP Heart: regular, rate, and rhythm.  Normal s1,s2. No obvious murmurs, gallops, or rubs noted.  Palpable radial and pedal pulses bilaterally Lungs: CTAB. On ventilator Abd: soft, ND, +BS, no masses, hernias, or organomegaly MSK: bilateral lower extremities with chronic edema and superficial skin wounds Skin: warm and dry Neuro: GCS 9 prior to intubation   Results for orders placed or performed during the hospital encounter of 03/09/22 (from the past 48 hour(s))  CBC     Status: Abnormal   Collection Time: 03/09/22 12:01 PM  Result Value Ref Range   WBC 6.6 4.0 - 10.5 K/uL   RBC 2.96 (L) 4.22 - 5.81 MIL/uL   Hemoglobin 9.4 (L) 13.0 - 17.0 g/dL   HCT 28.7 (L) 39.0 - 52.0 %   MCV 97.0 80.0 - 100.0 fL   MCH 31.8 26.0 - 34.0 pg   MCHC 32.8 30.0 - 36.0 g/dL   RDW 14.0 11.5 - 15.5 %   Platelets 220 150 - 400 K/uL   nRBC 0.0 0.0 - 0.2 %  Comment: Performed at [facility], [place] [address] [place], [place] [address]  Ethanol     Status: None   Collection Time: 03/09/22 12:01 PM  Result Value Ref Range   Alcohol, Ethyl (B) <10 <10 mg/dL    Comment: (NOTE) Lowest detectable limit for serum alcohol is 10 mg/dL.  For medical purposes only. Performed at [facility], [place] [address], [place], [place] [address]   Lactic acid, plasma     Status: None   Collection Time: 03/09/22 12:01 PM  Result Value Ref Range   Lactic Acid, Venous 1.1 0.5 - 1.9 mmol/L    Comment: Performed at [facility] [address], [place], [place] [address]  Protime-INR      Status: None   Collection Time: 03/09/22 12:01 PM  Result Value Ref Range   Prothrombin Time 14.7 11.4 - 15.2 seconds   INR 1.2 0.8 - 1.2    Comment: (NOTE) INR goal varies based on device and disease states. Performed at [facility], [place] [address] [place], [place] [address]   Sample to Blood Bank     Status: None   Collection Time: 03/09/22 12:01 PM  Result Value Ref Range   Blood Bank Specimen SAMPLE AVAILABLE FOR TESTING    Sample Expiration      03/12/2022,2359 Performed at [facility], [place] [address], [place], [place] [address]   I-Stat Chem 8, ED     Status: Abnormal   Collection Time: 03/09/22 12:11 PM  Result Value Ref Range   Sodium 139 135 - 145 mmol/L   Potassium 4.6 3.5 - 5.1 mmol/L   Chloride 109 98 - 111 mmol/L   BUN 34 (H) 8 - 23 mg/dL   Creatinine, Ser 2.30 (H) 0.61 - 1.24 mg/dL   Glucose, Bld 198 (H) 70 - 99 mg/dL    Comment: Glucose reference range applies only to samples taken after fasting for at least 8 hours.   Calcium, Ion 0.96 (L) 1.15 - 1.40 mmol/L   TCO2 23 22 - 32 mmol/L   Hemoglobin 8.5 (L) 13.0 - 17.0 g/dL   HCT 25.0 (L) 39.0 - 52.0 %   CT HEAD WO CONTRAST  Result Date: 03/09/2022 CLINICAL DATA:  Provided history: Polytrauma, blunt. Head trauma, moderate/severe. EXAM: CT HEAD WITHOUT CONTRAST CT CERVICAL SPINE WITHOUT CONTRAST TECHNIQUE: Multidetector CT imaging of the head and cervical spine was performed following the standard protocol without intravenous contrast. Multiplanar CT image reconstructions of the cervical spine were also generated. RADIATION DOSE REDUCTION: This exam was performed according to the departmental dose-optimization program which includes automated exposure control, adjustment of the mA and/or kV according to patient size and/or use of iterative reconstruction technique. COMPARISON:  Head CT 05/11/2014. FINDINGS: CT HEAD FINDINGS Brain: Generalized cerebral atrophy. Chronic subdural hematomas overlying the right  cerebral hemisphere and along the right aspect of the falx, measuring up to 11 mm in thickness. Subdural hematoma overlying the left cerebral hemisphere measuring up to 13 mm in thickness. This subdural hematoma is predominantly hypodense and chronic. However, there is small-volume acute hemorrhage within this collection posteriorly (for instance as seen on series 3, image 20). Additionally, there is small-volume acute subdural hemorrhage along the medial aspect of the left tentorium (series 7, image 57). The subdural hematomas exert balanced mass effect upon the underlying cerebral hemispheres without midline shift. Additionally No demarcated cortical infarct. No evidence of an intracranial mass. No midline shift. Vascular: No hyperdense  vessel.  Atherosclerotic calcifications. Skull: No mass or acute finding within the imaged orbits. Bilateral parietal cranioplasties. Sinuses/Orbits: Mild mucosal thickening within the bilateral maxillary sinuses. Minimal mucosal thickening within the left sphenoid sinus. Opacification of bilateral ethmoid air cells due to the presence of mucosal thickening and fluid, overall moderate in severity. Other: Left parietal scalp laceration and hematoma (with scalp staples at this site). CT CERVICAL SPINE FINDINGS Mildly motion degraded exam. Alignment: Straightening of the expected cervical lordosis. No significant spondylolisthesis. Skull base and vertebrae: The basion-dental and atlanto-dental intervals are maintained.No evidence of acute fracture to the cervical spine. Soft tissues and spinal canal: No prevertebral fluid or swelling. No visible canal hematoma. Disc levels: Spondylosis with multilevel disc space narrowing, disc bulges/central disc protrusions, posterior disc osteophyte complexes and uncovertebral hypertrophy. Multilevel spinal canal stenosis. Most notably, a posterior disc osteophyte complex at C3-C4 contributes to suspected severe spinal canal stenosis, and posterior  disc osteophyte complexes at C4-C5 and C5-C6 contribute to at least moderate spinal canal stenosis. Multilevel bony neural foraminal narrowing. Upper chest: Addition within the imaged lung apices. No visible pneumothorax. Partially imaged left pleural effusion. Attempts are being made to reach the ordering provider at this time. IMPRESSION: CT head: 1. Subdural hematoma overlying the left cerebral hemisphere measuring up to 13 mm in thickness. This hematoma is predominantly hypodense and chronic. However, there is small-volume acute hemorrhage within this collection posteriorly. 2. Small-volume acute subdural hemorrhage along the left tentorium. 3. Chronic subdural hematomas overlying the right cerebral hemisphere, and along the right aspect of the falx, measuring up to 11 mm in thickness. 4. The subdural hematomas exert balanced mass effect upon the underlying cerebral hemispheres without midline shift. 5. Mild cerebral atrophy. 6. Left parietal scalp hematoma and laceration (with scalp staples at this site). CT cervical spine: 1. Mildly motion degraded exam. 2. No evidence of acute fracture to the cervical spine. 3. Cervical spondylosis, as described. Notably, there is suspected severe spinal canal stenosis at C3-C4, and at least moderate spinal canal stenosis at C4-C5 and C5-C6. 4. Partially imaged left pleural effusion. Electronically Signed   By: Kellie Simmering D.O.   On: 03/09/2022 13:10   CT Cervical Spine Wo Contrast  Result Date: 03/09/2022 CLINICAL DATA:  Provided history: Polytrauma, blunt. Head trauma, moderate/severe. EXAM: CT HEAD WITHOUT CONTRAST CT CERVICAL SPINE WITHOUT CONTRAST TECHNIQUE: Multidetector CT imaging of the head and cervical spine was performed following the standard protocol without intravenous contrast. Multiplanar CT image reconstructions of the cervical spine were also generated. RADIATION DOSE REDUCTION: This exam was performed according to the departmental dose-optimization  program which includes automated exposure control, adjustment of the mA and/or kV according to patient size and/or use of iterative reconstruction technique. COMPARISON:  Head CT 05/11/2014. FINDINGS: CT HEAD FINDINGS Brain: Generalized cerebral atrophy. Chronic subdural hematomas overlying the right cerebral hemisphere and along the right aspect of the falx, measuring up to 11 mm in thickness. Subdural hematoma overlying the left cerebral hemisphere measuring up to 13 mm in thickness. This subdural hematoma is predominantly hypodense and chronic. However, there is small-volume acute hemorrhage within this collection posteriorly (for instance as seen on series 3, image 20). Additionally, there is small-volume acute subdural hemorrhage along the medial aspect of the left tentorium (series 7, image 57). The subdural hematomas exert balanced mass effect upon the underlying cerebral hemispheres without midline shift. Additionally No demarcated cortical infarct. No evidence of an intracranial mass. No midline shift. Vascular: No hyperdense vessel.  Atherosclerotic calcifications.  Skull: No mass or acute finding within the imaged orbits. Bilateral parietal cranioplasties. Sinuses/Orbits: Mild mucosal thickening within the bilateral maxillary sinuses. Minimal mucosal thickening within the left sphenoid sinus. Opacification of bilateral ethmoid air cells due to the presence of mucosal thickening and fluid, overall moderate in severity. Other: Left parietal scalp laceration and hematoma (with scalp staples at this site). CT CERVICAL SPINE FINDINGS Mildly motion degraded exam. Alignment: Straightening of the expected cervical lordosis. No significant spondylolisthesis. Skull base and vertebrae: The basion-dental and atlanto-dental intervals are maintained.No evidence of acute fracture to the cervical spine. Soft tissues and spinal canal: No prevertebral fluid or swelling. No visible canal hematoma. Disc levels: Spondylosis  with multilevel disc space narrowing, disc bulges/central disc protrusions, posterior disc osteophyte complexes and uncovertebral hypertrophy. Multilevel spinal canal stenosis. Most notably, a posterior disc osteophyte complex at C3-C4 contributes to suspected severe spinal canal stenosis, and posterior disc osteophyte complexes at C4-C5 and C5-C6 contribute to at least moderate spinal canal stenosis. Multilevel bony neural foraminal narrowing. Upper chest: Addition within the imaged lung apices. No visible pneumothorax. Partially imaged left pleural effusion. Attempts are being made to reach the ordering provider at this time. IMPRESSION: CT head: 1. Subdural hematoma overlying the left cerebral hemisphere measuring up to 13 mm in thickness. This hematoma is predominantly hypodense and chronic. However, there is small-volume acute hemorrhage within this collection posteriorly. 2. Small-volume acute subdural hemorrhage along the left tentorium. 3. Chronic subdural hematomas overlying the right cerebral hemisphere, and along the right aspect of the falx, measuring up to 11 mm in thickness. 4. The subdural hematomas exert balanced mass effect upon the underlying cerebral hemispheres without midline shift. 5. Mild cerebral atrophy. 6. Left parietal scalp hematoma and laceration (with scalp staples at this site). CT cervical spine: 1. Mildly motion degraded exam. 2. No evidence of acute fracture to the cervical spine. 3. Cervical spondylosis, as described. Notably, there is suspected severe spinal canal stenosis at C3-C4, and at least moderate spinal canal stenosis at C4-C5 and C5-C6. 4. Partially imaged left pleural effusion. Electronically Signed   By: Kellie Simmering D.O.   On: 03/09/2022 13:10   DG Chest Port 1 View  Result Date: 03/09/2022 CLINICAL DATA:  Trauma EXAM: PORTABLE CHEST 1 VIEW COMPARISON:  CXR 03/03/22 FINDINGS: Endotracheal tube terminates approximately 5 cm above the carina. Enteric tube courses  below diaphragm with the tip out of the field of view. Unchanged right-sided pleural effusion. Unchanged enlarged cardiac and mediastinal contours. No pneumothorax. There are prominent bilateral interstitial opacities, unchanged from prior exam. No radiographically apparent displaced rib fractures. IMPRESSION: 1. Support apparatus as above. 2. Unchanged right-sided pleural effusion. 3. Unchanged prominent bilateral interstitial opacities. Electronically Signed   By: Marin Roberts M.D.   On: 03/09/2022 12:47   DG Pelvis Portable  Result Date: 03/09/2022 CLINICAL DATA:  Pain after fall EXAM: PORTABLE PELVIS 1 VIEWS COMPARISON:  None Available. FINDINGS: Mild axial joint space loss of the hip joints with small osteophytes. Small osteophytes of the sacroiliac joints. Hyperostosis. No fracture or dislocation. Preserved bone mineralization. Degenerative changes of the visualized lumbar spine at the edge of the imaging field. Overlapping cardiac leads. Please correlate with numerous other CT scans from same date IMPRESSION: Degenerative changes Electronically Signed   By: Jill Side M.D.   On: 03/09/2022 12:45      Assessment/Plan Fall Bilateral SDH, falcine hematoma - NSGY Dr. Ellene Route consulted and evaluated patient in ED. Suspects mostly chronic small acute component with h/o  craniotomy 2016 for b/l SDH. Not acute intervention recommended. Keppra for seizure prophylaxis. Recommends repeat CT scan in 12 hours Scalp laceration with hematoma - stapled by EDP. Removal in 10-14 days VDRF - continue ventilator support Pancreatic duct stricture - incidental finding. Will need outpatient follow up HTN H/o multiple falls - not on anticoagulation currently per med list review  CT c spine negative. Continue C collar for now until reliable neuro exam.  Admit to trauma - 4N ICU  FEN: NPO, OGT, IVF ID: [id number] in ED VTE: SCDs. Hold chemical ppx Foley - Place for I/O Dispo: Admit to ICU  Winferd Humphrey,  [facility] 03/09/2022, 1:32 PM Please see Amion for pager number during day hours 7:00am-4:30pm

## 2022-03-09 NOTE — ED Notes (Signed)
Report given to ICU

## 2022-03-09 NOTE — ED Provider Notes (Signed)
[facility] Provider Note   CSN: [id number] Arrival date & time: 03/09/22  1152     History  Chief Complaint  Patient presents with   David Mendoza is a 82 y.o. male.  HPI Patient presents as a level 2 trauma, and is upgraded soon after arrival due to unresponsiveness. History is obtained by EMS individuals, and eventually talked with the patient's family. Seemingly the patient was in his usual state of health, stepping from a curb, using his walker when he fell backward striking his head.  Patient was reportedly initially ANO x 3, but and EMS transport became altered, ANO x 1, with reported posturing.  On arrival the patient cannot provide any details of his history is receiving assisted ventilation via bag-valve-mask.  Level 5 caveat secondary to acuity of condition.    Home Medications Prior to Admission medications   Medication Sig Start Date End Date Taking? Authorizing Provider  acetaminophen (TYLENOL) 500 MG tablet Take 1,000 mg by mouth every 6 (six) hours as needed for mild pain.    [provider]  amLODipine (NORVASC) 5 MG tablet Take 5 mg by mouth daily.    [provider]  carvedilol (COREG) 25 MG tablet Take 25 mg by mouth 2 (two) times daily. 05/13/21   [provider]  Cyanocobalamin (B-12) 1000 MCG TBCR Take 1 tablet by mouth daily. 07/28/21   [provider]  ferrous gluconate (FERGON) 324 MG tablet Take 324 mg by mouth every other day.    [provider]  folic acid (FOLVITE) [id number] MCG tablet Take 800 mcg by mouth daily. Patient not taking: Reported on 01/17/2022 07/28/21   [provider]  furosemide (LASIX) 20 MG tablet Take 20 mg by mouth every other day. 11/15/21   [provider]  glipiZIDE (GLUCOTROL XL) 2.5 MG 24 hr tablet Take 2.5 mg by mouth in the morning and at bedtime. 11/11/21   [provider]  losartan (COZAAR) 100 MG tablet Take 100  mg by mouth daily.    [provider]  metFORMIN (GLUCOPHAGE) 500 MG tablet Take 500 mg by mouth 2 (two) times daily with a meal.    [provider]  omeprazole (PRILOSEC) 40 MG capsule Take 40 mg by mouth daily.    [provider]  OVER THE COUNTER MEDICATION Take 1 tablet by mouth daily. Juice Plus veggie and garden blend    [provider]  sertraline (ZOLOFT) 50 MG tablet Take 75 mg by mouth every morning. 05/13/21   [provider]  simvastatin (ZOCOR) 40 MG tablet Take 40 mg by mouth every evening. 08/12/21   [provider]      Allergies    Sulfa antibiotics    Review of Systems   Review of Systems  All other systems reviewed and are negative.   Physical Exam Updated Vital Signs BP (!) 142/70 Comment: Manual  Pulse 62   Temp 97.6 F (36.4 C) (Tympanic)   Resp 16   Ht 5' 10.98" (1.803 m)   Wt 89.4 kg   SpO2 100% Comment: ventilator  BMI 27.50 kg/m  Physical Exam Vitals and nursing note reviewed.  Constitutional:      Appearance: He is well-developed.     Comments: Unresponsive adult male moves to stimuli occasionally, does not track visually  HENT:     Head: Normocephalic.   Eyes:     Conjunctiva/sclera: Conjunctivae normal.  Neck:  Cardiovascular:     Rate and Rhythm: Normal rate and regular rhythm.  Pulmonary:     Effort: Pulmonary effort is normal. No respiratory distress.     Breath sounds: No stridor.  Abdominal:     General: There is no distension.  Skin:    General: Skin is warm and dry.  Neurological:     Comments: Does not track, does not follow commands, does not respond verbally  Psychiatric:        Cognition and Memory: Cognition is impaired.     ED Results / Procedures / Treatments   Labs (all labs ordered are listed, but only abnormal results are displayed) Labs Reviewed  CBC - Abnormal; Notable for the following components:      Result Value   RBC 2.96 (*)    Hemoglobin 9.4 (*)     HCT 28.7 (*)    All other components within normal limits  I-STAT CHEM 8, ED - Abnormal; Notable for the following components:   BUN 34 (*)    Creatinine, Ser 2.30 (*)    Glucose, Bld 198 (*)    Calcium, Ion 0.96 (*)    Hemoglobin 8.5 (*)    HCT 25.0 (*)    All other components within normal limits  I-STAT ARTERIAL BLOOD GAS, ED - Abnormal; Notable for the following components:   pO2, Arterial 509 (*)    Acid-base deficit 4.0 (*)    Potassium 3.0 (*)    HCT 22.0 (*)    Hemoglobin 7.5 (*)    All other components within normal limits  ETHANOL  LACTIC ACID, PLASMA  PROTIME-INR  URINALYSIS, ROUTINE W REFLEX MICROSCOPIC  COMPREHENSIVE METABOLIC PANEL  BLOOD GAS, ARTERIAL  SAMPLE TO BLOOD BANK    EKG None  Radiology CT HEAD WO CONTRAST  Result Date: 03/09/2022 CLINICAL DATA:  Provided history: Polytrauma, blunt. Head trauma, moderate/severe. EXAM: CT HEAD WITHOUT CONTRAST CT CERVICAL SPINE WITHOUT CONTRAST TECHNIQUE: Multidetector CT imaging of the head and cervical spine was performed following the standard protocol without intravenous contrast. Multiplanar CT image reconstructions of the cervical spine were also generated. RADIATION DOSE REDUCTION: This exam was performed according to the departmental dose-optimization program which includes automated exposure control, adjustment of the mA and/or kV according to patient size and/or use of iterative reconstruction technique. COMPARISON:  Head CT 05/11/2014. FINDINGS: CT HEAD FINDINGS Brain: Generalized cerebral atrophy. Chronic subdural hematomas overlying the right cerebral hemisphere and along the right aspect of the falx, measuring up to 11 mm in thickness. Subdural hematoma overlying the left cerebral hemisphere measuring up to 13 mm in thickness. This subdural hematoma is predominantly hypodense and chronic. However, there is small-volume acute hemorrhage within this collection posteriorly (for instance as seen on series 3, image 20).  Additionally, there is small-volume acute subdural hemorrhage along the medial aspect of the left tentorium (series 7, image 57). The subdural hematomas exert balanced mass effect upon the underlying cerebral hemispheres without midline shift. Additionally No demarcated cortical infarct. No evidence of an intracranial mass. No midline shift. Vascular: No hyperdense vessel.  Atherosclerotic calcifications. Skull: No mass or acute finding within the imaged orbits. Bilateral parietal cranioplasties. Sinuses/Orbits: Mild mucosal thickening within the bilateral maxillary sinuses. Minimal mucosal thickening within the left sphenoid sinus. Opacification of bilateral ethmoid air cells due to the presence of mucosal thickening and fluid, overall moderate in severity. Other: Left parietal scalp laceration and hematoma (with scalp staples at this site). CT CERVICAL SPINE FINDINGS Mildly motion degraded exam.  Alignment: Straightening of the expected cervical lordosis. No significant spondylolisthesis. Skull base and vertebrae: The basion-dental and atlanto-dental intervals are maintained.No evidence of acute fracture to the cervical spine. Soft tissues and spinal canal: No prevertebral fluid or swelling. No visible canal hematoma. Disc levels: Spondylosis with multilevel disc space narrowing, disc bulges/central disc protrusions, posterior disc osteophyte complexes and uncovertebral hypertrophy. Multilevel spinal canal stenosis. Most notably, a posterior disc osteophyte complex at C3-C4 contributes to suspected severe spinal canal stenosis, and posterior disc osteophyte complexes at C4-C5 and C5-C6 contribute to at least moderate spinal canal stenosis. Multilevel bony neural foraminal narrowing. Upper chest: Addition within the imaged lung apices. No visible pneumothorax. Partially imaged left pleural effusion. Attempts are being made to reach the ordering provider at this time. IMPRESSION: CT head: 1. Subdural hematoma  overlying the left cerebral hemisphere measuring up to 13 mm in thickness. This hematoma is predominantly hypodense and chronic. However, there is small-volume acute hemorrhage within this collection posteriorly. 2. Small-volume acute subdural hemorrhage along the left tentorium. 3. Chronic subdural hematomas overlying the right cerebral hemisphere, and along the right aspect of the falx, measuring up to 11 mm in thickness. 4. The subdural hematomas exert balanced mass effect upon the underlying cerebral hemispheres without midline shift. 5. Mild cerebral atrophy. 6. Left parietal scalp hematoma and laceration (with scalp staples at this site). CT cervical spine: 1. Mildly motion degraded exam. 2. No evidence of acute fracture to the cervical spine. 3. Cervical spondylosis, as described. Notably, there is suspected severe spinal canal stenosis at C3-C4, and at least moderate spinal canal stenosis at C4-C5 and C5-C6. 4. Partially imaged left pleural effusion. Electronically Signed   By: Kellie Simmering D.O.   On: 03/09/2022 13:10   CT Cervical Spine Wo Contrast  Result Date: 03/09/2022 CLINICAL DATA:  Provided history: Polytrauma, blunt. Head trauma, moderate/severe. EXAM: CT HEAD WITHOUT CONTRAST CT CERVICAL SPINE WITHOUT CONTRAST TECHNIQUE: Multidetector CT imaging of the head and cervical spine was performed following the standard protocol without intravenous contrast. Multiplanar CT image reconstructions of the cervical spine were also generated. RADIATION DOSE REDUCTION: This exam was performed according to the departmental dose-optimization program which includes automated exposure control, adjustment of the mA and/or kV according to patient size and/or use of iterative reconstruction technique. COMPARISON:  Head CT 05/11/2014. FINDINGS: CT HEAD FINDINGS Brain: Generalized cerebral atrophy. Chronic subdural hematomas overlying the right cerebral hemisphere and along the right aspect of the falx, measuring up  to 11 mm in thickness. Subdural hematoma overlying the left cerebral hemisphere measuring up to 13 mm in thickness. This subdural hematoma is predominantly hypodense and chronic. However, there is small-volume acute hemorrhage within this collection posteriorly (for instance as seen on series 3, image 20). Additionally, there is small-volume acute subdural hemorrhage along the medial aspect of the left tentorium (series 7, image 57). The subdural hematomas exert balanced mass effect upon the underlying cerebral hemispheres without midline shift. Additionally No demarcated cortical infarct. No evidence of an intracranial mass. No midline shift. Vascular: No hyperdense vessel.  Atherosclerotic calcifications. Skull: No mass or acute finding within the imaged orbits. Bilateral parietal cranioplasties. Sinuses/Orbits: Mild mucosal thickening within the bilateral maxillary sinuses. Minimal mucosal thickening within the left sphenoid sinus. Opacification of bilateral ethmoid air cells due to the presence of mucosal thickening and fluid, overall moderate in severity. Other: Left parietal scalp laceration and hematoma (with scalp staples at this site). CT CERVICAL SPINE FINDINGS Mildly motion degraded exam. Alignment: Straightening of the  expected cervical lordosis. No significant spondylolisthesis. Skull base and vertebrae: The basion-dental and atlanto-dental intervals are maintained.No evidence of acute fracture to the cervical spine. Soft tissues and spinal canal: No prevertebral fluid or swelling. No visible canal hematoma. Disc levels: Spondylosis with multilevel disc space narrowing, disc bulges/central disc protrusions, posterior disc osteophyte complexes and uncovertebral hypertrophy. Multilevel spinal canal stenosis. Most notably, a posterior disc osteophyte complex at C3-C4 contributes to suspected severe spinal canal stenosis, and posterior disc osteophyte complexes at C4-C5 and C5-C6 contribute to at least  moderate spinal canal stenosis. Multilevel bony neural foraminal narrowing. Upper chest: Addition within the imaged lung apices. No visible pneumothorax. Partially imaged left pleural effusion. Attempts are being made to reach the ordering provider at this time. IMPRESSION: CT head: 1. Subdural hematoma overlying the left cerebral hemisphere measuring up to 13 mm in thickness. This hematoma is predominantly hypodense and chronic. However, there is small-volume acute hemorrhage within this collection posteriorly. 2. Small-volume acute subdural hemorrhage along the left tentorium. 3. Chronic subdural hematomas overlying the right cerebral hemisphere, and along the right aspect of the falx, measuring up to 11 mm in thickness. 4. The subdural hematomas exert balanced mass effect upon the underlying cerebral hemispheres without midline shift. 5. Mild cerebral atrophy. 6. Left parietal scalp hematoma and laceration (with scalp staples at this site). CT cervical spine: 1. Mildly motion degraded exam. 2. No evidence of acute fracture to the cervical spine. 3. Cervical spondylosis, as described. Notably, there is suspected severe spinal canal stenosis at C3-C4, and at least moderate spinal canal stenosis at C4-C5 and C5-C6. 4. Partially imaged left pleural effusion. Electronically Signed   By: Kellie Simmering D.O.   On: 03/09/2022 13:10   DG Chest Port 1 View  Result Date: 03/09/2022 CLINICAL DATA:  Trauma EXAM: PORTABLE CHEST 1 VIEW COMPARISON:  CXR 03/03/22 FINDINGS: Endotracheal tube terminates approximately 5 cm above the carina. Enteric tube courses below diaphragm with the tip out of the field of view. Unchanged right-sided pleural effusion. Unchanged enlarged cardiac and mediastinal contours. No pneumothorax. There are prominent bilateral interstitial opacities, unchanged from prior exam. No radiographically apparent displaced rib fractures. IMPRESSION: 1. Support apparatus as above. 2. Unchanged right-sided pleural  effusion. 3. Unchanged prominent bilateral interstitial opacities. Electronically Signed   By: Marin Roberts M.D.   On: 03/09/2022 12:47   DG Pelvis Portable  Result Date: 03/09/2022 CLINICAL DATA:  Pain after fall EXAM: PORTABLE PELVIS 1 VIEWS COMPARISON:  None Available. FINDINGS: Mild axial joint space loss of the hip joints with small osteophytes. Small osteophytes of the sacroiliac joints. Hyperostosis. No fracture or dislocation. Preserved bone mineralization. Degenerative changes of the visualized lumbar spine at the edge of the imaging field. Overlapping cardiac leads. Please correlate with numerous other CT scans from same date IMPRESSION: Degenerative changes Electronically Signed   By: Jill Side M.D.   On: 03/09/2022 12:45    Procedures Procedures    Medications Ordered in ED Medications  iohexol (OMNIPAQUE) 350 MG/ML injection 75 mL (has no administration in time range)  propofol (DIPRIVAN) 1000 MG/100ML infusion ( Intravenous New Bag/Given 03/09/22 1210)  fentaNYL 2554mg in NS 2532m(1048mml) infusion-PREMIX ( Intravenous New Bag/Given 03/09/22 1305)  fentaNYL (SUBLIMAZE) bolus via infusion 25-100 mcg (has no administration in time range)  0.9 %  sodium chloride infusion ( Intravenous New Bag/Given 03/09/22 1443)  docusate (COLACE) 50 MG/5ML liquid 100 mg (has no administration in time range)  polyethylene glycol (MIRALAX / GLYCOLAX) packet 17 g (  has no administration in time range)  ondansetron (ZOFRAN-ODT) disintegrating tablet 4 mg (has no administration in time range)    Or  ondansetron (ZOFRAN) injection 4 mg (has no administration in time range)  levETIRAcetam (KEPPRA) IVPB 500 mg/100 mL premix (has no administration in time range)  hydrALAZINE (APRESOLINE) injection 10 mg (has no administration in time range)  Oral care mouth rinse (15 mLs Mouth Rinse Given 03/09/22 1444)  Oral care mouth rinse (has no administration in time range)  fentaNYL (SUBLIMAZE) injection 25 mcg  (has no administration in time range)  ceFAZolin (ANCEF) IVPB 2g/100 mL premix (has no administration in time range)  iohexol (OMNIPAQUE) 350 MG/ML injection 75 mL (75 mLs Intravenous Contrast Given 03/09/22 1251)    ED Course/ Medical Decision Making/ A&P                             Medical Decision Making Adult male patient presents after head trauma, requiring upgraded activation to level 1 due to altered mental status.  Patient had intubation performed soon after arrival to secure his airway, wound was made hemostatic with staple repair, case coordinated with our surgery colleagues.  Following stabilization, CT scan, I discussed his case with our neurosurgeon reviewed the images concerning for bleed, in the context of prior bleed, prior bur hole.  I updated the patient's daughter, and wife, initially and again at bedside prior to transfer to the ICU.  Amount and/or Complexity of Data Reviewed Independent Historian: EMS External Data Reviewed: notes. Labs: ordered. Decision-making details documented in ED Course. Radiology: ordered and independent interpretation performed. Decision-making details documented in ED Course. ECG/medicine tests: ordered and independent interpretation performed. Decision-making details documented in ED Course. Discussion of management or test interpretation with external provider(s): As above I discussed the patient's case with our trauma team, coordinating patient's evaluation, as well as with her neurosurgical colleagues. Patient will be admitted, monitoring, management.  Risk Decision regarding hospitalization.   INTUBATION Performed by: Carmin Muskrat  Required items: required blood products, implants, devices, and special equipment available Patient identity confirmed: provided demographic data and hospital-assigned identification number Time out: Immediately prior to procedure a "time out" was called to verify the correct patient, procedure,  equipment, support staff and site/side marked as required.  Indications: trauma / airway protection  Intubation method: Glidescope Laryngoscopy   Preoxygenation: BVM  Sedatives: 20Etomidate Paralytic: 100Succinylcholine  Tube Size: 8 cuffed  Post-procedure assessment: chest rise and ETCO2 monitor Breath sounds: equal and absent over the epigastrium Tube secured with: ETT holder Chest x-ray interpreted by radiologist and me.  Chest x-ray findings: endotracheal tube in appropriate position  Patient tolerated the procedure well with no immediate complications.   LACERATION REPAIR Performed by: Carmin Muskrat Authorized by: Carmin Muskrat Consent: Verbal consent obtained. Risks and benefits: risks, benefits and alternatives were discussed Consent given by: patient Patient identity confirmed: provided demographic data Prepped and Draped in normal sterile fashion Wound explored  Laceration Location: scalp  Laceration Length: 12cm  No Foreign Bodies seen or palpated  Irrigation method: syringe Amount of cleaning: standard  Skin closure: staples  Number of staples: 7  Technique: close  Patient tolerance: Patient tolerated the procedure well with no immediate complications.  CRITICAL CARE Performed by: Carmin Muskrat Total critical care time: 35 minutes Critical care time was exclusive of separately billable procedures and treating other patients. Critical care was necessary to treat or prevent imminent or life-threatening deterioration. Critical care  was time spent personally by me on the following activities: development of treatment plan with patient and/or surrogate as well as nursing, discussions with consultants, evaluation of patient's response to treatment, examination of patient, obtaining history from patient or surrogate, ordering and performing treatments and interventions, ordering and review of laboratory studies, ordering and review of radiographic  studies, pulse oximetry and re-evaluation of patient's condition.  This adult male presents after minor trauma, seemingly, initially, with falling backwards striking his skull.  However, the patient decompensated prior to ED arrival requiring intubation, sedation, and designated as a level 1 trauma.  Case coordinated with our neurosurgery and trauma colleagues.  Patient required suture repair, intubation as above, admission to the trauma ICU.  Final Clinical Impression(s) / ED Diagnoses Final diagnoses:  Fall, initial encounter  SDH (subdural hematoma) ([place])     Carmin Muskrat, MD 03/09/22 [phone]

## 2022-03-09 NOTE — ED Triage Notes (Addendum)
Pt activated as on level 1 upon arrival. Pt had a mechanical fall and hit the back of his head.  No thinners, initial GCS 14

## 2022-03-09 NOTE — Progress Notes (Signed)
Orthopedic Tech Progress Note Patient Details:  David Mendoza May 10, 1940 [id number]  Level 2 trauma upgraded to level 1. Ortho techs present upon pt arrival.  Patient ID: David Mendoza, male   DOB: 08-Nov-1940, 82 y.o.   MRN: [id number]  Carin Primrose 03/09/2022, 12:00 PM

## 2022-03-09 NOTE — Progress Notes (Signed)
Pt transported via ventilator to 4N with no apparent complications.4N RT given report.

## 2022-03-09 NOTE — Progress Notes (Deleted)
Orthopedic Tech Progress Note Patient Details:  David Mendoza Sep 23, 1940 [id number]  Level II trauma upgraded to Level I. Ortho techs are not needed at this time.  Patient ID: David Mendoza, male   DOB: 04/15/1940, 82 y.o.   MRN: [id number]  Carin Primrose 03/09/2022, 5:59 PM

## 2022-03-09 NOTE — Progress Notes (Signed)
Pt was transported to CT via vent with no complications noted. Pt returned to ED Room 2 on currently documented settings.

## 2022-03-09 NOTE — Consult Note (Signed)
Reason for Consult: Possible closed head injury, seizure Referring Physician: Dr. Barbera Setters is an 82 y.o. male.  HPI: Patient is an 82 year old individual who apparently had a fall from standing height.  He apparently hit the back of his head.  There was some Eschen of seizure activity with extensor posturing that was noted which prompted intubation of the patient upon arrival here.  CT scan of the head has been performed.  The CT reveals that the patient has had previous bilateral bur holes for subdural collections and the CT demonstrates that there are chronic bilateral subdural collections there is a very small amount of cute blood in the left posterior portion of the cranium and a very small tentorial amount of blood medially.  There is no shift or mass effect from the collections that are present.  The patient has considerable cerebral atrophy.  Past Medical History:  Diagnosis Date   Chronic anticoagulation    Diabetic retinopathy ([place])    DVT (deep venous thrombosis) ([facility])    x2   Hyperlipemia    Hypertension    Multiple falls    Nuclear sclerotic cataract of left eye 12/25/2019   Nuclear sclerotic cataract of right eye 12/25/2019   Peripheral neuropathy    Posterior vitreous detachment of left eye 12/25/2019   Sciatica     Past Surgical History:  Procedure Laterality Date   CRANIOTOMY Bilateral 04/07/2014   Procedure: BILATERAL CRANIOTOMY HEMATOMA EVACUATION SUBDURAL;  Surgeon: Karie Chimera, MD;  Location: [facility];  Service: Neurosurgery;  Laterality: Bilateral;   PILONIDAL CYST EXCISION      No family history on file.  Social History:  reports that he has quit smoking. His smoking use included cigarettes. He has never used smokeless tobacco. He reports current alcohol use. He reports that he does not use drugs.  Allergies:  Allergies  Allergen Reactions   Sulfa Antibiotics Diarrhea    Medications: I have not reviewed the patient's  medications but I am told he is not on anticoagulants.  Results for orders placed or performed during the hospital encounter of 03/09/22 (from the past 48 hour(s))  CBC     Status: Abnormal   Collection Time: 03/09/22 12:01 PM  Result Value Ref Range   WBC 6.6 4.0 - 10.5 K/uL   RBC 2.96 (L) 4.22 - 5.81 MIL/uL   Hemoglobin 9.4 (L) 13.0 - 17.0 g/dL   HCT 28.7 (L) 39.0 - 52.0 %   MCV 97.0 80.0 - 100.0 fL   MCH 31.8 26.0 - 34.0 pg   MCHC 32.8 30.0 - 36.0 g/dL   RDW 14.0 11.5 - 15.5 %   Platelets 220 150 - 400 K/uL   nRBC 0.0 0.0 - 0.2 %    Comment: Performed at [facility], [place] [address], [place], [place] [address]  Ethanol     Status: None   Collection Time: 03/09/22 12:01 PM  Result Value Ref Range   Alcohol, Ethyl (B) <10 <10 mg/dL    Comment: (NOTE) Lowest detectable limit for serum alcohol is 10 mg/dL.  For medical purposes only. Performed at [facility], [place] [address], [place], [place] [address]   Lactic acid, plasma     Status: None   Collection Time: 03/09/22 12:01 PM  Result Value Ref Range   Lactic Acid, Venous 1.1 0.5 - 1.9 mmol/L    Comment: Performed at [facility] [address], [place], [place] [address]  Protime-INR  Status: None   Collection Time: 03/09/22 12:01 PM  Result Value Ref Range   Prothrombin Time 14.7 11.4 - 15.2 seconds   INR 1.2 0.8 - 1.2    Comment: (NOTE) INR goal varies based on device and disease states. Performed at [facility], [place] [address], [place], [place] [address]   Sample to Blood Bank     Status: None   Collection Time: 03/09/22 12:01 PM  Result Value Ref Range   Blood Bank Specimen SAMPLE AVAILABLE FOR TESTING    Sample Expiration      03/12/2022,2359 Performed at [facility], [place] [address] [place], [place] [address]   I-Stat Chem 8, ED     Status: Abnormal   Collection Time: 03/09/22 12:11 PM  Result Value Ref Range   Sodium 139 135 - 145 mmol/L   Potassium 4.6 3.5 - 5.1 mmol/L    Chloride 109 98 - 111 mmol/L   BUN 34 (H) 8 - 23 mg/dL   Creatinine, Ser 2.30 (H) 0.61 - 1.24 mg/dL   Glucose, Bld 198 (H) 70 - 99 mg/dL    Comment: Glucose reference range applies only to samples taken after fasting for at least 8 hours.   Calcium, Ion 0.96 (L) 1.15 - 1.40 mmol/L   TCO2 23 22 - 32 mmol/L   Hemoglobin 8.5 (L) 13.0 - 17.0 g/dL   HCT 25.0 (L) 39.0 - 52.0 %    CT HEAD WO CONTRAST  Result Date: 03/09/2022 CLINICAL DATA:  Provided history: Polytrauma, blunt. Head trauma, moderate/severe. EXAM: CT HEAD WITHOUT CONTRAST CT CERVICAL SPINE WITHOUT CONTRAST TECHNIQUE: Multidetector CT imaging of the head and cervical spine was performed following the standard protocol without intravenous contrast. Multiplanar CT image reconstructions of the cervical spine were also generated. RADIATION DOSE REDUCTION: This exam was performed according to the departmental dose-optimization program which includes automated exposure control, adjustment of the mA and/or kV according to patient size and/or use of iterative reconstruction technique. COMPARISON:  Head CT 05/11/2014. FINDINGS: CT HEAD FINDINGS Brain: Generalized cerebral atrophy. Chronic subdural hematomas overlying the right cerebral hemisphere and along the right aspect of the falx, measuring up to 11 mm in thickness. Subdural hematoma overlying the left cerebral hemisphere measuring up to 13 mm in thickness. This subdural hematoma is predominantly hypodense and chronic. However, there is small-volume acute hemorrhage within this collection posteriorly (for instance as seen on series 3, image 20). Additionally, there is small-volume acute subdural hemorrhage along the medial aspect of the left tentorium (series 7, image 57). The subdural hematomas exert balanced mass effect upon the underlying cerebral hemispheres without midline shift. Additionally No demarcated cortical infarct. No evidence of an intracranial mass. No midline shift. Vascular: No  hyperdense vessel.  Atherosclerotic calcifications. Skull: No mass or acute finding within the imaged orbits. Bilateral parietal cranioplasties. Sinuses/Orbits: Mild mucosal thickening within the bilateral maxillary sinuses. Minimal mucosal thickening within the left sphenoid sinus. Opacification of bilateral ethmoid air cells due to the presence of mucosal thickening and fluid, overall moderate in severity. Other: Left parietal scalp laceration and hematoma (with scalp staples at this site). CT CERVICAL SPINE FINDINGS Mildly motion degraded exam. Alignment: Straightening of the expected cervical lordosis. No significant spondylolisthesis. Skull base and vertebrae: The basion-dental and atlanto-dental intervals are maintained.No evidence of acute fracture to the cervical spine. Soft tissues and spinal canal: No prevertebral fluid or swelling. No visible canal hematoma. Disc levels: Spondylosis with multilevel disc space narrowing, disc bulges/central disc protrusions,  posterior disc osteophyte complexes and uncovertebral hypertrophy. Multilevel spinal canal stenosis. Most notably, a posterior disc osteophyte complex at C3-C4 contributes to suspected severe spinal canal stenosis, and posterior disc osteophyte complexes at C4-C5 and C5-C6 contribute to at least moderate spinal canal stenosis. Multilevel bony neural foraminal narrowing. Upper chest: Addition within the imaged lung apices. No visible pneumothorax. Partially imaged left pleural effusion. Attempts are being made to reach the ordering provider at this time. IMPRESSION: CT head: 1. Subdural hematoma overlying the left cerebral hemisphere measuring up to 13 mm in thickness. This hematoma is predominantly hypodense and chronic. However, there is small-volume acute hemorrhage within this collection posteriorly. 2. Small-volume acute subdural hemorrhage along the left tentorium. 3. Chronic subdural hematomas overlying the right cerebral hemisphere, and along  the right aspect of the falx, measuring up to 11 mm in thickness. 4. The subdural hematomas exert balanced mass effect upon the underlying cerebral hemispheres without midline shift. 5. Mild cerebral atrophy. 6. Left parietal scalp hematoma and laceration (with scalp staples at this site). CT cervical spine: 1. Mildly motion degraded exam. 2. No evidence of acute fracture to the cervical spine. 3. Cervical spondylosis, as described. Notably, there is suspected severe spinal canal stenosis at C3-C4, and at least moderate spinal canal stenosis at C4-C5 and C5-C6. 4. Partially imaged left pleural effusion. Electronically Signed   By: Kellie Simmering D.O.   On: 03/09/2022 13:10   CT Cervical Spine Wo Contrast  Result Date: 03/09/2022 CLINICAL DATA:  Provided history: Polytrauma, blunt. Head trauma, moderate/severe. EXAM: CT HEAD WITHOUT CONTRAST CT CERVICAL SPINE WITHOUT CONTRAST TECHNIQUE: Multidetector CT imaging of the head and cervical spine was performed following the standard protocol without intravenous contrast. Multiplanar CT image reconstructions of the cervical spine were also generated. RADIATION DOSE REDUCTION: This exam was performed according to the departmental dose-optimization program which includes automated exposure control, adjustment of the mA and/or kV according to patient size and/or use of iterative reconstruction technique. COMPARISON:  Head CT 05/11/2014. FINDINGS: CT HEAD FINDINGS Brain: Generalized cerebral atrophy. Chronic subdural hematomas overlying the right cerebral hemisphere and along the right aspect of the falx, measuring up to 11 mm in thickness. Subdural hematoma overlying the left cerebral hemisphere measuring up to 13 mm in thickness. This subdural hematoma is predominantly hypodense and chronic. However, there is small-volume acute hemorrhage within this collection posteriorly (for instance as seen on series 3, image 20). Additionally, there is small-volume acute subdural  hemorrhage along the medial aspect of the left tentorium (series 7, image 57). The subdural hematomas exert balanced mass effect upon the underlying cerebral hemispheres without midline shift. Additionally No demarcated cortical infarct. No evidence of an intracranial mass. No midline shift. Vascular: No hyperdense vessel.  Atherosclerotic calcifications. Skull: No mass or acute finding within the imaged orbits. Bilateral parietal cranioplasties. Sinuses/Orbits: Mild mucosal thickening within the bilateral maxillary sinuses. Minimal mucosal thickening within the left sphenoid sinus. Opacification of bilateral ethmoid air cells due to the presence of mucosal thickening and fluid, overall moderate in severity. Other: Left parietal scalp laceration and hematoma (with scalp staples at this site). CT CERVICAL SPINE FINDINGS Mildly motion degraded exam. Alignment: Straightening of the expected cervical lordosis. No significant spondylolisthesis. Skull base and vertebrae: The basion-dental and atlanto-dental intervals are maintained.No evidence of acute fracture to the cervical spine. Soft tissues and spinal canal: No prevertebral fluid or swelling. No visible canal hematoma. Disc levels: Spondylosis with multilevel disc space narrowing, disc bulges/central disc protrusions, posterior disc osteophyte complexes  and uncovertebral hypertrophy. Multilevel spinal canal stenosis. Most notably, a posterior disc osteophyte complex at C3-C4 contributes to suspected severe spinal canal stenosis, and posterior disc osteophyte complexes at C4-C5 and C5-C6 contribute to at least moderate spinal canal stenosis. Multilevel bony neural foraminal narrowing. Upper chest: Addition within the imaged lung apices. No visible pneumothorax. Partially imaged left pleural effusion. Attempts are being made to reach the ordering provider at this time. IMPRESSION: CT head: 1. Subdural hematoma overlying the left cerebral hemisphere measuring up to 13  mm in thickness. This hematoma is predominantly hypodense and chronic. However, there is small-volume acute hemorrhage within this collection posteriorly. 2. Small-volume acute subdural hemorrhage along the left tentorium. 3. Chronic subdural hematomas overlying the right cerebral hemisphere, and along the right aspect of the falx, measuring up to 11 mm in thickness. 4. The subdural hematomas exert balanced mass effect upon the underlying cerebral hemispheres without midline shift. 5. Mild cerebral atrophy. 6. Left parietal scalp hematoma and laceration (with scalp staples at this site). CT cervical spine: 1. Mildly motion degraded exam. 2. No evidence of acute fracture to the cervical spine. 3. Cervical spondylosis, as described. Notably, there is suspected severe spinal canal stenosis at C3-C4, and at least moderate spinal canal stenosis at C4-C5 and C5-C6. 4. Partially imaged left pleural effusion. Electronically Signed   By: Kellie [place]mering D.O.   On: 03/09/2022 13:10   DG Chest Port 1 View  Result Date: 03/09/2022 CLINICAL DATA:  Trauma EXAM: PORTABLE CHEST 1 VIEW COMPARISON:  CXR 03/03/22 FINDINGS: Endotracheal tube terminates approximately 5 cm above the carina. Enteric tube courses below diaphragm with the tip out of the field of view. Unchanged right-sided pleural effusion. Unchanged enlarged cardiac and mediastinal contours. No pneumothorax. There are prominent bilateral interstitial opacities, unchanged from prior exam. No radiographically apparent displaced rib fractures. IMPRESSION: 1. Support apparatus as above. 2. Unchanged right-sided pleural effusion. 3. Unchanged prominent bilateral interstitial opacities. Electronically Signed   By: Marin Roberts M.D.   On: 03/09/2022 12:47   DG Pelvis Portable  Result Date: 03/09/2022 CLINICAL DATA:  Pain after fall EXAM: PORTABLE PELVIS 1 VIEWS COMPARISON:  None Available. FINDINGS: Mild axial joint space loss of the hip joints with small osteophytes. Small  osteophytes of the sacroiliac joints. Hyperostosis. No fracture or dislocation. Preserved bone mineralization. Degenerative changes of the visualized lumbar spine at the edge of the imaging field. Overlapping cardiac leads. Please correlate with numerous other CT scans from same date IMPRESSION: Degenerative changes Electronically Signed   By: Jill Side M.D.   On: 03/09/2022 12:45    Review of Systems  Unable to perform ROS: Acuity of condition   Blood pressure (!) 142/70, pulse 62, temperature 97.6 F (36.4 C), temperature source Tympanic, resp. rate 16, height 5' 10.98" (1.803 m), weight 89.4 kg, SpO2 100 %. Physical Exam Constitutional:      Comments: Patient is intubated but will open his eyes to command.  He will not follow commands but appears to be moving the lower in the upper extremities purposefully.  His pupils are 3 mm and briskly reactive light and accommodation.  There is a laceration in the parietal occipital region.  He has a hard cervical collar on but it appears that this laceration has been posed.  There is some contusion in the parietal occipital region of the scalp.  Eyes:     Extraocular Movements: Extraocular movements intact.     Conjunctiva/sclera: Conjunctivae normal.     Pupils: Pupils are  equal, round, and reactive to light.  Neurological:     Comments: See constitutional.     Assessment/Plan: Patient with a fall from standing height and laceration in the parietal occipital region.  CT of the head demonstrates that he has chronic subdural fluid collections.  There may be a very small spot of acute blood in the left posterior parietal region.  There is also a small spot of acute blood in the tentorial region midline.  No shift or mass effect.  At this point the patient would require only simple observation.  If he arouses and awakens with he may be extubated.  Blanchie Dessert Acelynn Dejonge 03/09/2022, 1:36 PM

## 2022-03-10 ENCOUNTER — Inpatient Hospital Stay (HOSPITAL_COMMUNITY): Payer: Medicare Other

## 2022-03-10 LAB — GLUCOSE, CAPILLARY
Glucose-Capillary: 100 mg/dL — ABNORMAL HIGH (ref 70–99)
Glucose-Capillary: 43 mg/dL — CL (ref 70–99)
Glucose-Capillary: 108 mg/dL — ABNORMAL HIGH (ref 70–99)
Glucose-Capillary: 69 mg/dL — ABNORMAL LOW (ref 70–99)
Glucose-Capillary: 92 mg/dL (ref 70–99)
Glucose-Capillary: 111 mg/dL — ABNORMAL HIGH (ref 70–99)
Glucose-Capillary: 171 mg/dL — ABNORMAL HIGH (ref 70–99)
Glucose-Capillary: 122 mg/dL — ABNORMAL HIGH (ref 70–99)
Glucose-Capillary: 147 mg/dL — ABNORMAL HIGH (ref 70–99)

## 2022-03-10 LAB — CBC
HCT: 23.3 % — ABNORMAL LOW (ref 39.0–52.0)
Hemoglobin: 7.9 g/dL — ABNORMAL LOW (ref 13.0–17.0)
MCH: 31.5 pg (ref 26.0–34.0)
MCHC: 33.9 g/dL (ref 30.0–36.0)
MCV: 92.8 fL (ref 80.0–100.0)
Platelets: 201 10*3/uL (ref 150–400)
RBC: 2.51 MIL/uL — ABNORMAL LOW (ref 4.22–5.81)
RDW: 14.2 % (ref 11.5–15.5)
WBC: 6.2 10*3/uL (ref 4.0–10.5)
nRBC: 0 % (ref 0.0–0.2)

## 2022-03-10 LAB — BASIC METABOLIC PANEL
Anion gap: 6 (ref 5–15)
BUN: 24 mg/dL — ABNORMAL HIGH (ref 8–23)
CO2: 22 mmol/L (ref 22–32)
Calcium: 7.3 mg/dL — ABNORMAL LOW (ref 8.9–10.3)
Chloride: 111 mmol/L (ref 98–111)
Creatinine, Ser: 2.32 mg/dL — ABNORMAL HIGH (ref 0.61–1.24)
GFR, Estimated: 28 mL/min — ABNORMAL LOW (ref >60)
Glucose, Bld: 99 mg/dL (ref 70–99)
Potassium: 3.7 mmol/L (ref 3.5–5.1)
Sodium: 139 mmol/L (ref 135–145)

## 2022-03-10 LAB — MAGNESIUM: Magnesium: 1.7 mg/dL (ref 1.7–2.4)

## 2022-03-10 LAB — PHOSPHORUS: Phosphorus: 2.8 mg/dL (ref 2.5–4.6)

## 2022-03-10 LAB — TRIGLYCERIDES: Triglycerides: 102 mg/dL (ref <150)

## 2022-03-10 MED ORDER — ALBUMIN HUMAN 25 % IV SOLN
12.5000 g | Freq: Once | INTRAVENOUS | Status: AC
  Administered 2022-03-10: 12.5 g via INTRAVENOUS
  Filled 2022-03-10: qty 50

## 2022-03-10 MED ORDER — OXYCODONE HCL 5 MG PO TABS
5.0000 mg | ORAL_TABLET | ORAL | Status: DC | PRN
  Administered 2022-03-13 – 2022-03-14 (×2): 5 mg
  Filled 2022-03-10 (×3): qty 1

## 2022-03-10 MED ORDER — PROSOURCE TF20 ENFIT COMPATIBL EN LIQD
60.0000 mL | Freq: Every day | ENTERAL | Status: DC
  Administered 2022-03-10 – 2022-03-20 (×11): 60 mL
  Filled 2022-03-10 (×11): qty 60

## 2022-03-10 MED ORDER — OSMOLITE 1.5 CAL PO LIQD
1000.0000 mL | ORAL | Status: DC
  Administered 2022-03-10 – 2022-03-16 (×7): 1000 mL

## 2022-03-10 MED ORDER — CALCIUM GLUCONATE-NACL 2-0.675 GM/100ML-% IV SOLN
2.0000 g | Freq: Once | INTRAVENOUS | Status: AC
  Administered 2022-03-10: 2000 mg via INTRAVENOUS
  Filled 2022-03-10: qty 100

## 2022-03-10 MED ORDER — POTASSIUM CHLORIDE 2 MEQ/ML IV SOLN: INTRAVENOUS | Status: DC

## 2022-03-10 MED ORDER — KCL IN DEXTROSE-NACL 20-5-0.9 MEQ/L-%-% IV SOLN
INTRAVENOUS | Status: DC
  Filled 2022-03-10 (×2): qty 1000

## 2022-03-10 MED ORDER — VITAL HIGH PROTEIN PO LIQD: 1000.0000 mL | ORAL | Status: DC

## 2022-03-10 MED ORDER — DEXTROSE 50 % IV SOLN
INTRAVENOUS | Status: AC
  Administered 2022-03-10 (×2): 25 mL
  Filled 2022-03-10: qty 50

## 2022-03-10 NOTE — Progress Notes (Signed)
Patient ID: David Mendoza, male   DOB: 28-Oct-1940, 82 y.o.   MRN: [id number] Follow up - Trauma Critical Care   Patient Details:    David Mendoza is an 82 y.o. male.  Lines/tubes : Airway 7.5 mm (Active)     Airway 8 mm (Active)  Secured at (cm) 25 cm 03/10/22 0343  Measured From Lips 03/10/22 [place] 03/10/22 0343  Secured By [employer] 03/10/22 0343  Tube Holder Repositioned Yes 03/10/22 0343  Prone position No 03/10/22 0343  Cuff Pressure (cm H2O) Clear OR 27-39 CmH2O 03/09/22 2043  Site Condition Dry 03/09/22 2043     NG/OG Vented/Dual Lumen Oral External length of tube (Active)  Tube Position (Required) External length of tube 03/09/22 2000  Ongoing Placement Verification (Required) (See row information) Yes 03/09/22 2000  Site Assessment Clean, Dry, Intact 03/09/22 2000  Status Low intermittent suction 03/09/22 2000  Drainage Appearance Bile 03/09/22 2000  Intake (mL) 100 mL 03/09/22 2200     Urethral Catheter Amy Stanley Straight-tip 16 Fr. (Active)  Indication for Insertion or Continuance of Catheter Unstable critically ill patients first 24-48 hours (See Criteria) 03/09/22 2000  Site Assessment Clean, Dry, Intact 03/09/22 2000  Catheter Maintenance Bag below level of bladder;Catheter secured;Drainage bag/tubing not touching floor;Insertion date on drainage bag;No dependent loops;Seal intact 03/09/22 2000  Collection Container Standard drainage bag 03/09/22 2000  Securement Method Securing device (Describe) 03/09/22 2000  Urinary Catheter Interventions (if applicable) Unclamped [id number] 1700  Output (mL) 20 mL 03/10/22 0600    Microbiology/Sepsis markers: Results for orders placed or performed during the hospital encounter of 03/09/22  MRSA Next Gen by PCR, Nasal     Status: None   Collection Time: 03/09/22  5:00 PM   Specimen: Nasal Mucosa; Nasal Swab  Result Value Ref Range Status   MRSA by PCR Next Gen NOT DETECTED NOT DETECTED  Final    Comment: (NOTE) The GeneXpert MRSA Assay (FDA approved for NASAL specimens only), is one component of a comprehensive MRSA colonization surveillance program. It is not intended to diagnose MRSA infection nor to guide or monitor treatment for MRSA infections. Test performance is not FDA approved in patients less than 8 years old. Performed at [facility], [place] [address] [place], [place] [address]     Anti-infectives:  Anti-infectives (From admission, onward)    Start     Dose/Rate Route Frequency Ordered Stop   03/09/22 1400  ceFAZolin (ANCEF) IVPB 2g/100 mL premix        2 g 200 mL/hr over 30 Minutes Intravenous  Once 03/09/22 1346 03/09/22 1600     Consults: Treatment Team:  Md, Trauma, MD Kristeen Miss, MD    Studies:    Events:  Subjective:    Overnight Issues:   Objective:  Vital signs for last 24 hours: Temp:  [90.3 F (32.4 C)-98.8 F (37.1 C)] 96.4 F (35.8 C) (03/01 0700) Pulse Rate:  [45-63] 60 (03/01 0700) Resp:  [10-17] 16 (03/01 0700) BP: (89-146)/(50-79) 113/57 (03/01 0700) SpO2:  [100 %] 100 % (03/01 0700) FiO2 (%):  [40 %-100 %] 40 % (03/01 0343) Weight:  [89.4 kg] 89.4 kg (02/29 1258)  Hemodynamic parameters for last 24 hours:    Intake/Output from previous day: 02/29 0701 - 03/01 0700 In: 2144.3 [I.V.:1350.9; NG/GT:100; IV Piggyback:693.4] Out: 490 [Urine:490]  Intake/Output this shift: No intake/output data recorded.  Vent settings for last 24 hours: Vent Mode: PRVC FiO2 (%):  [40 %-  100 %] 40 % Set Rate:  [16 bmp] 16 bmp Vt Set:  [600 mL] 600 mL PEEP:  [5 cmH20] 5 cmH20 Plateau Pressure:  [17 [id number] cmH20] 20 cmH20  Physical Exam:  General: on vent Neuro: pupils 18m, spont moves, not F/C HEENT/Neck: ETT Resp: clear to auscultation bilaterally CVS: RRR GI: soft, NT, ND Extremities: chronic edema BLE, wound L calf  Results for orders placed or performed during the hospital encounter of 03/09/22 (from the  past 24 hour(s))  CBC     Status: Abnormal   Collection Time: 03/09/22 12:01 PM  Result Value Ref Range   WBC 6.6 4.0 - 10.5 K/uL   RBC 2.96 (L) 4.22 - 5.81 MIL/uL   Hemoglobin 9.4 (L) 13.0 - 17.0 g/dL   HCT 28.7 (L) 39.0 - 52.0 %   MCV 97.0 80.0 - 100.0 fL   MCH 31.8 26.0 - 34.0 pg   MCHC 32.8 30.0 - 36.0 g/dL   RDW 14.0 11.5 - 15.5 %   Platelets 220 150 - 400 K/uL   nRBC 0.0 0.0 - 0.2 %  Ethanol     Status: None   Collection Time: 03/09/22 12:01 PM  Result Value Ref Range   Alcohol, Ethyl (B) <10 <10 mg/dL  Lactic acid, plasma     Status: None   Collection Time: 03/09/22 12:01 PM  Result Value Ref Range   Lactic Acid, Venous 1.1 0.5 - 1.9 mmol/L  Protime-INR     Status: None   Collection Time: 03/09/22 12:01 PM  Result Value Ref Range   Prothrombin Time 14.7 11.4 - 15.2 seconds   INR 1.2 0.8 - 1.2  Sample to Blood Bank     Status: None   Collection Time: 03/09/22 12:01 PM  Result Value Ref Range   Blood Bank Specimen SAMPLE AVAILABLE FOR TESTING    Sample Expiration      03/12/2022,2359 [facility]formed [facility], [address], [place] [id number]  I-Stat Chem 8, ED     Status: Abnormal   Collection Time: 03/09/22 12:11 PM  Result Value Ref Range   Sodium 139 135 - 145 mmol/L   Potassium 4.6 3.5 - 5.1 mmol/L   Chloride 109 98 - 111 mmol/L   BUN 34 (H) 8 - 23 mg/dL   Creatinine, Ser 2.30 (H) 0.61 - 1.24 mg/dL   Glucose, Bld 198 (H) 70 - 99 mg/dL   Calcium, Ion 0.96 (L) 1.15 - 1.40 mmol/L   TCO2 23 22 - 32 mmol/L   Hemoglobin 8.5 (L) 13.0 - 17.0 g/dL   HCT 25.0 (L) 39.0 - 52.0 %  Comprehensive metabolic panel     Status: Abnormal   Collection Time: 03/09/22  1:57 PM  Result Value Ref Range   Sodium 141 135 - 145 mmol/L   Potassium 2.0 (LL) 3.5 - 5.1 mmol/L   Chloride 123 (H) 98 - 111 mmol/L   CO2 14 (L) 22 - 32 mmol/L   Glucose, Bld 134 (H) 70 - 99 mg/dL   BUN 17 8 - 23 mg/dL   Creatinine, Ser 1.37 (H) 0.61 - 1.24 mg/dL   Calcium 4.8 (LL) 8.9 -  10.3 mg/dL   Total Protein <3.0 (L) 6.5 - 8.1 g/dL   Albumin <1.5 (L) 3.5 - 5.0 g/dL   AST 12 (L) 15 - 41 U/L   ALT 13 0 - 44 U/L   Alkaline Phosphatase 58 38 - 126 U/L   Total Bilirubin 0.4 0.3 -  1.2 mg/dL   GFR, Estimated 52 (L) >60 mL/min   Anion gap 4 (L) 5 - 15  I-Stat arterial blood gas, ED     Status: Abnormal   Collection Time: 03/09/22  2:11 PM  Result Value Ref Range   pH, Arterial 7.353 7.35 - 7.45   pCO2 arterial 39.0 32 - 48 mmHg   pO2, Arterial 509 (H) 83 - 108 mmHg   Bicarbonate 21.7 20.0 - 28.0 mmol/L   TCO2 23 22 - 32 mmol/L   O2 Saturation 100 %   Acid-base deficit 4.0 (H) 0.0 - 2.0 mmol/L   Sodium 141 135 - 145 mmol/L   Potassium 3.0 (L) 3.5 - 5.1 mmol/L   Calcium, Ion 1.16 1.15 - 1.40 mmol/L   HCT 22.0 (L) 39.0 - 52.0 %   Hemoglobin 7.5 (L) 13.0 - 17.0 g/dL   Sample type ARTERIAL   MRSA Next Gen by PCR, Nasal     Status: None   Collection Time: 03/09/22  5:00 PM   Specimen: Nasal Mucosa; Nasal Swab  Result Value Ref Range   MRSA by PCR Next Gen NOT DETECTED NOT DETECTED  Urinalysis, Routine w reflex microscopic -Urine, Clean Catch     Status: Abnormal   Collection Time: 03/09/22  6:17 PM  Result Value Ref Range   Color, Urine STRAW (A) YELLOW   APPearance CLEAR CLEAR   Specific Gravity, Urine 1.020 1.005 - 1.030   pH 6.0 5.0 - 8.0   Glucose, UA >=500 (A) NEGATIVE mg/dL   Hgb urine dipstick SMALL (A) NEGATIVE   Bilirubin Urine NEGATIVE NEGATIVE   Ketones, ur NEGATIVE NEGATIVE mg/dL   Protein, ur >300 (A) NEGATIVE mg/dL   Nitrite NEGATIVE NEGATIVE   Leukocytes,Ua NEGATIVE NEGATIVE  Urinalysis, Microscopic (reflex)     Status: None   Collection Time: 03/09/22  6:17 PM  Result Value Ref Range   RBC / HPF 0-5 0 - 5 RBC/hpf   WBC, UA 0-5 0 - 5 WBC/hpf   Bacteria, UA NONE SEEN NONE SEEN   Squamous Epithelial / HPF 0-5 0 - 5 /HPF   Mucus PRESENT   Glucose, capillary     Status: Abnormal   Collection Time: 03/09/22  8:13 PM  Result Value Ref Range    Glucose-Capillary 183 (H) 70 - 99 mg/dL  Glucose, capillary     Status: Abnormal   Collection Time: 03/09/22 11:03 PM  Result Value Ref Range   Glucose-Capillary 124 (H) 70 - 99 mg/dL  Glucose, capillary     Status: Abnormal   Collection Time: 03/10/22  3:12 AM  Result Value Ref Range   Glucose-Capillary 43 (LL) 70 - 99 mg/dL   Comment 1 Notify RN    Comment 2 Document in Chart   Glucose, capillary     Status: Abnormal   Collection Time: 03/10/22  3:28 AM  Result Value Ref Range   Glucose-Capillary 100 (H) 70 - 99 mg/dL  Glucose, capillary     Status: Abnormal   Collection Time: 03/10/22  5:56 AM  Result Value Ref Range   Glucose-Capillary 69 (L) 70 - 99 mg/dL  Glucose, capillary     Status: Abnormal   Collection Time: 03/10/22  6:13 AM  Result Value Ref Range   Glucose-Capillary 108 (H) 70 - 99 mg/dL  CBC     Status: Abnormal   Collection Time: 03/10/22  6:46 AM  Result Value Ref Range   WBC 6.2 4.0 - 10.5 K/uL   RBC 2.51 (  L) 4.22 - 5.81 MIL/uL   Hemoglobin 7.9 (L) 13.0 - 17.0 g/dL   HCT 23.3 (L) 39.0 - 52.0 %   MCV 92.8 80.0 - 100.0 fL   MCH 31.5 26.0 - 34.0 pg   MCHC 33.9 30.0 - 36.0 g/dL   RDW 14.2 11.5 - 15.5 %   Platelets 201 150 - 400 K/uL   nRBC 0.0 0.0 - 0.2 %    Assessment & Plan: Present on Admission:  TBI (traumatic brain injury) ([place])    LOS: 1 day   Additional comments:I reviewed the patient's new clinical lab test results. And CT H Fall Bilateral SDH, falcine hematoma - NSGY Dr. Ellene Route consulted. Suspects mostly chronic small acute component with h/o craniotomy 2016 for b/l SDH. Keppra for seizure prophylaxis. Repeat CT head overnight stable Scalp laceration with hematoma - stapled by EDP. Removal in 10-14 days Acute hypoxic ventilator dependent respiratory failure - begin weaning trials Pancreatic duct stricture - incidental finding. Will need outpatient follow up HTN H/o multiple falls - not on anticoagulation currently per med list review CT c  spine negative - D/C collar  FEN: hypoglycemia and hypokalemia - change IVF to D50.9NS with 20K and start TF ID: ancef in ED VTE: SCDs. Plan start LMWH 3/3 (48H after stable F/U CT H) Foley - Place for I/O Dispo: ICU, begin weaning Critical Care Total Time*: 40 Minutes  Georganna Skeans, MD, MPH, FACS Trauma & General Surgery Use [email or web address] to contact on call provider  03/10/2022  *Care during the described time interval was provided by me. I have reviewed this patient's available data, including medical history, events of note, physical examination and test results as part of my evaluation.

## 2022-03-10 NOTE — Progress Notes (Signed)
Initial Nutrition Assessment  DOCUMENTATION CODES:  Not applicable  INTERVENTION:  Initiate tube feeding via OGT: Osmolite 1.5 at 55 ml/h (1320 ml per day) Prosource TF20 60 ml 1x/d Provides 2060 kcal, 103 gm protein, 1006 ml free water daily  NUTRITION DIAGNOSIS:  Inadequate oral intake related to inability to eat as evidenced by NPO status.  GOAL:  Patient will meet greater than or equal to 90% of their needs  MONITOR:  TF tolerance, Skin, I & O's, Vent status, Labs  REASON FOR ASSESSMENT:  Consult Enteral/tube feeding initiation and management  ASSESSMENT:  Pt with hx of HTN, HLD, and DM type 2 presented to ED after a fall from standing height where he struck his head. Intubated on arrival to ED.  Patient is currently intubated on ventilator support. OGT in place, no KUB but chest XR notes tip passes below the diaphragm. Discussed with RN.  No family at bedside at the time of assessment. Overall, pt appears well nourished but there is significant edema present to the BLE which could be masking some loss.    MV: 9.4 L/min Temp (24hrs), Avg:96.3 F (35.7 C), Min:90.3 F (32.4 C), Max:98.8 F (37.1 C)  Propofol: 10.73 ml/hr (283 kcal/d)   Intake/Output Summary (Last 24 hours) at 03/10/2022 1447 Last data filed at 03/10/2022 0948 Gross per 24 hour  Intake 2144.31 ml  Output 500 ml  Net 1644.31 ml  Net IO Since Admission: 1,644.31 mL [03/10/22 1447]  Nutritionally Relevant Medications: Scheduled Meds:  docusate  100 mg Per Tube BID   PROSource TF20  60 mL Per Tube Daily   VITAL HIGH PROTEIN  1,000 mL Per Tube Q24H   insulin aspart  2-6 Units Subcutaneous Q4H   pantoprazole IV  40 mg Intravenous QHS   polyethylene glycol  17 g Per Tube Daily   Continuous Infusions:  dextrose 5 % and 0.9 % NaCl with KCl 20 mEq/L     propofol (DIPRIVAN) infusion 20 mcg/kg/min (03/10/22 0700)   PRN Meds: ondansetron  Labs Reviewed: BUN 24, creatinine 2.32 CBG ranges from 43-111  mg/dL over the last 24 hours   NUTRITION - FOCUSED PHYSICAL EXAM: Flowsheet Row Most Recent Value  Orbital Region No depletion  Upper Arm Region No depletion  Thoracic and Lumbar Region Mild depletion  Buccal Region No depletion  Temple Region No depletion  Clavicle Bone Region Mild depletion  Clavicle and Acromion Bone Region Mild depletion  Scapular Bone Region Unable to assess  Dorsal Hand Unable to assess  Patellar Region Moderate depletion  Anterior Thigh Region Moderate depletion  Posterior Calf Region Mild depletion  [edema]  Edema (RD Assessment) Moderate  [feet, BLE (pitting) to the knees]  Hair Reviewed  Eyes Reviewed  Mouth Reviewed  Skin Reviewed  Nails Reviewed   Diet Order:   Diet Order             Diet NPO time specified  Diet effective now                   EDUCATION NEEDS:  Not appropriate for education at this time  Skin:  Skin Assessment: Reviewed RN Assessment (laceration to the head)  Last BM:  unsure  Height:  Ht Readings from Last 1 Encounters:  03/09/22 5' 10.98" (1.803 m)    Weight:  Wt Readings from Last 1 Encounters:  03/09/22 89.4 kg    Ideal Body Weight:  78.2 kg  BMI:  Body mass index is 27.5 kg/m.  Estimated  Nutritional Needs:  Kcal:  1900-2100 kcal/d Protein:  100-115g/d Fluid:  >/=2L/d    Ranell Patrick, RD, LDN Clinical Dietitian RD pager # available in AMION  After hours/weekend pager # available in [facility]

## 2022-03-10 NOTE — Progress Notes (Signed)
Latest Reference Range & Units 03/10/22 03:12 03/10/22 03:28  Glucose-Capillary 70 - 99 mg/dL 43 (LL) 100   (LL): Data is critically low  Pt CBG LOW this morning at 43. Half amp of D50 given and CBG up to 100. Will recheck in 1 to 2 hrs. MD will evaluate.

## 2022-03-10 NOTE — Progress Notes (Signed)
RT x2, RN and CNA transported pt to CT and back with no complications.

## 2022-03-10 NOTE — TOC CAGE-AID Note (Signed)
Transition of Care [facility]) - CAGE-AID Screening   Patient Details  Name: David Mendoza MRN: [id number] Date of Birth: [birth year]  Transition of Care [facility]) CM/SW Contact:    Clovis Cao, RN Phone Number: [phone] 03/10/2022, 3:37 PM   Clinical Narrative: Pt here after sustaining a traumatic SDH due to a fall.  Pt is intubated and unable to [facility] at this time.  Screening unable to be complete.   CAGE-AID Screening: Substance Abuse Screening unable to be completed due to: : Patient unable to participate

## 2022-03-10 NOTE — Progress Notes (Signed)
Patient ID: David Mendoza, male   DOB: 14-Oct-1940, 82 y.o.   MRN: [id number] CT this am remains stable. Patient remains intubated, some purposeful movement but not following commands. May ask neurology to evaluate.

## 2022-03-10 NOTE — Progress Notes (Signed)
Patient ID: David Mendoza, male   DOB: 07/02/1940, 82 y.o.   MRN: [id number] I updated his wife at the bedside. She reports he has had a few episodes of hallucinations over the recent weeks. This was all prior to his fall. I outlined the TBI team therapy process and the cognitive eval that is part of that.  Georganna Skeans, MD, MPH, FACS Please use [email or web address] to contact on call provider

## 2022-03-11 ENCOUNTER — Inpatient Hospital Stay (HOSPITAL_COMMUNITY): Payer: Medicare Other

## 2022-03-11 DIAGNOSIS — S065XAA Traumatic subdural hemorrhage with loss of consciousness status unknown, initial encounter: Secondary | ICD-10-CM | POA: Diagnosis not present

## 2022-03-11 DIAGNOSIS — R4182 Altered mental status, unspecified: Secondary | ICD-10-CM | POA: Diagnosis not present

## 2022-03-11 DIAGNOSIS — S069X9A Unspecified intracranial injury with loss of consciousness of unspecified duration, initial encounter: Secondary | ICD-10-CM

## 2022-03-11 LAB — GLUCOSE, CAPILLARY
Glucose-Capillary: 176 mg/dL — ABNORMAL HIGH (ref 70–99)
Glucose-Capillary: 178 mg/dL — ABNORMAL HIGH (ref 70–99)
Glucose-Capillary: 102 mg/dL — ABNORMAL HIGH (ref 70–99)
Glucose-Capillary: 126 mg/dL — ABNORMAL HIGH (ref 70–99)
Glucose-Capillary: 162 mg/dL — ABNORMAL HIGH (ref 70–99)
Glucose-Capillary: 177 mg/dL — ABNORMAL HIGH (ref 70–99)
Glucose-Capillary: 135 mg/dL — ABNORMAL HIGH (ref 70–99)

## 2022-03-11 LAB — MAGNESIUM
Magnesium: 1.8 mg/dL (ref 1.7–2.4)
Magnesium: 1.8 mg/dL (ref 1.7–2.4)

## 2022-03-11 LAB — FOLATE: Folate: 5 ng/mL — ABNORMAL LOW (ref >5.9)

## 2022-03-11 LAB — CBC
HCT: 23.4 % — ABNORMAL LOW (ref 39.0–52.0)
Hemoglobin: 7.4 g/dL — ABNORMAL LOW (ref 13.0–17.0)
MCH: 30.5 pg (ref 26.0–34.0)
MCHC: 31.6 g/dL (ref 30.0–36.0)
MCV: 96.3 fL (ref 80.0–100.0)
Platelets: 183 10*3/uL (ref 150–400)
RBC: 2.43 MIL/uL — ABNORMAL LOW (ref 4.22–5.81)
RDW: 14.7 % (ref 11.5–15.5)
WBC: 6.1 10*3/uL (ref 4.0–10.5)
nRBC: 0 % (ref 0.0–0.2)

## 2022-03-11 LAB — BASIC METABOLIC PANEL
Anion gap: 8 (ref 5–15)
BUN: 31 mg/dL — ABNORMAL HIGH (ref 8–23)
CO2: 20 mmol/L — ABNORMAL LOW (ref 22–32)
Calcium: 7.5 mg/dL — ABNORMAL LOW (ref 8.9–10.3)
Chloride: 112 mmol/L — ABNORMAL HIGH (ref 98–111)
Creatinine, Ser: 2.74 mg/dL — ABNORMAL HIGH (ref 0.61–1.24)
GFR, Estimated: 23 mL/min — ABNORMAL LOW (ref >60)
Glucose, Bld: 186 mg/dL — ABNORMAL HIGH (ref 70–99)
Potassium: 4.5 mmol/L (ref 3.5–5.1)
Sodium: 140 mmol/L (ref 135–145)

## 2022-03-11 LAB — AMMONIA: Ammonia: 28 umol/L (ref 9–35)

## 2022-03-11 LAB — PHOSPHORUS
Phosphorus: 2.9 mg/dL (ref 2.5–4.6)
Phosphorus: 3 mg/dL (ref 2.5–4.6)

## 2022-03-11 LAB — VITAMIN B12: Vitamin B-12: 1016 pg/mL — ABNORMAL HIGH (ref 180–914)

## 2022-03-11 LAB — TSH: TSH: 1.37 u[IU]/mL (ref 0.350–4.500)

## 2022-03-11 MED ORDER — LACTATED RINGERS IV SOLN: INTRAVENOUS | Status: DC

## 2022-03-11 MED ORDER — THIAMINE MONONITRATE 100 MG PO TABS
100.0000 mg | ORAL_TABLET | Freq: Every day | ORAL | Status: DC
  Administered 2022-03-11 – 2022-03-20 (×10): 100 mg
  Filled 2022-03-11 (×10): qty 1

## 2022-03-11 MED ORDER — FOLIC ACID 1 MG PO TABS
1.0000 mg | ORAL_TABLET | Freq: Every day | ORAL | Status: DC
  Administered 2022-03-11 – 2022-03-20 (×10): 1 mg
  Filled 2022-03-11 (×10): qty 1

## 2022-03-11 MED ORDER — LACTATED RINGERS IV BOLUS
1000.0000 mL | Freq: Once | INTRAVENOUS | Status: AC
  Administered 2022-03-11: 1000 mL via INTRAVENOUS

## 2022-03-11 NOTE — Progress Notes (Signed)
  NEUROSURGERY PROGRESS NOTE   No issues overnight.   EXAM:  BP (!) 117/57 (BP Location: Left Arm)   Pulse 60   Temp 98.2 F (36.8 C) (Esophageal)   Resp 14   Ht 5' 10.98" (1.803 m)   Wt 93.1 kg   SpO2 100%   BMI 28.64 kg/m   Off propofol: No eye opening to pain Pupils reactive No motor responses to central pain   IMPRESSION:  82 y.o. male s/p fall with severe encephalopathy of unclear etiology. CTH with bilateral chronic SDH with mild associated mass effect but would not expect that to be there cause of his condition.  PLAN: - Cont supportive care per trauma - Appreciate neurology input   Consuella Lose, MD [facility]

## 2022-03-11 NOTE — Progress Notes (Signed)
Neurology Progress Note  Brief HPI: 82 year old patient with history of traumatic subdural hematoma with craniotomy in 2016, DVT, hypertension, hyperlipidemia and diabetes with retinopathy and neuropathy presented after a fall from standing height in which she struck the back of his head.  After his fall, he was initially alert and oriented x 3 then became poorly responsive with posturing upon arrival to the ED.  CT head demonstrated chronic 13 mm subdural hematoma over lying left cerebral hemisphere with small volume acute subdural hematoma along left tentorium.  Patient was intubated for airway protection.  Subjective: Patient is intubated and sedated.  With sedation paused, he will grimace and open eyes to noxious stimuli but does not display purposeful movement or follow commands.  Exam: Vitals:   03/11/22 1600 03/11/22 1700  BP: (!) 125/52 (!) 120/47  Pulse: 61 63  Resp: 16 16  Temp: 97.7 F (36.5 C) 98.2 F (36.8 C)  SpO2: 100% 100%   Gen: In bed, NAD Resp: non-labored breathing, no acute distress Abd: soft, nt  Neuro (sedation with fentanyl and propofol paused for 10 minutes): Patient is unable to respond to name or follow commands.  He will open his eyes and grimace to vigorous sternal rub.  Pupils equal round and reactive, oculocephalic reflex intact, corneal reflex intact, cough and gag intact.  Will flicker bilateral lower extremities to noxious stimuli, will not respond to noxious stimuli in bilateral upper extremities  Pertinent Labs:    Latest Ref Rng & Units 03/11/2022    5:51 AM 03/10/2022    6:46 AM 03/09/2022    2:11 PM  CBC  WBC 4.0 - 10.5 K/uL 6.1  6.2    Hemoglobin 13.0 - 17.0 g/dL 7.4  7.9  7.5   Hematocrit 39.0 - 52.0 % 23.4  23.3  22.0   Platelets 150 - 400 K/uL 183  201         Latest Ref Rng & Units 03/11/2022    9:30 AM 03/10/2022    6:46 AM 03/09/2022    2:11 PM  BMP  Glucose 70 - 99 mg/dL 186  99    BUN 8 - 23 mg/dL 31  24    Creatinine 0.61 - 1.24 mg/dL  2.74  2.32    Sodium 135 - 145 mmol/L 140  139  141   Potassium 3.5 - 5.1 mmol/L 4.5  3.7  3.0   Chloride 98 - 111 mmol/L 112  111    CO2 22 - 32 mmol/L 20  22    Calcium 8.9 - 10.3 mg/dL 7.5  7.3    Folate 5.0 Thiamine in process TSH 1.370 Ammonia 28 B12 1016  Imaging Reviewed:  CT head 2/29: Subdural hematoma overlying left cerebral hemisphere measuring up to 13 mm in thickness, likely chronic with small volume acute hemorrhage in this collection, chronic subdural hematomas overlying right cerebral hemisphere, mild cerebral atrophy and left parietal scalp hematoma and laceration  CT cervical spine 2/29: No evidence of acute fracture  CT head 3/1: Similar size of bilateral subdural collections, no acute intracranial abnormalities  EEG 3/2 continuous slow, generalized and background suppression, generalized, suggestive of severe to profound diffuse encephalopathy  Assessment: 82 year old patient with history of traumatic subdural hematoma with craniotomy in 2016, DVT, hypertension, hyperlipidemia and diabetes with retinopathy and neuropathy scented after a fall from standing height in which she struck his head.  He was initially alert and oriented after the fall but then became poorly responsive with posturing upon arrival to  the ED.  He was then intubated for airway protection.  CT head demonstrates acute on chronic subdural hematoma.  Per RN, patient has not been moving spontaneously or following commands today, even when sedation was off.  When propofol was discontinued he became dyssynchronous with the ventilator.  EEG reveals no seizure activity.  Will continue EEG and try to wean sedation and likely pursue brain MRI if poor mental status persists when sedation weaned.  May also be a component of delirium and toxic metabolic encephalopathy given increased BUN and creatinine.  Will supplement folate and thiamine given low folate level and pending thiamine level  Impression: Acute on  chronic traumatic subdural hematoma, as well as likely toxic metabolic encephalopathy  Recommendations: 1) attempt to wean sedation and see if neuro exam improves 2) continue Keppra 500 mg every 12 hours given subdural hematomas 3) continue long-term EEG until after sedation weaned off 4) folate 1 mg daily and thiamine 100 mg daily 5) appreciate neurosurgery input  Golden , MSN, AGACNP-BC [facility] See Amion for schedule and pager information 03/11/2022 5:35 PM   Attending Neurohospitalist Addendum Patient seen and examined with APP/Resident. Agree with the history and physical as documented above. Agree with the plan as documented, which I helped formulate. I have edited the note above to reflect my full findings and recommendations. I have independently reviewed the chart, obtained history, review of systems and examined the patient.I have personally reviewed pertinent head/neck/spine imaging (CT/MRI). Please feel free to call with any questions.  This patient is critically ill and at significant risk of neurological worsening, death and care requires constant monitoring of vital signs, hemodynamics,respiratory and cardiac monitoring, neurological assessment, discussion with family, other specialists and medical decision making of high complexity. I spent 45 minutes of neurocritical care time  in the care of  this patient. This was time spent independent of any time provided by nurse practitioner or PA.  Su Monks, MD [facility] [phone]  If 7pm- 7am, please page neurology on call as listed in [facility] [facility].

## 2022-03-11 NOTE — Procedures (Addendum)
Patient Name: David Mendoza  MRN: [id number]  Epilepsy Attending: Lora Havens  Referring Physician/Provider: Donnetta Simpers, MD  Duration: 03/11/2022 [id number] 03/12/2022 [id number]  Patient history:  82 y.o. 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 CT revealed chronic 4m SDH overlying L cerebral hemisphre along with small volume acute SDH along left tentorium. Repeat CT stable. EEG to evaluate for seizure  Level of alertness: lethargic   AEDs during EEG study: LEV, propofol  Technical aspects: This EEG study was done with scalp electrodes positioned according to the 10-20 International system of electrode placement. Electrical activity was reviewed with band pass filter of 1-'70Hz'$ , sensitivity of 7 uV/mm, display speed of 369msec with a '60Hz'$  notched filter applied as appropriate. EEG data were recorded continuously and digitally stored.  Video monitoring was available and reviewed as appropriate.  Description: EEG showed near continuous generalized low amplitude 3 to 5 Hz theta-delta slowing. Brief 1-3 seconds of generalized eeg suppression was also noted. As sedation was weaned after around 0800 on 03/11/2022, triphasic waves were noted at times. Hyperventilation and photic stimulation were not performed.     Event button was pressed on 03/11/2022 at 2233 for seizure-like movements, bilateral eye movements and stretched out left arm. Concomitant eeg before, during and after the event didn't show any eeg change to suggest seizure.   Event button was pressed on 03/12/2022 at 0101 for unclear reasons. Concomitant eeg before, during and after the event didn't show any eeg change to suggest seizure.   ABNORMALITY -  Continuous slow, generalized - Background suppression, generalized  IMPRESSION: This study was initially suggestive of severe to profound diffuse encephalopathy. As sedation was weaned, EEG was suggestive of severe diffuse encephalopathy. No seizures or epileptiform discharges were seen throughout the recording.  Two events were recorded as described above without concomitant eeg change. These events were most likely not epileptic.  [name] O [name]

## 2022-03-11 NOTE — Progress Notes (Signed)
Pt had seizure-like movts bilateral eye movements and stretched out left arm. Dr Lorrin Goodell notified and is evaluating the current EEG at this time. Will cotinue to monitor and carryout interventions as needed.

## 2022-03-11 NOTE — Progress Notes (Signed)
LTM EEG hooked up and running - no initial skin breakdown - push button tested - [facility] monitoring.  

## 2022-03-11 NOTE — Consult Note (Signed)
NEUROLOGY CONSULTATION NOTE   Date of service: March 11, 2022 Patient Name: David Mendoza MRN:  [id number] DOB:  19-Dec-1940 Reason for consult: "Poor neuro exam" Requesting Provider: Md, Trauma, MD _ _ _   _ __   _ __ _ _  __ __   _ __   __ _  History of Present Illness  David Mendoza is a 82 y.o. male with PMH significant for traumatic SDH s/p craniotomy in 2016, DVT, HTN, HLD, DM2 complicated by retinopathy and neuropathy who was stepping from curb, fell from standing height, hit the back of his head and initially A + O x 3, then became poorly responsive and posturing on arrival to the ED.   CT revealed chronic 72m SDH overlying L cerebral hemisphre along with small volume acute SDH along left tentorium. Repeat CT stable.  He is currently intubated and on propofol 30.  Neurology consulted for poor mentation, some spontaneous and purposeful movements but not following commands.  Patient unable to provide any history secondary to obtunded mentation  ROS   Unable to obtain ROS 2/2 obtunded mentation.  Past History   Past Medical History:  Diagnosis Date   Chronic anticoagulation    Diabetic retinopathy ([facility]    DVT (deep venous thrombosis) (H[facility])    x2   Hyperlipemia    Hypertension    Multiple falls    Nuclear sclerotic cataract of left eye 12/25/2019   Nuclear sclerotic cataract of right eye 12/25/2019   Peripheral neuropathy    Posterior vitreous detachment of left eye 12/25/2019   Sciatica    Past Surgical History:  Procedure Laterality Date   CRANIOTOMY Bilateral 04/07/2014   Procedure: BILATERAL CRANIOTOMY HEMATOMA EVACUATION SUBDURAL;  Surgeon: RKarie Chimera MD;  [facility];  Service: Neurosurgery;  Laterality: Bilateral;   PILONIDAL CYST EXCISION     No family history on file. Social History   Socioeconomic History   Marital status: Married    Spouse name: David Mendoza   Number of children: Not on file   Years of education: Not on file   Highest  education level: Not on file  Occupational History   Not on file  Tobacco Use   Smoking status: Former    Types: Cigarettes   Smokeless tobacco: Never   Tobacco comments:    Quit smoking 50 yrs ago  Vaping Use   Vaping Use: Never used  Substance and Sexual Activity   Alcohol use: Yes    Comment: occas   Drug use: Never   Sexual activity: Not on file  Other Topics Concern   Not on file  Social History Narrative   ** Merged History Encounter **       Lives with wife   Social Determinants of Health   Financial Resource Strain: Not on file  Food Insecurity: No Food Insecurity (01/15/2022)   Hunger Vital Sign    Worried About Running Out of Food in the Last Year: Never true    Ran Out of Food in the Last Year: Never true  Transportation Needs: No Transportation Needs (01/15/2022)   PRAPARE - THydrologist(Medical): No    Lack of Transportation (Non-Medical): No  Physical Activity: Not on file  Stress: Not on file  Social Connections: Not on file   Allergies  Allergen Reactions   Sulfa Antibiotics Diarrhea    Medications   Medications Prior to Admission  Medication Sig Dispense Refill Last Dose  acetaminophen (TYLENOL) 500 MG tablet Take 1,000 mg by mouth every 6 (six) hours as needed for mild pain.      amLODipine (NORVASC) 5 MG tablet Take 5 mg by mouth daily.      carvedilol (COREG) 25 MG tablet Take 25 mg by mouth 2 (two) times daily.      Cyanocobalamin (B-12) 1000 MCG TBCR Take 1 tablet by mouth daily.      ferrous gluconate (FERGON) 324 MG tablet Take 324 mg by mouth every other day.      folic acid (FOLVITE) Q000111Q MCG tablet Take 800 mcg by mouth daily. (Patient not taking: Reported on 01/17/2022)      furosemide (LASIX) 20 MG tablet Take 20 mg by mouth every other day.      glipiZIDE (GLUCOTROL XL) 2.5 MG 24 hr tablet Take 2.5 mg by mouth in the morning and at bedtime.      losartan (COZAAR) 100 MG tablet Take 100 mg by mouth daily.       metFORMIN (GLUCOPHAGE) 500 MG tablet Take 500 mg by mouth 2 (two) times daily with a meal.      omeprazole (PRILOSEC) 40 MG capsule Take 40 mg by mouth daily.      OVER THE COUNTER MEDICATION Take 1 tablet by mouth daily. Juice Plus veggie and garden blend      sertraline (ZOLOFT) 50 MG tablet Take 75 mg by mouth daily.      simvastatin (ZOCOR) 40 MG tablet Take 40 mg by mouth every evening.        Vitals   Vitals:   03/10/22 1830 03/10/22 1900 03/10/22 1958 03/10/22 2356  BP: (!) 108/50 (!) 115/52    Pulse: 60 60    Resp: 16 16    Temp: (!) 97.5 F (36.4 C) (!) 97.3 F (36.3 C)    TempSrc:      SpO2: 100% 100% 100% 100%  Weight:      Height:         Body mass index is 27.5 kg/m.  Physical Exam performed while on propofol and fentanyl   General: frail old male, intubated in bed; in no acute distress.  HENT: Normal oropharynx and mucosa. Normal external appearance of ears and nose. Neck: Supple, no pain or tenderness  CV: No JVD. No peripheral edema.  Pulmonary: Symmetric Chest rise. Intubated. Abdomen: Soft to touch, non-tender.  Ext: No cyanosis, edema, or deformity  Skin: No rash. Normal palpation of skin.   Musculoskeletal: Normal digits and nails by inspection. No clubbing.   Neurologic Examination  Mental status/Cognition: no response to loud voice, grimaces to vigorous tactile stim or to noxious stimuli. Speech/language: mentation precludes evaluation of speech. Mute, no attempts to communicate, does not follow commands. Cranial nerves:   CN II Pupils equal and reactive to light, unable to assess for VF deficits   CN III,IV,VI EOM intact to dolls eyes, no gaze preference or deviation   CN V Corneals intact BL   CN VII Symmetric facial grimace to noxious stimuli   CN VIII Does not tyurn head towards speech   CN IX & X Cough intact, weak gag.   CN XI Head mildine.   CN XII midline tongue but does not protrude on command.   Motor/sensory:  Muscle bulk: normal,  tone flaccid in all extremities. Withdraws all extremities to proximal pinch with facial grimacing, no spontaneous movements noted.  Coordination/Complex Motor:  - unable to assess.  Labs   CBC:  Recent Labs  Lab 03/09/22 1201 03/09/22 1211 03/09/22 1411 03/10/22 0646  WBC 6.6  --   --  6.2  HGB 9.4*   < > 7.5* 7.9*  HCT 28.7*   < > 22.0* 23.3*  MCV 97.0  --   --  92.8  PLT 220  --   --  201   < > = values in this interval not displayed.    Basic Metabolic Panel:  Lab Results  Component Value Date   NA 139 03/10/2022   K 3.7 03/10/2022   CO2 22 03/10/2022   GLUCOSE 99 03/10/2022   BUN 24 (H) 03/10/2022   CREATININE 2.32 (H) 03/10/2022   CALCIUM 7.3 (L) 03/10/2022   GFRNONAA 28 (L) 03/10/2022   GFRAA 68 (L) 04/07/2014   Lipid Panel: No results found for: "LDLCALC" HgbA1c:  Lab Results  Component Value Date   HGBA1C 8.5 (H) 01/15/2022   Urine Drug Screen: No results found for: "LABOPIA", "COCAINSCRNUR", "LABBENZ", "AMPHETMU", "THCU", "LABBARB"  Alcohol Level     Component Value Date/Time   ETH <10 03/09/2022 1201    CT Head without contrast(Personally reviewed): 1. Subdural hematoma overlying the left cerebral hemisphere measuring up to 13 mm in thickness. This hematoma is predominantly hypodense and chronic. However, there is small-volume acute hemorrhage within this collection posteriorly. 2. Small-volume acute subdural hemorrhage along the left tentorium. 3. Chronic subdural hematomas overlying the right cerebral hemisphere, and along the right aspect of the falx, measuring up to 11 mm in thickness. 4. The subdural hematomas exert balanced mass effect upon the underlying cerebral hemispheres without midline shift. 5. Mild cerebral atrophy. 6. Left parietal scalp hematoma and laceration (with scalp staples at this site).  Repeat CT Head without contrast(Personally reviewed): Stable.  cEEG:  pending  Impression   INDERPAL MATKIN is a 82 y.o. male  with PMH significant for with PMH significant for traumatic SDH s/p craniotomy in 2016, DVT, HTN, HLD, DM2 complicated by retinopathy and neuropathy, CKD 3b who was stepping from curb, fell from standing height, hit the back of his head and initially A + O x 3, then became poorly responsive and posturing on arrival to the ED.   CT revealed chronic 18m SDH overlying L cerebral hemisphre along with small volume acute SDH along left tentorium. Repeat CT stable.  No obvious seizure activity noted on my evaluation but given concern for earlier posturing after he was noted to be A+O x 3, along with the noted acute on chronic SDH, will hook him up to LTM to rule out subclinical seizures/status.  Suspect significant encephalopathy is likely multifactorial from potential TBI, acute on chronic SDH, advanced age, significant sedation from propofol and fentanyl along with poor clearance 2/2 baselien CKD3b.  No obvious fevers, or leukocytosis concerning for meningitis.  Recommendations  - LTM EEG - Wean sedation as able. Keep LTM EEG on while sedation is being weaned off and continue for atleast a few hours after sedation has been completely weaned off. - continue Keppra '500mg'$  BID for now. - TSH, B12, Folate, Ammonia, Thiamine levels. - Thiamine '100mg'$  daily. - MRI Brain w/o contrast if no improvement with weaning sedation. ______________________________________________________________________   Thank you for the opportunity to take part in the care of this patient. If you have any further questions, please contact the neurology consultation attending.  Signed,  SFuller HeightsPager Number [phone]_ _ _   _ __   _ __ _ _  __ __  _ __   __ _  

## 2022-03-11 NOTE — Progress Notes (Signed)
Patient ID: David Mendoza, male   DOB: 24-Sep-1940, 82 y.o.   MRN: [id number] Follow up - Trauma Critical Care   Patient Details:    David Mendoza is an 82 y.o. male.  Lines/tubes : Airway 7.5 mm (Active)     Airway 8 mm (Active)  Secured at (cm) 25 cm 03/10/22 0343  Measured From Lips 03/10/22 [place] 03/10/22 0343  Secured By [employer] 03/10/22 0343  Tube Holder Repositioned Yes 03/10/22 0343  Prone position No 03/10/22 0343  Cuff Pressure (cm H2O) Clear OR 27-39 CmH2O 03/09/22 2043  Site Condition Dry 03/09/22 2043     NG/OG Vented/Dual Lumen Oral External length of tube (Active)  Tube Position (Required) External length of tube 03/09/22 2000  Ongoing Placement Verification (Required) (See row information) Yes 03/09/22 2000  Site Assessment Clean, Dry, Intact 03/09/22 2000  Status Low intermittent suction 03/09/22 2000  Drainage Appearance Bile 03/09/22 2000  Intake (mL) 100 mL 03/09/22 2200     Urethral Catheter Amy Stanley Straight-tip 16 Fr. (Active)  Indication for Insertion or Continuance of Catheter Unstable critically ill patients first 24-48 hours (See Criteria) 03/09/22 2000  Site Assessment Clean, Dry, Intact 03/09/22 2000  Catheter Maintenance Bag below level of bladder;Catheter secured;Drainage bag/tubing not touching floor;Insertion date on drainage bag;No dependent loops;Seal intact 03/09/22 2000  Collection Container Standard drainage bag 03/09/22 2000  Securement Method Securing device (Describe) 03/09/22 2000  Urinary Catheter Interventions (if applicable) Unclamped Q000111Q 1700  Output (mL) 20 mL 03/10/22 0600    Microbiology/Sepsis markers: Results for orders placed or performed during the hospital encounter of 03/09/22  MRSA Next Gen by PCR, Nasal     Status: None   Collection Time: 03/09/22  5:00 PM   Specimen: Nasal Mucosa; Nasal Swab  Result Value Ref Range Status   MRSA by PCR Next Gen NOT DETECTED NOT DETECTED  Final    Comment: (NOTE) The GeneXpert MRSA Assay (FDA approved for NASAL specimens only), is one component of a comprehensive MRSA colonization surveillance program. It is not intended to diagnose MRSA infection nor to guide or monitor treatment for MRSA infections. Test performance is not FDA approved in patients less than 18 years old. Performed at [facility], [place] [address] [place], [place] [address]     Anti-infectives:  Anti-infectives (From admission, onward)    Start     Dose/Rate Route Frequency Ordered Stop   03/09/22 1400  ceFAZolin (ANCEF) IVPB 2g/100 mL premix        2 g 200 mL/hr over 30 Minutes Intravenous  Once 03/09/22 1346 03/09/22 1600     Consults: Treatment Team:  Md, Trauma, MD Kristeen Miss, MD    Studies:    Events:  Subjective:    Overnight Issues:   Objective:  Vital signs for last 24 hours: Temp:  [96.8 F (36 C)-98.2 F (36.8 C)] 98.1 F (36.7 C) (03/02 0900) Pulse Rate:  [54-64] 61 (03/02 0900) Resp:  [14-22] 15 (03/02 0900) BP: (105-138)/(47-61) 136/54 (03/02 0900) SpO2:  [99 %-100 %] 100 % (03/02 0900) FiO2 (%):  [40 %] 40 % (03/02 0800) Weight:  [93.1 kg] 93.1 kg (03/02 0227)  Hemodynamic parameters for last 24 hours:    Intake/Output from previous day: 03/01 0701 - 03/02 0700 In: 2567.3 [I.V.:1323.8; NG/GT:943.5; IV Piggyback:300] Out: 315 [Urine:315]  Intake/Output this shift: Total I/O In: 254.6 [I.V.:114.6; NG/GT:140] Out: 27 [Urine:27]  Vent settings for last 24 hours: Vent Mode:  PRVC FiO2 (%):  [40 %] 40 % Set Rate:  [16 bmp] 16 bmp Vt Set:  [600 mL] 600 mL PEEP:  [5 cmH20] 5 cmH20 Plateau Pressure:  [15 cmH20-23 cmH20] 15 cmH20  Physical Exam:  General: on vent Neuro: pupils 51m, spont moves, not F/C HEENT/Neck: ETT Resp: clear to auscultation bilaterally CVS: RRR GI: soft, ND Extremities: chronic edema BLE, wound L calf  Results for orders placed or performed during the hospital encounter  of 03/09/22 (from the past 24 hour(s))  Glucose, capillary     Status: Abnormal   Collection Time: 03/10/22 11:26 AM  Result Value Ref Range   Glucose-Capillary 111 (H) 70 - 99 mg/dL  Glucose, capillary     Status: Abnormal   Collection Time: 03/10/22  3:43 PM  Result Value Ref Range   Glucose-Capillary 171 (H) 70 - 99 mg/dL  Magnesium     Status: None   Collection Time: 03/10/22  4:52 PM  Result Value Ref Range   Magnesium 1.7 1.7 - 2.4 mg/dL  Phosphorus     Status: None   Collection Time: 03/10/22  4:52 PM  Result Value Ref Range   Phosphorus 2.8 2.5 - 4.6 mg/dL  Glucose, capillary     Status: Abnormal   Collection Time: 03/10/22  7:16 PM  Result Value Ref Range   Glucose-Capillary 122 (H) 70 - 99 mg/dL  Glucose, capillary     Status: Abnormal   Collection Time: 03/10/22 11:07 PM  Result Value Ref Range   Glucose-Capillary 147 (H) 70 - 99 mg/dL  Glucose, capillary     Status: Abnormal   Collection Time: 03/11/22  3:04 AM  Result Value Ref Range   Glucose-Capillary 176 (H) 70 - 99 mg/dL  Magnesium     Status: None   Collection Time: 03/11/22  5:51 AM  Result Value Ref Range   Magnesium 1.8 1.7 - 2.4 mg/dL  Phosphorus     Status: None   Collection Time: 03/11/22  5:51 AM  Result Value Ref Range   Phosphorus 2.9 2.5 - 4.6 mg/dL  CBC     Status: Abnormal   Collection Time: 03/11/22  5:51 AM  Result Value Ref Range   WBC 6.1 4.0 - 10.5 K/uL   RBC 2.43 (L) 4.22 - 5.81 MIL/uL   Hemoglobin 7.4 (L) 13.0 - 17.0 g/dL   HCT 23.4 (L) 39.0 - 52.0 %   MCV 96.3 80.0 - 100.0 fL   MCH 30.5 26.0 - 34.0 pg   MCHC 31.6 30.0 - 36.0 g/dL   RDW 14.7 11.5 - 15.5 %   Platelets 183 150 - 400 K/uL   nRBC 0.0 0.0 - 0.2 %  Vitamin B12     Status: Abnormal   Collection Time: 03/11/22  5:51 AM  Result Value Ref Range   Vitamin B-12 1,016 (H) 180 - 914 pg/mL  Ammonia     Status: None   Collection Time: 03/11/22  5:51 AM  Result Value Ref Range   Ammonia 28 9 - 35 umol/L  TSH     Status:  None   Collection Time: 03/11/22  5:51 AM  Result Value Ref Range   TSH 1.370 0.350 - 4.500 uIU/mL  Folate     Status: Abnormal   Collection Time: 03/11/22  5:51 AM  Result Value Ref Range   Folate 5.0 (L) >5.9 ng/mL  Glucose, capillary     Status: Abnormal   Collection Time: 03/11/22  8:23 AM  Result Value  Ref Range   Glucose-Capillary 178 (H) 70 - 99 mg/dL    Assessment & Plan: Present on Admission:  TBI (traumatic brain injury) ([place])    LOS: 2 days   Additional comments:I reviewed the patient's new clinical lab test results. And CT H Fall Bilateral SDH, falcine hematoma - NSGY Dr. Ellene Route consulted. Suspects mostly chronic small acute component with h/o craniotomy 2016 for b/l SDH. Keppra for seizure prophylaxis. Repeat CT head 3/1 stable Scalp laceration with hematoma - stapled by EDP. Removal in 10-14 days Acute hypoxic ventilator dependent respiratory failure - begin weaning trials this weekend if able Pancreatic duct stricture - incidental finding. Will need outpatient follow up HTN H/o multiple falls - not on anticoagulation currently per med list review CT c spine negative - D/C collar  FEN: hypoglycemia and hypokalemia - now IVF to D50.9NS with 20K; on TF ID: [email or web address] in ED VTE: SCDs. Plan start LMWH 3/3 (48H after stable F/U CT H) Foley - Placed for I/O Dispo: ICU, having EEG today; weaning when able Critical Care Total Time*: 40 Minutes  Check [email or web address] for General Surgery coverage night/weekend/holidays  Page if acute issues. No secure chat available for me given surgeries/clinic/off post call which would lead to a delay in care.  Nadeen Landau, MD [facility]  03/11/2022  *Care during the described time interval was provided by me. I have reviewed this patient's available data, including medical history, events of note, physical examination and test results as part of my evaluation.

## 2022-03-12 DIAGNOSIS — R4182 Altered mental status, unspecified: Secondary | ICD-10-CM | POA: Diagnosis not present

## 2022-03-12 DIAGNOSIS — S065XAA Traumatic subdural hemorrhage with loss of consciousness status unknown, initial encounter: Secondary | ICD-10-CM | POA: Diagnosis not present

## 2022-03-12 LAB — GLUCOSE, CAPILLARY
Glucose-Capillary: 72 mg/dL (ref 70–99)
Glucose-Capillary: 103 mg/dL — ABNORMAL HIGH (ref 70–99)
Glucose-Capillary: 176 mg/dL — ABNORMAL HIGH (ref 70–99)
Glucose-Capillary: 219 mg/dL — ABNORMAL HIGH (ref 70–99)
Glucose-Capillary: 179 mg/dL — ABNORMAL HIGH (ref 70–99)
Glucose-Capillary: 176 mg/dL — ABNORMAL HIGH (ref 70–99)

## 2022-03-12 LAB — CBC
HCT: 21.5 % — ABNORMAL LOW (ref 39.0–52.0)
Hemoglobin: 7.1 g/dL — ABNORMAL LOW (ref 13.0–17.0)
MCH: 31.8 pg (ref 26.0–34.0)
MCHC: 33 g/dL (ref 30.0–36.0)
MCV: 96.4 fL (ref 80.0–100.0)
Platelets: 171 10*3/uL (ref 150–400)
RBC: 2.23 MIL/uL — ABNORMAL LOW (ref 4.22–5.81)
RDW: 15 % (ref 11.5–15.5)
WBC: 6.3 10*3/uL (ref 4.0–10.5)
nRBC: 0 % (ref 0.0–0.2)

## 2022-03-12 LAB — BASIC METABOLIC PANEL
Anion gap: 5 (ref 5–15)
BUN: 39 mg/dL — ABNORMAL HIGH (ref 8–23)
CO2: 21 mmol/L — ABNORMAL LOW (ref 22–32)
Calcium: 7 mg/dL — ABNORMAL LOW (ref 8.9–10.3)
Chloride: 114 mmol/L — ABNORMAL HIGH (ref 98–111)
Creatinine, Ser: 2.71 mg/dL — ABNORMAL HIGH (ref 0.61–1.24)
GFR, Estimated: 23 mL/min — ABNORMAL LOW (ref >60)
Glucose, Bld: 100 mg/dL — ABNORMAL HIGH (ref 70–99)
Potassium: 4.9 mmol/L (ref 3.5–5.1)
Sodium: 140 mmol/L (ref 135–145)

## 2022-03-12 LAB — PHOSPHORUS: Phosphorus: 3.2 mg/dL (ref 2.5–4.6)

## 2022-03-12 LAB — MAGNESIUM: Magnesium: 1.9 mg/dL (ref 1.7–2.4)

## 2022-03-12 MED ORDER — ENOXAPARIN SODIUM 30 MG/0.3ML IJ SOSY
30.0000 mg | PREFILLED_SYRINGE | INTRAMUSCULAR | Status: DC
  Administered 2022-03-12 – 2022-03-19 (×8): 30 mg via SUBCUTANEOUS
  Filled 2022-03-12 (×8): qty 0.3

## 2022-03-12 MED ORDER — LORAZEPAM 2 MG/ML IJ SOLN
2.0000 mg | Freq: Once | INTRAMUSCULAR | Status: AC
  Administered 2022-03-12: 2 mg via INTRAVENOUS
  Filled 2022-03-12: qty 1

## 2022-03-12 MED ORDER — FUROSEMIDE 10 MG/ML IJ SOLN
40.0000 mg | Freq: Once | INTRAMUSCULAR | Status: AC
  Administered 2022-03-12: 40 mg via INTRAVENOUS
  Filled 2022-03-12: qty 4

## 2022-03-12 NOTE — Procedures (Signed)
Patient Name: David Mendoza  MRN: [id number]  Epilepsy Attending: Lora Havens  Referring Physician/Provider: Donnetta Simpers, MD  Duration: 03/12/2022 [id number] 03/13/2022 0312   Patient history:  82 y.o. 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 CT revealed chronic 56m SDH overlying L cerebral hemisphre along with small volume acute SDH along left tentorium. Repeat CT stable. EEG to evaluate for seizure   Level of alertness: lethargic    AEDs during EEG study: LEV   Technical aspects: This EEG study was done with scalp electrodes positioned according to the 10-20 International system of electrode placement. Electrical activity was reviewed with band pass filter of 1-'70Hz'$ , sensitivity of 7 uV/mm, display speed of 365msec with a '60Hz'$  notched filter applied as appropriate. EEG data were recorded continuously and digitally stored.  Video monitoring was available and reviewed as appropriate.   Description: EEG showed continuous generalized low amplitude 5-'8hz'$  theta-alpha activity admixed with intermittent generalized 2-'3hz'$  delta slowing, at times with triphasic morphology. Hyperventilation and photic stimulation were not performed.      ABNORMALITY - Continuous slow, generalized   IMPRESSION: This study was suggestive of moderate to severe diffuse encephalopathy. No seizures or epileptiform discharges were seen throughout the recording.   Steve Youngberg O [name]

## 2022-03-12 NOTE — Progress Notes (Signed)
Patient ID: David Mendoza, male   DOB: [birth year], 82 y.o.   MRN: [id number] Follow up - Trauma Critical Care   Patient Details:    David Mendoza is an 82 y.o. male.  Lines/tubes : Airway 7.5 mm (Active)     Airway 8 mm (Active)  Secured at (cm) 25 cm 03/10/22 0343  Measured From Lips 03/10/22 [place] 03/10/22 0343  Secured By [employer] 03/10/22 0343  Tube Holder Repositioned Yes 03/10/22 0343  Prone position No 03/10/22 0343  Cuff Pressure (cm H2O) Clear OR 27-39 CmH2O 03/09/22 2043  Site Condition Dry 03/09/22 2043     NG/OG Vented/Dual Lumen Oral External length of tube (Active)  Tube Position (Required) External length of tube 03/09/22 2000  Ongoing Placement Verification (Required) (See row information) Yes 03/09/22 2000  Site Assessment Clean, Dry, Intact 03/09/22 2000  Status Low intermittent suction 03/09/22 2000  Drainage Appearance Bile 03/09/22 2000  Intake (mL) 100 mL 03/09/22 2200     Urethral Catheter Amy Stanley Straight-tip 16 Fr. (Active)  Indication for Insertion or Continuance of Catheter Unstable critically ill patients first 24-48 hours (See Criteria) 03/09/22 2000  Site Assessment Clean, Dry, Intact 03/09/22 2000  Catheter Maintenance Bag below level of bladder;Catheter secured;Drainage bag/tubing not touching floor;Insertion date on drainage bag;No dependent loops;Seal intact 03/09/22 2000  Collection Container Standard drainage bag 03/09/22 2000  Securement Method Securing device (Describe) 03/09/22 2000  Urinary Catheter Interventions (if applicable) Unclamped [id number] 1700  Output (mL) 20 mL 03/10/22 0600    Microbiology/Sepsis markers: Results for orders placed or performed during the hospital encounter of 03/09/22  MRSA Next Gen by PCR, Nasal     Status: None   Collection Time: 03/09/22  5:00 PM   Specimen: Nasal Mucosa; Nasal Swab  Result Value Ref Range Status   MRSA by PCR Next Gen NOT DETECTED NOT DETECTED  Final    Comment: (NOTE) The GeneXpert MRSA Assay (FDA approved for NASAL specimens only), is one component of a comprehensive MRSA colonization surveillance program. It is not intended to diagnose MRSA infection nor to guide or monitor treatment for MRSA infections. Test performance is not FDA approved in patients less than 59 years old. Performed at [facility], [place] [address], [place], [place] [address]     Anti-infectives:  Anti-infectives (From admission, onward)    Start     Dose/Rate Route Frequency Ordered Stop   03/09/22 1400  ceFAZolin (ANCEF) IVPB 2g/100 mL premix        2 g 200 mL/hr over 30 Minutes Intravenous  Once 03/09/22 1346 03/09/22 1600     Consults: Treatment Team:  Md, Trauma, MD Kristeen Miss, MD    Studies:    Events:  Subjective:    Overnight Issues:   Objective:  Vital signs for last 24 hours: Temp:  [96.8 F (36 C)-99.3 F (37.4 C)] 98.1 F (36.7 C) (03/03 0900) Pulse Rate:  [61-71] 67 (03/03 0900) Resp:  [11-27] 15 (03/03 0900) BP: (104-146)/(43-62) 131/52 (03/03 0900) SpO2:  [97 %-100 %] 99 % (03/03 0900) FiO2 (%):  [40 %] 40 % (03/03 1025) Weight:  [92.8 kg] 92.8 kg (03/03 0812)  Hemodynamic parameters for last 24 hours:    Intake/Output from previous day: 03/02 0701 - 03/03 0700 In: 3985.7 [I.V.:1313.4; NG/GT:1415; IV Piggyback:1257.3] Out: 430 [Urine:430]  Intake/Output this shift: Total I/O In: 106.4 [I.V.:51.4; NG/GT:55] Out: 95 [Urine:95]  Vent settings for last 24 hours: Vent Mode:  PSV;CPAP FiO2 (%):  [40 %] 40 % Set Rate:  [16 bmp] 16 bmp Vt Set:  [600 mL] 600 mL PEEP:  [5 cmH20] 5 cmH20 Pressure Support:  [10 cmH20] 10 cmH20 Plateau Pressure:  [17 cmH20] 17 cmH20  Physical Exam:  General: on vent Neuro: pupils 35m, spont moves, not F/C HEENT/Neck: ETT Resp: clear to auscultation bilaterally CVS: RRR GI: soft, ND Extremities: chronic edema BLE, wound L calf  Results for orders placed or  performed during the hospital encounter of 03/09/22 (from the past 24 hour(s))  Glucose, capillary     Status: Abnormal   Collection Time: 03/11/22 12:29 PM  Result Value Ref Range   Glucose-Capillary 102 (H) 70 - 99 mg/dL  Glucose, capillary     Status: Abnormal   Collection Time: 03/11/22  3:25 PM  Result Value Ref Range   Glucose-Capillary 126 (H) 70 - 99 mg/dL  Magnesium     Status: None   Collection Time: 03/11/22  6:52 PM  Result Value Ref Range   Magnesium 1.8 1.7 - 2.4 mg/dL  Phosphorus     Status: None   Collection Time: 03/11/22  6:52 PM  Result Value Ref Range   Phosphorus 3.0 2.5 - 4.6 mg/dL  Glucose, capillary     Status: Abnormal   Collection Time: 03/11/22  7:19 PM  Result Value Ref Range   Glucose-Capillary 162 (H) 70 - 99 mg/dL  Glucose, capillary     Status: Abnormal   Collection Time: 03/11/22  8:45 PM  Result Value Ref Range   Glucose-Capillary 177 (H) 70 - 99 mg/dL  Glucose, capillary     Status: Abnormal   Collection Time: 03/11/22 11:13 PM  Result Value Ref Range   Glucose-Capillary 135 (H) 70 - 99 mg/dL  Glucose, capillary     Status: None   Collection Time: 03/12/22  4:04 AM  Result Value Ref Range   Glucose-Capillary 72 70 - 99 mg/dL  Magnesium     Status: None   Collection Time: 03/12/22  4:43 AM  Result Value Ref Range   Magnesium 1.9 1.7 - 2.4 mg/dL  Phosphorus     Status: None   Collection Time: 03/12/22  4:43 AM  Result Value Ref Range   Phosphorus 3.2 2.5 - 4.6 mg/dL  CBC     Status: Abnormal   Collection Time: 03/12/22  4:43 AM  Result Value Ref Range   WBC 6.3 4.0 - 10.5 K/uL   RBC 2.23 (L) 4.22 - 5.81 MIL/uL   Hemoglobin 7.1 (L) 13.0 - 17.0 g/dL   HCT 21.5 (L) 39.0 - 52.0 %   MCV 96.4 80.0 - 100.0 fL   MCH 31.8 26.0 - 34.0 pg   MCHC 33.0 30.0 - 36.0 g/dL   RDW 15.0 11.5 - 15.5 %   Platelets 171 150 - 400 K/uL   nRBC 0.0 0.0 - 0.2 %  Basic metabolic panel     Status: Abnormal   Collection Time: 03/12/22  4:43 AM  Result Value  Ref Range   Sodium 140 135 - 145 mmol/L   Potassium 4.9 3.5 - 5.1 mmol/L   Chloride 114 (H) 98 - 111 mmol/L   CO2 21 (L) 22 - 32 mmol/L   Glucose, Bld 100 (H) 70 - 99 mg/dL   BUN 39 (H) 8 - 23 mg/dL   Creatinine, Ser 2.71 (H) 0.61 - 1.24 mg/dL   Calcium 7.0 (L) 8.9 - 10.3 mg/dL   GFR, Estimated 23 (L) >60  mL/min   Anion gap 5 5 - 15  Glucose, capillary     Status: Abnormal   Collection Time: 03/12/22  7:14 AM  Result Value Ref Range   Glucose-Capillary 103 (H) 70 - 99 mg/dL    Assessment & Plan: Present on Admission:  TBI (traumatic brain injury) ([place])    LOS: 3 days   Additional comments:I reviewed the patient's new clinical lab test results. And CT H Fall Bilateral SDH, falcine hematoma - NSGY Dr. Ellene Route consulted. Suspects mostly chronic small acute component with h/o craniotomy 2016 for b/l SDH. Keppra for seizure prophylaxis. Repeat CT head 3/1 stable Scalp laceration with hematoma - stapled by EDP. Removal in 10-14 days Acute hypoxic ventilator dependent respiratory failure - begin weaning trials this weekend if able Pancreatic duct stricture - incidental finding. Will need outpatient follow up HTN H/o multiple falls - not on anticoagulation currently per med list review CT c spine negative - D/C collar  FEN: hypoglycemia and hypokalemia resolved - now IVF to D50.9NS with 20K; on TF. Will give lasix today; suspect despite fluid support UOP unchanged he may be volume overloaded as he has diffuse anasarca and effusion on CXR 3/2 ID: ancef in ED VTE: SCDs. Plan start LMWH 3/3 (48H after stable F/U CT H) Foley - Placed for I/O Dispo: ICU, EEG continues today; weaning when able Critical Care Total Time*: 40 Minutes  Check [email or web address] for General Surgery coverage night/weekend/holidays  Page if acute issues. No secure chat available for me given surgeries/clinic/off post call which would lead to a delay in care.  Nadeen Landau, MD [facility]  [facility]  03/12/2022  *Care during the described time interval was provided by me. I have reviewed this patient's available data, including medical history, events of note, physical examination and test results as part of my evaluation.

## 2022-03-12 NOTE — Progress Notes (Signed)
Performed maintenance on EKG, P7 Pz and 02.  All under 10.  No visible skin breakdown.

## 2022-03-12 NOTE — Progress Notes (Addendum)
Neurology Progress Note  Brief HPI: 82 year old patient with history of traumatic subdural hematoma with craniotomy in 2016, DVT, hypertension, hyperlipidemia and diabetes with retinopathy and neuropathy presented after a fall from standing height in which he struck the back of his head.  After his fall, he was initially alert and oriented x 3 then became poorly responsive with posturing upon arrival to the ED.  CT head demonstrated chronic 13 mm subdural hematoma over lying left cerebral hemisphere with small volume acute subdural hematoma along left tentorium.  Patient was intubated for airway protection.  Subjective: Patient is intubated, sedation has been off since 0730. RN at bedside. LTM connected-  with continuous slowing. Event button was pressed for 2 events with noted seizure like movements without concomitant EEG change- events were most likely not epileptic.  Eyes are closed, does not open eyes to deep noxious stimuli, does not follow commands, no purposeful movement, withdraws left leg to noxious stimuli. Cr 2.71, Hgb 7.1  Exam: Vitals:   03/12/22 0800 03/12/22 0830  BP: (!) 135/55 (!) 146/59  Pulse: 68 70  Resp: 17 19  Temp: 98.2 F (36.8 C) 98.2 F (36.8 C)  SpO2: 97% 98%   Gen: In bed, NAD Resp: non-labored breathing, no acute distress Abd: soft, nt Skin: edema in all 4 extremities 3+  Neuro  Patient is unable to respond to name or follow commands. Eyes are closed, does not open to voice or painful stimuli.  Pupils equal round and reactive, oculocephalic reflex intact, corneal reflex intact, cough and gag intact.  Withdraws in left lower leg, flickers right lower to noxious stimuli, will not respond to noxious stimuli in bilateral upper extremities  Pertinent Labs:    Latest Ref Rng & Units 03/12/2022    4:43 AM 03/11/2022    5:51 AM 03/10/2022    6:46 AM  CBC  WBC 4.0 - 10.5 K/uL 6.3  6.1  6.2   Hemoglobin 13.0 - 17.0 g/dL 7.1  7.4  7.9   Hematocrit 39.0 - 52.0 % 21.5   23.4  23.3   Platelets 150 - 400 K/uL 171  183  201        Latest Ref Rng & Units 03/12/2022    4:43 AM 03/11/2022    9:30 AM 03/10/2022    6:46 AM  BMP  Glucose 70 - 99 mg/dL 100  186  99   BUN 8 - 23 mg/dL 39  31  24   Creatinine 0.61 - 1.24 mg/dL 2.71  2.74  2.32   Sodium 135 - 145 mmol/L 140  140  139   Potassium 3.5 - 5.1 mmol/L 4.9  4.5  3.7   Chloride 98 - 111 mmol/L 114  112  111   CO2 22 - 32 mmol/L '21  20  22   '$ Calcium 8.9 - 10.3 mg/dL 7.0  7.5  7.3   Folate 5.0 Thiamine in process TSH 1.370 Ammonia 28 B12 1016  Imaging Reviewed:  CT head 2/29: Subdural hematoma overlying left cerebral hemisphere measuring up to 13 mm in thickness, likely chronic with small volume acute hemorrhage in this collection, chronic subdural hematomas overlying right cerebral hemisphere, mild cerebral atrophy and left parietal scalp hematoma and laceration  CT cervical spine 2/29: No evidence of acute fracture  CT head 3/1: Similar size of bilateral subdural collections, no acute intracranial abnormalities  EEG 3/2 continuous slow, generalized and background suppression, generalized, suggestive of severe to profound diffuse encephalopathy  LTM EEG 3/2-3/3: Continuous  slow, generalized; Background suppression, generalized This study was initially suggestive of severe to profound diffuse encephalopathy. As sedation was weaned, EEG was suggestive of severe diffuse encephalopathy. No seizures or epileptiform discharges were seen throughout the recording.   Assessment: 81 year old patient with history of traumatic subdural hematoma with craniotomy in 2016, DVT, hypertension, hyperlipidemia and diabetes with retinopathy and neuropathy scented after a fall from standing height in which she struck his head.  He was initially alert and oriented after the fall but then became poorly responsive with posturing upon arrival to the ED.  He was then intubated for airway protection.  CT head demonstrates acute on  chronic subdural hematoma.  EEG reveals no seizure activity.  Sedation was turned off this AM, will re-assess mental status in AM.  May also be a component of delirium and toxic metabolic encephalopathy given increased BUN and creatinine.  Will supplement folate and thiamine given low folate level and pending thiamine level  Impression: Acute on chronic traumatic subdural hematoma, as well as likely toxic metabolic encephalopathy  Recommendations: 1) continue Keppra 500 mg every 12 hours given subdural hematomas 2) continue long-term EEG for now given sedation turned off this am. D/c tomorrow if no seizure activity overnight 3) Continue folate 1 mg daily and thiamine 100 mg daily 4) MRI tomorrow after EEG discontinued 5) Neurology will continue to follow   Beulah Gandy DNP, [place]  [facility]   Attending Neurohospitalist Addendum Patient seen and examined with APP/Resident. Agree with the history and physical as documented above. Agree with the plan as documented, which I helped formulate. I have edited the note above to reflect my full findings and recommendations. I have independently reviewed the chart, obtained history, review of systems and examined the patient.I have personally reviewed pertinent head/neck/spine imaging (CT/MRI). Please feel free to call with any questions.  This patient is critically ill and at significant risk of neurological worsening, death and care requires constant monitoring of vital signs, hemodynamics,respiratory and cardiac monitoring, neurological assessment, discussion with family, other specialists and medical decision making of high complexity. I spent 45 minutes of neurocritical care time  in the care of  this patient. This was time spent independent of any time provided by nurse practitioner or PA.  Su Monks, MD [facility] [phone]  If 7pm- 7am, please page neurology on call as listed in [facility].

## 2022-03-12 NOTE — Progress Notes (Signed)
  NEUROSURGERY PROGRESS NOTE   No issues overnight.   EXAM:  BP (!) 137/54   Pulse 67   Temp (!) 97.5 F (36.4 C)   Resp 15   Ht 5' 10.98" (1.803 m)   Wt 92.8 kg   SpO2 99%   BMI 28.55 kg/m   Off propofol: No eye opening to pain Pupils reactive Breathing spontaneously ?w/d to central pain  IMPRESSION:  82 y.o. male s/p fall with severe encephalopathy of unclear etiology. CTH with bilateral chronic SDH with mild associated mass effect but would not expect that to be there cause of his condition. EEG without SZ.  PLAN: - Cont supportive care per trauma - Appreciate neurology input - Can consider MRI brain w/o gad once EEG comes off   Consuella Lose, MD [facility]

## 2022-03-12 NOTE — Progress Notes (Signed)
Dr. Barry Dienes paged. MD made aware that patient had a 22 beat run of vtach. New orders for a 12 lead ECG. Will make lab aware that they need to draw labs early.

## 2022-03-13 DIAGNOSIS — R4182 Altered mental status, unspecified: Secondary | ICD-10-CM | POA: Diagnosis not present

## 2022-03-13 DIAGNOSIS — S065XAA Traumatic subdural hemorrhage with loss of consciousness status unknown, initial encounter: Secondary | ICD-10-CM | POA: Diagnosis not present

## 2022-03-13 LAB — BASIC METABOLIC PANEL
Anion gap: 3 — ABNORMAL LOW (ref 5–15)
BUN: 46 mg/dL — ABNORMAL HIGH (ref 8–23)
CO2: 23 mmol/L (ref 22–32)
Calcium: 7.5 mg/dL — ABNORMAL LOW (ref 8.9–10.3)
Chloride: 112 mmol/L — ABNORMAL HIGH (ref 98–111)
Creatinine, Ser: 2.78 mg/dL — ABNORMAL HIGH (ref 0.61–1.24)
GFR, Estimated: 22 mL/min — ABNORMAL LOW (ref >60)
Glucose, Bld: 220 mg/dL — ABNORMAL HIGH (ref 70–99)
Potassium: 5.3 mmol/L — ABNORMAL HIGH (ref 3.5–5.1)
Sodium: 138 mmol/L (ref 135–145)

## 2022-03-13 LAB — GLUCOSE, CAPILLARY
Glucose-Capillary: 180 mg/dL — ABNORMAL HIGH (ref 70–99)
Glucose-Capillary: 228 mg/dL — ABNORMAL HIGH (ref 70–99)
Glucose-Capillary: 222 mg/dL — ABNORMAL HIGH (ref 70–99)
Glucose-Capillary: 177 mg/dL — ABNORMAL HIGH (ref 70–99)
Glucose-Capillary: 203 mg/dL — ABNORMAL HIGH (ref 70–99)
Glucose-Capillary: 199 mg/dL — ABNORMAL HIGH (ref 70–99)

## 2022-03-13 LAB — POCT I-STAT 7, (LYTES, BLD GAS, ICA,H+H)
Acid-base deficit: 9 mmol/L — ABNORMAL HIGH (ref 0.0–2.0)
Bicarbonate: 18.4 mmol/L — ABNORMAL LOW (ref 20.0–28.0)
Calcium, Ion: 1.05 mmol/L — ABNORMAL LOW (ref 1.15–1.40)
HCT: 23 % — ABNORMAL LOW (ref 39.0–52.0)
Hemoglobin: 7.8 g/dL — ABNORMAL LOW (ref 13.0–17.0)
O2 Saturation: 99 %
Patient temperature: 97.8
Potassium: 4.7 mmol/L (ref 3.5–5.1)
Sodium: 135 mmol/L (ref 135–145)
TCO2: 20 mmol/L — ABNORMAL LOW (ref 22–32)
pCO2 arterial: 43.6 mmHg (ref 32–48)
pH, Arterial: 7.231 — ABNORMAL LOW (ref 7.35–7.45)
pO2, Arterial: 144 mmHg — ABNORMAL HIGH (ref 83–108)

## 2022-03-13 LAB — CBC
HCT: 22.6 % — ABNORMAL LOW (ref 39.0–52.0)
Hemoglobin: 7.1 g/dL — ABNORMAL LOW (ref 13.0–17.0)
MCH: 30.9 pg (ref 26.0–34.0)
MCHC: 31.4 g/dL (ref 30.0–36.0)
MCV: 98.3 fL (ref 80.0–100.0)
Platelets: 199 10*3/uL (ref 150–400)
RBC: 2.3 MIL/uL — ABNORMAL LOW (ref 4.22–5.81)
RDW: 15.1 % (ref 11.5–15.5)
WBC: 8 10*3/uL (ref 4.0–10.5)
nRBC: 0 % (ref 0.0–0.2)

## 2022-03-13 LAB — TRIGLYCERIDES: Triglycerides: 45 mg/dL (ref <150)

## 2022-03-13 MED ORDER — LEVETIRACETAM 500 MG PO TABS
500.0000 mg | ORAL_TABLET | Freq: Two times a day (BID) | ORAL | Status: AC
  Administered 2022-03-13 – 2022-03-15 (×6): 500 mg
  Filled 2022-03-13 (×6): qty 1

## 2022-03-13 MED ORDER — SODIUM ZIRCONIUM CYCLOSILICATE 10 G PO PACK
10.0000 g | PACK | Freq: Once | ORAL | Status: AC
  Administered 2022-03-13: 10 g
  Filled 2022-03-13: qty 1

## 2022-03-13 MED ORDER — FUROSEMIDE 10 MG/ML IJ SOLN
40.0000 mg | Freq: Once | INTRAMUSCULAR | Status: AC
  Administered 2022-03-13: 40 mg via INTRAVENOUS
  Filled 2022-03-13: qty 4

## 2022-03-13 MED ORDER — CARVEDILOL 3.125 MG PO TABS
3.1250 mg | ORAL_TABLET | Freq: Two times a day (BID) | ORAL | Status: DC
  Administered 2022-03-13 – 2022-03-15 (×6): 3.125 mg
  Filled 2022-03-13 (×6): qty 1

## 2022-03-13 MED ORDER — INSULIN ASPART 100 UNIT/ML IJ SOLN
0.0000 [IU] | INTRAMUSCULAR | Status: DC
  Administered 2022-03-13: 3 [IU] via SUBCUTANEOUS
  Administered 2022-03-13: 5 [IU] via SUBCUTANEOUS
  Administered 2022-03-13: 6 [IU] via SUBCUTANEOUS
  Administered 2022-03-14 (×2): 3 [IU] via SUBCUTANEOUS
  Administered 2022-03-14: 2 [IU] via SUBCUTANEOUS
  Administered 2022-03-14 – 2022-03-15 (×5): 3 [IU] via SUBCUTANEOUS
  Administered 2022-03-15: 2 [IU] via SUBCUTANEOUS
  Administered 2022-03-15: 8 [IU] via SUBCUTANEOUS
  Administered 2022-03-16: 5 [IU] via SUBCUTANEOUS
  Administered 2022-03-16: 8 [IU] via SUBCUTANEOUS
  Administered 2022-03-16 (×2): 5 [IU] via SUBCUTANEOUS
  Administered 2022-03-16 – 2022-03-17 (×5): 3 [IU] via SUBCUTANEOUS
  Administered 2022-03-17: 5 [IU] via SUBCUTANEOUS
  Administered 2022-03-18: 3 [IU] via SUBCUTANEOUS
  Administered 2022-03-18: 5 [IU] via SUBCUTANEOUS
  Administered 2022-03-18 (×2): 3 [IU] via SUBCUTANEOUS
  Administered 2022-03-19 (×5): 2 [IU] via SUBCUTANEOUS
  Administered 2022-03-20: 3 [IU] via SUBCUTANEOUS
  Administered 2022-03-20: 5 [IU] via SUBCUTANEOUS

## 2022-03-13 NOTE — Progress Notes (Signed)
Pt not tolerating SBT at this time due to apnea and low minute ventilation. Pt placed back on full vent support. Pt tolerating well at this time,vitals stable, RT will monitor.

## 2022-03-13 NOTE — Progress Notes (Addendum)
Patient ID: David Mendoza, male   DOB: 1940-10-17, 82 y.o.   MRN: [id number] Patients neuro status with little change though nurse reports more spontaneous activity though not following commands.No sustained level of alertness to allow weaning and extubation. EEG results noted. Agree with MRI brain when eeg monitoring complete to check status of sdh's.

## 2022-03-13 NOTE — Progress Notes (Addendum)
Patient ID: David Mendoza, male   DOB: Feb 22, 1940, 82 y.o.   MRN: [id number] Follow up - Trauma Critical Care   Patient Details:    David Mendoza is an 82 y.o. male.  Lines/tubes : Airway 7.5 mm (Active)     Airway 8 mm (Active)  Secured at (cm) 26 cm 03/13/22 0730  Measured From Lips 03/13/22 0730  Secured Location Right 03/13/22 0730  Secured By [employer] 03/13/22 0730  Tube Holder Repositioned Yes 03/13/22 0730  Prone position No 03/13/22 0730  Cuff Pressure (cm H2O) Clear OR 27-39 CmH2O 03/13/22 0730  Site Condition Dry 03/13/22 0730     NG/OG Vented/Dual Lumen Oral External length of tube (Active)  Tube Position (Required) Marking at nare/corner of mouth 03/12/22 2000  Measurement (cm) (Required) 60 cm 03/12/22 2000  Ongoing Placement Verification (Required) (See row information) Yes 03/12/22 2000  Site Assessment Clean, Dry, Intact;Tape intact 03/12/22 2000  Interventions Repositioned bridle 03/12/22 0730  Status Feeding 03/12/22 2000  Drainage Appearance None 03/11/22 0800  Intake (mL) 40 mL 03/12/22 2109  Output (mL) 0 mL 03/11/22 1600     Urethral Catheter [employer] Straight-tip 16 Fr. (Active)  Indication for Insertion or Continuance of Catheter Therapy based on hourly urine output monitoring and documentation for critical condition (NOT STRICT I&O) 03/12/22 2000  Site Assessment Clean, Dry, Intact;Swelling 03/12/22 2000  Catheter Maintenance Bag below level of bladder;Catheter secured;Drainage bag/tubing not touching floor;Insertion date on drainage bag;No dependent loops;Seal intact 03/12/22 2000  Collection Container Standard drainage bag 03/12/22 2000  Securement Method Securing device (Describe) 03/12/22 2000  Urinary Catheter Interventions (if applicable) Unclamped [id number]  Input (mL) 0 mL 03/11/22 1600  Output (mL) 115 mL 03/13/22 0600    Microbiology/Sepsis markers: Results for orders placed or performed during the hospital encounter  of 03/09/22  MRSA Next Gen by PCR, Nasal     Status: None   Collection Time: 03/09/22  5:00 PM   Specimen: Nasal Mucosa; Nasal Swab  Result Value Ref Range Status   MRSA by PCR Next Gen NOT DETECTED NOT DETECTED Final    Comment: (NOTE) The GeneXpert MRSA Assay (FDA approved for NASAL specimens only), is one component of a comprehensive MRSA colonization surveillance program. It is not intended to diagnose MRSA infection nor to guide or monitor treatment for MRSA infections. Test performance is not FDA approved in patients less than 37 years old. Performed at [facility], [place] [address] [place], [place] [address]     Anti-infectives:  Anti-infectives (From admission, onward)    Start     Dose/Rate Route Frequency Ordered Stop   03/09/22 1400  ceFAZolin (ANCEF) IVPB 2g/100 mL premix        2 g 200 mL/hr over 30 Minutes Intravenous  Once 03/09/22 1346 03/09/22 1600      Consults: Treatment Team:  Md, Trauma, MD Kristeen Miss, MD    Studies:    Events:  Subjective:    Overnight Issues: on EEG, sedation held  Objective:  Vital signs for last 24 hours: Temp:  [97.3 F (36.3 C)-99.1 F (37.3 C)] 97.3 F (36.3 C) (03/04 0700) Pulse Rate:  [65-77] 65 (03/04 0700) Resp:  [14-27] 16 (03/04 0700) BP: (124-168)/(52-78) 138/56 (03/04 0700) SpO2:  [96 %-100 %] 99 % (03/04 0700) FiO2 (%):  [40 %] 40 % (03/04 0730) Weight:  [94.8 kg] 94.8 kg (03/04 0500)  Hemodynamic parameters for last 24 hours:    Intake/Output from  previous day: 03/03 0701 - 03/04 0700 In: 2006.4 [I.V.:494; NG/GT:1312.3; IV Piggyback:200] Out: 2350 [Urine:2350]  Intake/Output this shift: No intake/output data recorded.  Vent settings for last 24 hours: Vent Mode: PRVC FiO2 (%):  [40 %] 40 % Set Rate:  [16 bmp] 16 bmp Vt Set:  [600 mL] 600 mL PEEP:  [5 cmH20] 5 cmH20 Pressure Support:  [8 cmH20-10 cmH20] 8 cmH20 Plateau Pressure:  [16 cmH20-18 cmH20] 18 cmH20  Physical Exam:   General: on vent. EEG Neuro: opens eyes to voice, WD to pain, not F/C HEENT/Neck: ETT Resp: CTA CVS: RRR GI: soft, ND, NT Extremities: mild edema  Results for orders placed or performed during the hospital encounter of 03/09/22 (from the past 24 hour(s))  Glucose, capillary     Status: Abnormal   Collection Time: 03/12/22 11:19 AM  Result Value Ref Range   Glucose-Capillary 176 (H) 70 - 99 mg/dL  Glucose, capillary     Status: Abnormal   Collection Time: 03/12/22  3:21 PM  Result Value Ref Range   Glucose-Capillary 219 (H) 70 - 99 mg/dL  Glucose, capillary     Status: Abnormal   Collection Time: 03/12/22  7:20 PM  Result Value Ref Range   Glucose-Capillary 179 (H) 70 - 99 mg/dL  Glucose, capillary     Status: Abnormal   Collection Time: 03/12/22 11:28 PM  Result Value Ref Range   Glucose-Capillary 176 (H) 70 - 99 mg/dL  Glucose, capillary     Status: Abnormal   Collection Time: 03/13/22  3:19 AM  Result Value Ref Range   Glucose-Capillary 180 (H) 70 - 99 mg/dL  Triglycerides     Status: None   Collection Time: 03/13/22  4:35 AM  Result Value Ref Range   Triglycerides 45 <150 mg/dL  CBC     Status: Abnormal   Collection Time: 03/13/22  4:35 AM  Result Value Ref Range   WBC 8.0 4.0 - 10.5 K/uL   RBC 2.30 (L) 4.22 - 5.81 MIL/uL   Hemoglobin 7.1 (L) 13.0 - 17.0 g/dL   HCT 22.6 (L) 39.0 - 52.0 %   MCV 98.3 80.0 - 100.0 fL   MCH 30.9 26.0 - 34.0 pg   MCHC 31.4 30.0 - 36.0 g/dL   RDW 15.1 11.5 - 15.5 %   Platelets 199 150 - 400 K/uL   nRBC 0.0 0.0 - 0.2 %  Basic metabolic panel     Status: Abnormal   Collection Time: 03/13/22  4:35 AM  Result Value Ref Range   Sodium 138 135 - 145 mmol/L   Potassium 5.3 (H) 3.5 - 5.1 mmol/L   Chloride 112 (H) 98 - 111 mmol/L   CO2 23 22 - 32 mmol/L   Glucose, Bld 220 (H) 70 - 99 mg/dL   BUN 46 (H) 8 - 23 mg/dL   Creatinine, Ser 2.78 (H) 0.61 - 1.24 mg/dL   Calcium 7.5 (L) 8.9 - 10.3 mg/dL   GFR, Estimated 22 (L) >60 mL/min   Anion  gap 3 (L) 5 - 15  Glucose, capillary     Status: Abnormal   Collection Time: 03/13/22  7:48 AM  Result Value Ref Range   Glucose-Capillary 228 (H) 70 - 99 mg/dL    Assessment & Plan: Present on Admission:  TBI (traumatic brain injury) ([place])    LOS: 4 days   Additional comments:I reviewed the patient's new clinical lab test results. EEG on Fall Bilateral SDH, falcine hematoma - NSGY Dr. Ellene Route consulted.  Suspects mostly chronic small acute component with h/o craniotomy 2016 for b/l SDH. Keppra for seizure prophylaxis. Repeat CT head 3/1 stable. Does not fully explain encephalopathy Scalp laceration with hematoma - stapled by EDP. Removal in 10-14 days Acute hypoxic ventilator dependent respiratory failure - weaning well, MS will need to improve for extubation Pancreatic duct stricture - incidental finding. Will need outpatient follow up HTN - start home Coreg CKD stage 4 H/o multiple falls - not on anticoagulation currently per med list review CT c spine negative - D/C collar  FEN: hyperkalemia - lokelma and lasix x 1 ID: ancef in ED VTE: SCDs. Lovenox Foley - Placed for I/O, diuresing today Dispo: ICU, EEG per Neurology may stop this AM; weaning  Critical Care Total Time*: 37 Minutes  Georganna Skeans, MD, MPH, FACS Trauma & General Surgery Use [email or web address] to contact on call provider  03/13/2022  *Care during the described time interval was provided by me. I have reviewed this patient's available data, including medical history, events of note, physical examination and test results as part of my evaluation.

## 2022-03-13 NOTE — Inpatient Diabetes Management (Signed)
Inpatient Diabetes Program Recommendations  AACE/ADA: New Consensus Statement on Inpatient Glycemic Control (2015)  Target Ranges:  Prepandial:   less than 140 mg/dL      Peak postprandial:   less than 180 mg/dL (1-2 hours)      Critically ill patients:  140 - 180 mg/dL    Latest Reference Range & Units 03/12/22 23:28 03/13/22 03:19 03/13/22 07:48 03/13/22 11:02  Glucose-Capillary 70 - 99 mg/dL 176 (H)  4 units Novolog  180 (H)  4 units Novolog  228 (H)  6 units Novolog  222 (H)  6 units Novolog   (H): Data is abnormally high     Home DM Meds: Glipizide 2.5 mg BID          Metformin 500 mg  BID   Current Orders: Novolog Moderate Correction Scale/ SSI (0-15 units) Q4 hours     MD- Note pt getting tube feeds at 55cc/hr  CBGs now >200 since 8am  If this trend continues, please consider adding Novolog tube feed coverage:  Novolog 3 units Q4 hours HOLD if tube feeds HELD for any reason     --Will follow patient during hospitalization--  Wyn Quaker RN, MSN, [place] Diabetes Coordinator Inpatient Glycemic Control Team Team Pager: [phone] (8a-5p)

## 2022-03-13 NOTE — Progress Notes (Addendum)
Subjective: Intubated. Not on any IV sedation.   Objective: Current vital signs: BP (!) 167/66   Pulse 74   Temp (!) 97.3 F (36.3 C)   Resp 16   Ht 5' 10.98" (1.803 m)   Wt 94.8 kg   SpO2 99%   BMI 29.17 kg/m  Vital signs in last 24 hours: Temp:  [97.3 F (36.3 C)-99.1 F (37.3 C)] 97.3 F (36.3 C) (03/04 0700) Pulse Rate:  [65-77] 74 (03/04 0954) Resp:  [14-27] 16 (03/04 0700) BP: (124-168)/(52-78) 167/66 (03/04 0954) SpO2:  [96 %-100 %] 99 % (03/04 0700) FiO2 (%):  [40 %] 40 % (03/04 0730) Weight:  [94.8 kg] 94.8 kg (03/04 0500)  Intake/Output from previous day: 03/03 0701 - 03/04 0700 In: 2006.4 [I.V.:494; NG/GT:1312.3; IV Piggyback:200] Out: 2350 [Urine:2350] Intake/Output this shift: Total I/O In: -  Out: 200 [Urine:200] Nutritional status:  Diet Order             Diet NPO time specified  Diet effective now                  HEENT: [place]/AT. No neck stiffness. Lungs: Intubated Ext: Warm and well perfused  Neurologic Exam: Ment: Eyes partially open spontaneously with occasional blinking. Eyes will open almost fully after application of noxious stimuli. Does not fixate gaze on any visual stimuli and does not follow any commands. No attempts to communicate. Withdraws to some stimuli, postures to others by adducting BLE at hips.  CN: Pupils equal. No blink to threat bilaterally. Weak corneals bilaterally. Eyes with conjugate roving EOM. No nystagmus. Face symmetric.  Motor: Decreased tone to BUE with no movement to multiple noxious stimuli.  BLE with weak symmetric withdrawal to plantar stimulation. Also postures to trapezius squeezing by adducting BLE at hips.  Sensory: As above Reflexes: Intact x 4 Cerebellar/Gait: Unable to assess.   Lab Results: Results for orders placed or performed during the hospital encounter of 03/09/22 (from the past 48 hour(s))  Glucose, capillary     Status: Abnormal   Collection Time: 03/11/22 12:29 PM  Result Value Ref  Range   Glucose-Capillary 102 (H) 70 - 99 mg/dL    Comment: Glucose reference range applies only to samples taken after fasting for at least 8 hours.  Glucose, capillary     Status: Abnormal   Collection Time: 03/11/22  3:25 PM  Result Value Ref Range   Glucose-Capillary 126 (H) 70 - 99 mg/dL    Comment: Glucose reference range applies only to samples taken after fasting for at least 8 hours.  Magnesium     Status: None   Collection Time: 03/11/22  6:52 PM  Result Value Ref Range   Magnesium 1.8 1.7 - 2.4 mg/dL    Comment: Performed at [facility], [place] [address] [place], [place] [address]  Phosphorus     Status: None   Collection Time: 03/11/22  6:52 PM  Result Value Ref Range   Phosphorus 3.0 2.5 - 4.6 mg/dL    Comment: Performed at [facility], [place] [address] [place], [place] [address]  Glucose, capillary     Status: Abnormal   Collection Time: 03/11/22  7:19 PM  Result Value Ref Range   Glucose-Capillary 162 (H) 70 - 99 mg/dL    Comment: Glucose reference range applies only to samples taken after fasting for at least 8 hours.  Glucose, capillary     Status: Abnormal   Collection Time: 03/11/22  8:45 PM  Result Value Ref Range   Glucose-Capillary 177 (H) 70 - 99 mg/dL    Comment: Glucose reference range applies only to samples taken after fasting for at least 8 hours.  Glucose, capillary     Status: Abnormal   Collection Time: 03/11/22 11:13 PM  Result Value Ref Range   Glucose-Capillary 135 (H) 70 - 99 mg/dL    Comment: Glucose reference range applies only to samples taken after fasting for at least 8 hours.  Glucose, capillary     Status: None   Collection Time: 03/12/22  4:04 AM  Result Value Ref Range   Glucose-Capillary 72 70 - 99 mg/dL    Comment: Glucose reference range applies only to samples taken after fasting for at least 8 hours.  Magnesium     Status: None   Collection Time: 03/12/22  4:43 AM  Result Value Ref Range   Magnesium 1.9 1.7 - 2.4  mg/dL    Comment: Performed at [facility] [address], [place], [place] [address]  Phosphorus     Status: None   Collection Time: 03/12/22  4:43 AM  Result Value Ref Range   Phosphorus 3.2 2.5 - 4.6 mg/dL    Comment: Performed at [facility] [address], [place], [place] [address]  CBC     Status: Abnormal   Collection Time: 03/12/22  4:43 AM  Result Value Ref Range   WBC 6.3 4.0 - 10.5 K/uL   RBC 2.23 (L) 4.22 - 5.81 MIL/uL   Hemoglobin 7.1 (L) 13.0 - 17.0 g/dL   HCT 21.5 (L) 39.0 - 52.0 %   MCV 96.4 80.0 - 100.0 fL   MCH 31.8 26.0 - 34.0 pg   MCHC 33.0 30.0 - 36.0 g/dL   RDW 15.0 11.5 - 15.5 %   Platelets 171 150 - 400 K/uL   nRBC 0.0 0.0 - 0.2 %    Comment: Performed at [facility], [place] [address], [place], [place] [id number]  Basic metabolic panel     Status: Abnormal   Collection Time: 03/12/22  4:43 AM  Result Value Ref Range   Sodium 140 135 - 145 mmol/L   Potassium 4.9 3.5 - 5.1 mmol/L   Chloride 114 (H) 98 - 111 mmol/L   CO2 21 (L) 22 - 32 mmol/L   Glucose, Bld 100 (H) 70 - 99 mg/dL    Comment: Glucose reference range applies only to samples taken after fasting for at least 8 hours.   BUN 39 (H) 8 - 23 mg/dL   Creatinine, Ser 2.71 (H) 0.61 - 1.24 mg/dL   Calcium 7.0 (L) 8.9 - 10.3 mg/dL   GFR, Estimated 23 (L) >60 mL/min    Comment: (NOTE) Calculated using the CKD-EPI Creatinine Equation (2021)    Anion gap 5 5 - 15    Comment: Performed at [facility] [address] [place], [place] [address]  Glucose, capillary     Status: Abnormal   Collection Time: 03/12/22  7:14 AM  Result Value Ref Range   Glucose-Capillary 103 (H) 70 - 99 mg/dL    Comment: Glucose reference range applies only to samples taken after fasting for at least 8 hours.  Glucose, capillary     Status: Abnormal   Collection Time: 03/12/22 11:19 AM  Result Value Ref Range   Glucose-Capillary 176 (H) 70 - 99 mg/dL    Comment: Glucose reference range applies  only to samples taken after fasting for  at least 8 hours.  Glucose, capillary     Status: Abnormal   Collection Time: 03/12/22  3:21 PM  Result Value Ref Range   Glucose-Capillary 219 (H) 70 - 99 mg/dL    Comment: Glucose reference range applies only to samples taken after fasting for at least 8 hours.  Glucose, capillary     Status: Abnormal   Collection Time: 03/12/22  7:20 PM  Result Value Ref Range   Glucose-Capillary 179 (H) 70 - 99 mg/dL    Comment: Glucose reference range applies only to samples taken after fasting for at least 8 hours.  Glucose, capillary     Status: Abnormal   Collection Time: 03/12/22 11:28 PM  Result Value Ref Range   Glucose-Capillary 176 (H) 70 - 99 mg/dL    Comment: Glucose reference range applies only to samples taken after fasting for at least 8 hours.  Glucose, capillary     Status: Abnormal   Collection Time: 03/13/22  3:19 AM  Result Value Ref Range   Glucose-Capillary 180 (H) 70 - 99 mg/dL    Comment: Glucose reference range applies only to samples taken after fasting for at least 8 hours.  Triglycerides     Status: None   Collection Time: 03/13/22  4:35 AM  Result Value Ref Range   Triglycerides 45 <150 mg/dL    Comment: Performed at [facility] [address] [place], [place] [address]  CBC     Status: Abnormal   Collection Time: 03/13/22  4:35 AM  Result Value Ref Range   WBC 8.0 4.0 - 10.5 K/uL   RBC 2.30 (L) 4.22 - 5.81 MIL/uL   Hemoglobin 7.1 (L) 13.0 - 17.0 g/dL   HCT 22.6 (L) 39.0 - 52.0 %   MCV 98.3 80.0 - 100.0 fL   MCH 30.9 26.0 - 34.0 pg   MCHC 31.4 30.0 - 36.0 g/dL   RDW 15.1 11.5 - 15.5 %   Platelets 199 150 - 400 K/uL   nRBC 0.0 0.0 - 0.2 %    Comment: Performed at [facility], [place] [address] [place], [place] [id number]  Basic metabolic panel     Status: Abnormal   Collection Time: 03/13/22  4:35 AM  Result Value Ref Range   Sodium 138 135 - 145 mmol/L   Potassium 5.3 (H) 3.5 - 5.1 mmol/L   Chloride 112  (H) 98 - 111 mmol/L   CO2 23 22 - 32 mmol/L   Glucose, Bld 220 (H) 70 - 99 mg/dL    Comment: Glucose reference range applies only to samples taken after fasting for at least 8 hours.   BUN 46 (H) 8 - 23 mg/dL   Creatinine, Ser 2.78 (H) 0.61 - 1.24 mg/dL   Calcium 7.5 (L) 8.9 - 10.3 mg/dL   GFR, Estimated 22 (L) >60 mL/min    Comment: (NOTE) Calculated using the CKD-EPI Creatinine Equation (2021)    Anion gap 3 (L) 5 - 15    Comment: Performed at [facility] [address], [place], [place] [address]  Glucose, capillary     Status: Abnormal   Collection Time: 03/13/22  7:48 AM  Result Value Ref Range   Glucose-Capillary 228 (H) 70 - 99 mg/dL    Comment: Glucose reference range applies only to samples taken after fasting for at least 8 hours.    Recent Results (from the past 240 hour(s))  MRSA Next Gen by PCR, Nasal  Status: None   Collection Time: 03/09/22  5:00 PM   Specimen: Nasal Mucosa; Nasal Swab  Result Value Ref Range Status   MRSA by PCR Next Gen NOT DETECTED NOT DETECTED Final    Comment: (NOTE) The GeneXpert MRSA Assay (FDA approved for NASAL specimens only), is one component of a comprehensive MRSA colonization surveillance program. It is not intended to diagnose MRSA infection nor to guide or monitor treatment for MRSA infections. Test performance is not FDA approved in patients less than 21 years old. Performed at [facility], [place] [address], [place], [place] [address]     Lipid Panel Recent Labs    03/13/22 0435  TRIG 45    Studies/Results: No results found.  Medications: Scheduled:  carvedilol  3.125 mg Per Tube BID WC   Chlorhexidine Gluconate Cloth  6 each Topical Q0600   docusate  100 mg Per Tube BID   enoxaparin (LOVENOX) injection  30 mg Subcutaneous Q24H   feeding supplement (PROSource TF20)  60 mL Per Tube Daily   folic acid  1 mg Per Tube Daily   insulin aspart  0-15 Units Subcutaneous Q4H   levETIRAcetam  500 mg Per Tube  BID   mouth rinse  15 mL Mouth Rinse Q2H   pantoprazole (PROTONIX) IV  40 mg Intravenous QHS   polyethylene glycol  17 g Per Tube Daily   thiamine  100 mg Per Tube Daily   Continuous:  feeding supplement (OSMOLITE 1.5 CAL) 55 mL/hr at 03/13/22 0700   fentaNYL infusion INTRAVENOUS Stopped (03/13/22 0643)   propofol (DIPRIVAN) infusion Stopped (03/11/22 1719)   Assessment: 82 year old patient with history of traumatic subdural hematoma s/p craniotomy in 2016, DVT, hypertension, hyperlipidemia and diabetes with retinopathy and neuropathy who presented after a fall from standing height in which he struck his head.  He was initially alert and oriented after the fall but then became poorly responsive with posturing upon arrival to the ED and was intubated for airway protection. CT head demonstrated acute on chronic subdural hematoma.  EEG revealed no seizure activity.   - Sedation was turned off this AM.  - His exam is most consistent with a vegetative versus obtunded or severely catatonic state.  - There may also be a component of delirium and toxic metabolic encephalopathy given increased BUN and creatinine.   - LTM EEG report for today AM: Continuous slow, generalized. This study is suggestive of moderate to severe diffuse encephalopathy. No seizures or epileptiform discharges were seen throughout the recording. - Overall impression: Acute on chronic traumatic subdural hematoma with severely depressed level of consciousness. Postconcussive syndrome in addition to mass effect from the subdurals as well as likely toxic metabolic encephalopathy are the most likely precipitants for his current clinical presentation. Possible cortical contusion versus stroke will need to be evaluated with MRI.    Recommendations: - Continue Keppra 500 mg every 12 hours given subdural hematomas - Discontinuing LTM EEG - Continue folate 1 mg daily and thiamine 100 mg daily - MRI brain - Neurology will continue to follow    35 minutes spent in the neurological evaluation and management of this critically ill patient   LOS: 4 days   '@Electronically'$  signed: Dr. Kerney Elbe 03/13/2022  10:56 AM

## 2022-03-13 NOTE — Procedures (Addendum)
Patient Name: David Mendoza  MRN: [id number]  Epilepsy Attending: Lora Havens  Referring Physician/Provider: Donnetta Simpers, MD  Duration: 03/13/2022 0312 03/13/2022 1344   Patient history:  82 y.o. 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 CT revealed chronic 23m SDH overlying L cerebral hemisphre along with small volume acute SDH along left tentorium. Repeat CT stable. EEG to evaluate for seizure   Level of alertness: lethargic    AEDs during EEG study: LEV   Technical aspects: This EEG study was done with scalp electrodes positioned according to the 10-20 International system of electrode placement. Electrical activity was reviewed with band pass filter of 1-'70Hz'$ , sensitivity of 7 uV/mm, display speed of 339msec with a '60Hz'$  notched filter applied as appropriate. EEG data were recorded continuously and digitally stored.  Video monitoring was available and reviewed as appropriate.   Description: EEG showed continuous generalized low amplitude 5-'8hz'$  theta-alpha activity admixed with intermittent generalized 2-'3hz'$  delta slowing, at times with triphasic morphology. Hyperventilation and photic stimulation were not performed.      ABNORMALITY - Continuous slow, generalized   IMPRESSION: This study was suggestive of moderate to severe diffuse encephalopathy. No seizures or epileptiform discharges were seen throughout the recording.   Penina Reisner O [name]

## 2022-03-13 NOTE — Progress Notes (Signed)
LTM EEG disconnected - no skin breakdown at unhook. [facility] notified.   

## 2022-03-13 NOTE — TOC Initial Note (Signed)
Transition of Care [facility]) - Initial/Assessment Note    Patient Details  Name: David Mendoza MRN: [id number] Date of Birth: July 23, 1940  [facility] CM/SW Contact:    Ella Bodo, RN Phone Number: 03/13/2022, 4:36 PM  Clinical Narrative:                 82 y.o. 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 CT revealed chronic 37m SDH overlying L cerebral hemisphre along with small volume acute SDH along left tentorium.  PTA, pt independent and living at home with spouse.  He currently remains intubated, and is not following commands.  Will continue to follow progress, weaning attempts.    Barriers to Discharge: Continued Medical Work up              Expected Discharge Plan and Services   Discharge Planning Services: CM Consult   [place] the [place] 2 [place]                                     Prior Living Arrangements/Services Living arrangements for the past 2 months: Single Family Home Lives with:: Spouse Patient language and need for interpreter reviewed:: Yes        Need for Family Participation in Patient Care: Yes (Comment) Care giver support system in place?: Yes (comment)   Criminal Activity/Legal Involvement Pertinent to Current Situation/Hospitalization: No - Comment as needed               Emotional Assessment   Attitude/Demeanor/Rapport: Unable to Assess Affect (typically observed): Unable to Assess        Admission diagnosis:  SDH (subdural hematoma) (HCC) [S06.5XAA] TBI (traumatic brain injury) (HSan [facility] [S06.9XAA] Fall, initial encounter [[id number]] Patient Active Problem List   Diagnosis Date Noted   TBI  (traumatic brain injury) ([facility] 03/09/2022   Acute respiratory failure with hypoxia ([facility] 01/15/2022   Multifocal pneumonia 01/15/2022   Stage 3b chronic kidney disease (CKD) (HY[place] 01/15/2022   DM2 (diabetes mellitus, type 2) ([place] 01/15/2022   HTN (hypertension) 01/15/2022   Anemia 07/07/2021   Snores 06/23/2020   Pseudophakia of both eyes 05/12/2020   Moderate nonproliferative diabetic retinopathy of left eye with macular edema associated with type 2 diabetes mellitus (HB[facility]inal 02/04/2020   Choroidal nevus of right eye 12/25/2019   Posterior vitreous detachment of right eye 12/25/2019   Macular pucker, right eye 12/25/2019   Cystoid macular edema of both eyes 12/25/2019   Moderate nonproliferative diabetic retinopathy of right eye with macular edema (HCC) 12/25/2019   Chronic subdural hematoma (HK[place] 04/07/2014   PCP:  HKathalene Frames MD Pharmacy:   [facility] [place] [address] [place]: [phone]: [phone] [place]Phone: [phone]: [phone]    Social Determinants of Health (SDOH) Social History: SDOH Screenings   Food Insecurity: No Food Insecurity (01/15/2022)  Housing: Low Risk  (01/15/2022)  Transportation Needs: No Transportation Needs (01/15/2022)  Utilities: Not At Risk (01/15/2022)  Tobacco Use: Medium Risk (03/09/2022)   SDOH Interventions:     Readmission Risk Interventions     No data to display         Reinaldo Raddle, RN, BSN  Trauma/Neuro ICU Case Manager [phone]

## 2022-03-14 ENCOUNTER — Inpatient Hospital Stay: Payer: Self-pay

## 2022-03-14 ENCOUNTER — Inpatient Hospital Stay (HOSPITAL_COMMUNITY): Payer: Medicare Other

## 2022-03-14 DIAGNOSIS — S065XAA Traumatic subdural hemorrhage with loss of consciousness status unknown, initial encounter: Secondary | ICD-10-CM | POA: Diagnosis not present

## 2022-03-14 DIAGNOSIS — R401 Stupor: Secondary | ICD-10-CM

## 2022-03-14 LAB — BASIC METABOLIC PANEL
Anion gap: 7 (ref 5–15)
BUN: 49 mg/dL — ABNORMAL HIGH (ref 8–23)
CO2: 22 mmol/L (ref 22–32)
Calcium: 7.6 mg/dL — ABNORMAL LOW (ref 8.9–10.3)
Chloride: 113 mmol/L — ABNORMAL HIGH (ref 98–111)
Creatinine, Ser: 2.65 mg/dL — ABNORMAL HIGH (ref 0.61–1.24)
GFR, Estimated: 23 mL/min — ABNORMAL LOW (ref >60)
Glucose, Bld: 166 mg/dL — ABNORMAL HIGH (ref 70–99)
Potassium: 5.3 mmol/L — ABNORMAL HIGH (ref 3.5–5.1)
Sodium: 142 mmol/L (ref 135–145)

## 2022-03-14 LAB — GLUCOSE, CAPILLARY
Glucose-Capillary: 111 mg/dL — ABNORMAL HIGH (ref 70–99)
Glucose-Capillary: 146 mg/dL — ABNORMAL HIGH (ref 70–99)
Glucose-Capillary: 171 mg/dL — ABNORMAL HIGH (ref 70–99)
Glucose-Capillary: 173 mg/dL — ABNORMAL HIGH (ref 70–99)
Glucose-Capillary: 192 mg/dL — ABNORMAL HIGH (ref 70–99)
Glucose-Capillary: 198 mg/dL — ABNORMAL HIGH (ref 70–99)

## 2022-03-14 MED ORDER — FUROSEMIDE 10 MG/ML IJ SOLN
40.0000 mg | Freq: Once | INTRAMUSCULAR | Status: AC
  Administered 2022-03-14: 40 mg via INTRAVENOUS
  Filled 2022-03-14: qty 4

## 2022-03-14 MED ORDER — INSULIN ASPART 100 UNIT/ML IJ SOLN
3.0000 [IU] | INTRAMUSCULAR | Status: DC
  Administered 2022-03-14 – 2022-03-16 (×12): 3 [IU] via SUBCUTANEOUS

## 2022-03-14 MED ORDER — SODIUM ZIRCONIUM CYCLOSILICATE 10 G PO PACK
10.0000 g | PACK | Freq: Once | ORAL | Status: AC
  Administered 2022-03-14: 10 g
  Filled 2022-03-14: qty 1

## 2022-03-14 NOTE — Progress Notes (Addendum)
Subjective: On Fentanyl at a rate of 50  Objective: Current vital signs: BP (!) 147/58   Pulse 70   Temp 98.2 F (36.8 C)   Resp 19   Ht 5' 10.98" (1.803 m)   Wt 92.4 kg   SpO2 100%   BMI 28.43 kg/m  Vital signs in last 24 hours: Temp:  [97.5 F (36.4 C)-98.6 F (37 C)] 98.2 F (36.8 C) (03/05 1000) Pulse Rate:  [65-82] 70 (03/05 1000) Resp:  [14-27] 19 (03/05 1000) BP: (136-180)/(55-86) 147/58 (03/05 1000) SpO2:  [96 %-100 %] 100 % (03/05 1000) FiO2 (%):  [40 %] 40 % (03/05 0825) Weight:  [92.4 kg] 92.4 kg (03/05 0500)  Intake/Output from previous day: 03/04 0701 - 03/05 0700 In: 1312.3 [I.V.:47.3; NG/GT:1265] Out: 3200 [Urine:3200] Intake/Output this shift: Total I/O In: 132.3 [I.V.:22.3; NG/GT:110] Out: 300 [Urine:300] Nutritional status:  Diet Order             Diet NPO time specified  Diet effective now                   HEENT: [place]/AT. No neck stiffness. Lungs: Intubated Ext: Warm and well perfused. Diffuse edema to BUE and BLE.  Genitourinary: Prominent scrotal edema.    Neurologic Exam: Performed while sedated with fentanyl at a rate of 50 Ment: Eyes partially open spontaneously with occasional blinking. Eyes will open almost fully after application of noxious stimuli. Does not fixate gaze on any visual stimuli and does not follow any commands. No attempts to communicate. Flexes BUE weakly and semipurposefully to noxious stimuli.  CN: Pupils equal. No blink to threat bilaterally. Weak corneals bilaterally. Eyes with conjugate roving EOM. No nystagmus. Face symmetric.  Motor: Decreased tone to BUE with weak semipurposeful movement to noxious stimuli.  BLE with weak symmetric withdrawal to plantar stimulation.   Sensory: As above Reflexes: Intact x 4 Cerebellar/Gait: Unable to assess.   Lab Results: Results for orders placed or performed during the hospital encounter of 03/09/22 (from the past 48 hour(s))  Glucose, capillary     Status: Abnormal    Collection Time: 03/12/22 11:19 AM  Result Value Ref Range   Glucose-Capillary 176 (H) 70 - 99 mg/dL    Comment: Glucose reference range applies only to samples taken after fasting for at least 8 hours.  Glucose, capillary     Status: Abnormal   Collection Time: 03/12/22  3:21 PM  Result Value Ref Range   Glucose-Capillary 219 (H) 70 - 99 mg/dL    Comment: Glucose reference range applies only to samples taken after fasting for at least 8 hours.  Glucose, capillary     Status: Abnormal   Collection Time: 03/12/22  7:20 PM  Result Value Ref Range   Glucose-Capillary 179 (H) 70 - 99 mg/dL    Comment: Glucose reference range applies only to samples taken after fasting for at least 8 hours.  Glucose, capillary     Status: Abnormal   Collection Time: 03/12/22 11:28 PM  Result Value Ref Range   Glucose-Capillary 176 (H) 70 - 99 mg/dL    Comment: Glucose reference range applies only to samples taken after fasting for at least 8 hours.  Glucose, capillary     Status: Abnormal   Collection Time: 03/13/22  3:19 AM  Result Value Ref Range   Glucose-Capillary 180 (H) 70 - 99 mg/dL    Comment: Glucose reference range applies only to samples taken after fasting for at least 8 hours.  Triglycerides  Status: None   Collection Time: 03/13/22  4:35 AM  Result Value Ref Range   Triglycerides 45 <150 mg/dL    Comment: Performed at [facility] [address], [place], [place] [address]  CBC     Status: Abnormal   Collection Time: 03/13/22  4:35 AM  Result Value Ref Range   WBC 8.0 4.0 - 10.5 K/uL   RBC 2.30 (L) 4.22 - 5.81 MIL/uL   Hemoglobin 7.1 (L) 13.0 - 17.0 g/dL   HCT 22.6 (L) 39.0 - 52.0 %   MCV 98.3 80.0 - 100.0 fL   MCH 30.9 26.0 - 34.0 pg   MCHC 31.4 30.0 - 36.0 g/dL   RDW 15.1 11.5 - 15.5 %   Platelets 199 150 - 400 K/uL   nRBC 0.0 0.0 - 0.2 %    Comment: Performed at [facility], [place] [address], [place], [place] [id number]  Basic metabolic panel     Status: Abnormal    Collection Time: 03/13/22  4:35 AM  Result Value Ref Range   Sodium 138 135 - 145 mmol/L   Potassium 5.3 (H) 3.5 - 5.1 mmol/L   Chloride 112 (H) 98 - 111 mmol/L   CO2 23 22 - 32 mmol/L   Glucose, Bld 220 (H) 70 - 99 mg/dL    Comment: Glucose reference range applies only to samples taken after fasting for at least 8 hours.   BUN 46 (H) 8 - 23 mg/dL   Creatinine, Ser 2.78 (H) 0.61 - 1.24 mg/dL   Calcium 7.5 (L) 8.9 - 10.3 mg/dL   GFR, Estimated 22 (L) >60 mL/min    Comment: (NOTE) Calculated using the CKD-EPI Creatinine Equation (2021)    Anion gap 3 (L) 5 - 15    Comment: Performed at [facility] [address], [place], [place] [address]  Glucose, capillary     Status: Abnormal   Collection Time: 03/13/22  7:48 AM  Result Value Ref Range   Glucose-Capillary 228 (H) 70 - 99 mg/dL    Comment: Glucose reference range applies only to samples taken after fasting for at least 8 hours.  Glucose, capillary     Status: Abnormal   Collection Time: 03/13/22 11:02 AM  Result Value Ref Range   Glucose-Capillary 222 (H) 70 - 99 mg/dL    Comment: Glucose reference range applies only to samples taken after fasting for at least 8 hours.  I-STAT 7, (LYTES, BLD GAS, ICA, H+H)     Status: Abnormal   Collection Time: 03/13/22 11:23 AM  Result Value Ref Range   pH, Arterial 7.231 (L) 7.35 - 7.45   pCO2 arterial 43.6 32 - 48 mmHg   pO2, Arterial 144 (H) 83 - 108 mmHg   Bicarbonate 18.4 (L) 20.0 - 28.0 mmol/L   TCO2 20 (L) 22 - 32 mmol/L   O2 Saturation 99 %   Acid-base deficit 9.0 (H) 0.0 - 2.0 mmol/L   Sodium 135 135 - 145 mmol/L   Potassium 4.7 3.5 - 5.1 mmol/L   Calcium, Ion 1.05 (L) 1.15 - 1.40 mmol/L   HCT 23.0 (L) 39.0 - 52.0 %   Hemoglobin 7.8 (L) 13.0 - 17.0 g/dL   Patient temperature 97.8 F    Collection site art line    Drawn by RT    Sample type ARTERIAL   Glucose, capillary     Status: Abnormal   Collection Time: 03/13/22  3:34 PM  Result Value Ref Range  Glucose-Capillary 177 (H) 70 - 99 mg/dL    Comment: Glucose reference range applies only to samples taken after fasting for at least 8 hours.  Glucose, capillary     Status: Abnormal   Collection Time: 03/13/22  7:23 PM  Result Value Ref Range   Glucose-Capillary 203 (H) 70 - 99 mg/dL    Comment: Glucose reference range applies only to samples taken after fasting for at least 8 hours.  Glucose, capillary     Status: Abnormal   Collection Time: 03/13/22 11:18 PM  Result Value Ref Range   Glucose-Capillary 199 (H) 70 - 99 mg/dL    Comment: Glucose reference range applies only to samples taken after fasting for at least 8 hours.  Glucose, capillary     Status: Abnormal   Collection Time: 03/14/22  3:16 AM  Result Value Ref Range   Glucose-Capillary 146 (H) 70 - 99 mg/dL    Comment: Glucose reference range applies only to samples taken after fasting for at least 8 hours.  Basic metabolic panel     Status: Abnormal   Collection Time: 03/14/22  5:43 AM  Result Value Ref Range   Sodium 142 135 - 145 mmol/L   Potassium 5.3 (H) 3.5 - 5.1 mmol/L   Chloride 113 (H) 98 - 111 mmol/L   CO2 22 22 - 32 mmol/L   Glucose, Bld 166 (H) 70 - 99 mg/dL    Comment: Glucose reference range applies only to samples taken after fasting for at least 8 hours.   BUN 49 (H) 8 - 23 mg/dL   Creatinine, Ser 2.65 (H) 0.61 - 1.24 mg/dL   Calcium 7.6 (L) 8.9 - 10.3 mg/dL   GFR, Estimated 23 (L) >60 mL/min    Comment: (NOTE) Calculated using the CKD-EPI Creatinine Equation (2021)    Anion gap 7 5 - 15    Comment: Performed at [facility] [address], [place], [place] [address]  Glucose, capillary     Status: Abnormal   Collection Time: 03/14/22  8:03 AM  Result Value Ref Range   Glucose-Capillary 192 (H) 70 - 99 mg/dL    Comment: Glucose reference range applies only to samples taken after fasting for at least 8 hours.    Recent Results (from the past 240 hour(s))  MRSA Next Gen by PCR, Nasal      Status: None   Collection Time: 03/09/22  5:00 PM   Specimen: Nasal Mucosa; Nasal Swab  Result Value Ref Range Status   MRSA by PCR Next Gen NOT DETECTED NOT DETECTED Final    Comment: (NOTE) The GeneXpert MRSA Assay (FDA approved for NASAL specimens only), is one component of a comprehensive MRSA colonization surveillance program. It is not intended to diagnose MRSA infection nor to guide or monitor treatment for MRSA infections. Test performance is not FDA approved in patients less than 65 years old. Performed at [facility], [place] [address], [place], [place] [address]     Lipid Panel Recent Labs    03/13/22 0435  TRIG 45    Studies/Results: [place] EKG SITE RITE  Result Date: 03/14/2022 If Site Rite image not attached, placement could not be confirmed due to current cardiac rhythm.   Medications: Scheduled:  carvedilol  3.125 mg Per Tube BID WC   Chlorhexidine Gluconate Cloth  6 each Topical Q0600   docusate  100 mg Per Tube BID   enoxaparin (LOVENOX) injection  30 mg Subcutaneous Q24H   feeding supplement (  PROSource TF20)  60 mL Per Tube Daily   folic acid  1 mg Per Tube Daily   insulin aspart  0-15 Units Subcutaneous Q4H   levETIRAcetam  500 mg Per Tube BID   mouth rinse  15 mL Mouth Rinse Q2H   pantoprazole (PROTONIX) IV  40 mg Intravenous QHS   polyethylene glycol  17 g Per Tube Daily   thiamine  100 mg Per Tube Daily   Continuous:  feeding supplement (OSMOLITE 1.5 CAL) 55 mL/hr at 03/14/22 0800   fentaNYL infusion INTRAVENOUS Stopped (03/14/22 0721)    Assessment: 82 year old patient with history of traumatic subdural hematoma s/p craniotomy in 2016, DVT, hypertension, hyperlipidemia and diabetes with retinopathy and neuropathy who presented after a fall from standing height in which he struck his head.  He was initially alert and oriented after the fall but then became poorly responsive with posturing upon arrival to the ED and was intubated for airway  protection. CT head demonstrated acute on chronic subdural hematoma. EEG revealed no seizure activity.   - Back on sedation with fentanyl at a rate of 50 this AM due to agitation overnight.   - On exam, he is lethargic and not following commands, but does move BUE semipurposefully to noxious stimuli. His level of consciousness is slightly improved since yesterday.   - There may be a component of delirium and toxic metabolic encephalopathy given increased BUN and creatinine.   - EEG: - LTM EEG report for Monday AM: Continuous slow, generalized. This study is suggestive of moderate to severe diffuse encephalopathy. No seizures or epileptiform discharges were seen throughout the recording. 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 Postconcussive syndrome in addition to mass effect from the subdurals as well as likely toxic metabolic encephalopathy are the most likely precipitants for his current clinical presentation. Possible cortical contusion versus stroke will need to be evaluated with MRI.    Recommendations: - Continue Keppra 500 mg every 12 hours given subdural hematomas - Continue folate 1 mg daily and thiamine 100 mg daily - MRI brain (ordered) - Neurology will continue to follow    35 minutes spent in the neurological evaluation and management of this critically ill patient  Addendum: MRI brain: 1. Small acute infarcts in the right midbrain and right cerebellum. 2. Recent intraparenchymal hemorrhage in the left posterior temporal lobe with surrounding edema, poorly seen/visualized on the prior CT head but potentially progressed. A repeat CT head may allow for more direct comparison. This finding probably is posttraumatic in etiology given reported recent history of trauma. Given the location; however, hemorrhagic venous infarct from dural venous sinus thrombosis is a  differential consideration. Postcontrast imaging could further evaluate if clinically warranted. 3. When comparing across modalities to recent CT head, similar appearance of bilateral cerebral convexity subdural fluid collections measuring up to 11 mm in thickness on the left and 8 mm on the right. 4. Similar small volume of acute/recent subdural hemorrhage along the left tentorial leaflet.  Addendum: - MRI brain images personally reviewed - Will need stroke work up - Will not initiate antiplatelet agent for his strokes at this time given the subdurals and left temporal lobe hemorrhage - No emergent changes to current management plan   LOS: 5 days   '@Electronically'$  signed: Dr. Kerney Elbe 03/14/2022  10:40 AM

## 2022-03-14 NOTE — Progress Notes (Signed)
Patient ID: David Mendoza, male   DOB: 1940-03-17, 82 y.o.   MRN: [id number] Patient appears more alert today still remains on ventilator.  Awaiting results of MRI.  Will follow-up when available.

## 2022-03-14 NOTE — Progress Notes (Signed)
Pt transported on vent from 4N16 to MRI and back without any complications. RN at bedside RT will monitor.

## 2022-03-14 NOTE — Progress Notes (Signed)
Attempted to call wife for PICC consent but no answer.

## 2022-03-14 NOTE — Progress Notes (Signed)
Patient ID: David Mendoza, male   DOB: August 20, 1940, 82 y.o.   MRN: [id number] Follow up - Trauma Critical Care   Patient Details:    David Mendoza is an 82 y.o. male.  Lines/tubes : Airway 7.5 mm (Active)     Airway 8 mm (Active)  Secured at (cm) 26 cm 03/14/22 0825  Measured From Lips 03/14/22 0825  Secured Location Left 03/14/22 0825  Secured By [employer] 03/14/22 0825  Tube Holder Repositioned Yes 03/14/22 0825  Prone position No 03/14/22 0345  Cuff Pressure (cm H2O) Clear OR 27-39 CmH2O 03/14/22 0825  Site Condition Drainage (Comment) 03/14/22 0825     NG/OG Vented/Dual Lumen Oral External length of tube (Active)  Tube Position (Required) Marking at nare/corner of mouth 03/14/22 0756  Measurement (cm) (Required) 60 cm 03/14/22 0756  Ongoing Placement Verification (Required) (See row information) Yes 03/14/22 0756  Site Assessment Clean, Dry, Intact 03/14/22 0756  Interventions Cleansed 03/13/22 2000  Status Feeding 03/14/22 0756  Drainage Appearance None 03/11/22 0800  Intake (mL) 40 mL 03/12/22 2109  Output (mL) 0 mL 03/11/22 1600     Urethral Catheter Amy Stanley Straight-tip 16 Fr. (Active)  Indication for Insertion or Continuance of Catheter Therapy based on hourly urine output monitoring and documentation for critical condition (NOT STRICT I&O) 03/14/22 0756  Site Assessment Clean, Dry, Intact 03/14/22 0756  Catheter Maintenance Bag below level of bladder;Catheter secured;Drainage bag/tubing not touching floor;Insertion date on drainage bag;No dependent loops;Seal intact 03/14/22 0756  Collection Container Standard drainage bag 03/14/22 0756  Securement Method Securing device (Describe) 03/14/22 0756  Urinary Catheter Interventions (if applicable) Unclamped [id number]  Input (mL) 0 mL 03/11/22 1600  Output (mL) 175 mL 03/14/22 0700    Microbiology/Sepsis markers: Results for orders placed or performed during the hospital encounter of 03/09/22  MRSA  Next Gen by PCR, Nasal     Status: None   Collection Time: 03/09/22  5:00 PM   Specimen: Nasal Mucosa; Nasal Swab  Result Value Ref Range Status   MRSA by PCR Next Gen NOT DETECTED NOT DETECTED Final    Comment: (NOTE) The GeneXpert MRSA Assay (FDA approved for NASAL specimens only), is one component of a comprehensive MRSA colonization surveillance program. It is not intended to diagnose MRSA infection nor to guide or monitor treatment for MRSA infections. Test performance is not FDA approved in patients less than 73 years old. Performed at [facility], [place] [address], [place], [place] [address]     Anti-infectives:  Anti-infectives (From admission, onward)    Start     Dose/Rate Route Frequency Ordered Stop   03/09/22 1400  ceFAZolin (ANCEF) IVPB 2g/100 mL premix        2 g 200 mL/hr over 30 Minutes Intravenous  Once 03/09/22 1346 03/09/22 1600      Consults: Treatment Team:  Md, Trauma, MD Kristeen Miss, MD    Studies:    Events:  Subjective:    Overnight Issues: gets restless  Objective:  Vital signs for last 24 hours: Temp:  [96.1 F (35.6 C)-98.6 F (37 C)] 98.6 F (37 C) (03/05 0800) Pulse Rate:  [65-82] 71 (03/05 0800) Resp:  [14-27] 14 (03/05 0800) BP: (136-180)/(55-86) 158/65 (03/05 0800) SpO2:  [96 %-100 %] 99 % (03/05 0825) FiO2 (%):  [40 %] 40 % (03/05 0825) Weight:  [92.4 kg] 92.4 kg (03/05 0500)  Hemodynamic parameters for last 24 hours:    Intake/Output from previous day: 03/04  0701 - 03/05 0700 In: 1312.3 [I.V.:47.3; NG/GT:1265] Out: 3200 [Urine:3200]  Intake/Output this shift: Total I/O In: 132.3 [I.V.:22.3; NG/GT:110] Out: -   Vent settings for last 24 hours: Vent Mode: PRVC FiO2 (%):  [40 %] 40 % Set Rate:  [16 bmp] 16 bmp Vt Set:  [600 mL] 600 mL PEEP:  [5 cmH20] 5 cmH20 Pressure Support:  [8 cmH20] 8 cmH20  Physical Exam:  General: on vent Neuro: opens eyes to voice, MAE but not F/C HEENT/Neck: ETT Resp: clear  to auscultation bilaterally CVS: RRR GI: soft, NT Extremities: edema 3+  Results for orders placed or performed during the hospital encounter of 03/09/22 (from the past 24 hour(s))  Glucose, capillary     Status: Abnormal   Collection Time: 03/13/22 11:02 AM  Result Value Ref Range   Glucose-Capillary 222 (H) 70 - 99 mg/dL  I-STAT 7, (LYTES, BLD GAS, ICA, H+H)     Status: Abnormal   Collection Time: 03/13/22 11:23 AM  Result Value Ref Range   pH, Arterial 7.231 (L) 7.35 - 7.45   pCO2 arterial 43.6 32 - 48 mmHg   pO2, Arterial 144 (H) 83 - 108 mmHg   Bicarbonate 18.4 (L) 20.0 - 28.0 mmol/L   TCO2 20 (L) 22 - 32 mmol/L   O2 Saturation 99 %   Acid-base deficit 9.0 (H) 0.0 - 2.0 mmol/L   Sodium 135 135 - 145 mmol/L   Potassium 4.7 3.5 - 5.1 mmol/L   Calcium, Ion 1.05 (L) 1.15 - 1.40 mmol/L   HCT 23.0 (L) 39.0 - 52.0 %   Hemoglobin 7.8 (L) 13.0 - 17.0 g/dL   Patient temperature 97.8 F    Collection site art line    Drawn by RT    Sample type ARTERIAL   Glucose, capillary     Status: Abnormal   Collection Time: 03/13/22  3:34 PM  Result Value Ref Range   Glucose-Capillary 177 (H) 70 - 99 mg/dL  Glucose, capillary     Status: Abnormal   Collection Time: 03/13/22  7:23 PM  Result Value Ref Range   Glucose-Capillary 203 (H) 70 - 99 mg/dL  Glucose, capillary     Status: Abnormal   Collection Time: 03/13/22 11:18 PM  Result Value Ref Range   Glucose-Capillary 199 (H) 70 - 99 mg/dL  Glucose, capillary     Status: Abnormal   Collection Time: 03/14/22  3:16 AM  Result Value Ref Range   Glucose-Capillary 146 (H) 70 - 99 mg/dL  Basic metabolic panel     Status: Abnormal   Collection Time: 03/14/22  5:43 AM  Result Value Ref Range   Sodium 142 135 - 145 mmol/L   Potassium 5.3 (H) 3.5 - 5.1 mmol/L   Chloride 113 (H) 98 - 111 mmol/L   CO2 22 22 - 32 mmol/L   Glucose, Bld 166 (H) 70 - 99 mg/dL   BUN 49 (H) 8 - 23 mg/dL   Creatinine, Ser 2.65 (H) 0.61 - 1.24 mg/dL   Calcium 7.6 (L)  8.9 - 10.3 mg/dL   GFR, Estimated 23 (L) >60 mL/min   Anion gap 7 5 - 15  Glucose, capillary     Status: Abnormal   Collection Time: 03/14/22  8:03 AM  Result Value Ref Range   Glucose-Capillary 192 (H) 70 - 99 mg/dL    Assessment & Plan: Present on Admission:  TBI (traumatic brain injury) ([place])    LOS: 5 days   Additional comments:I reviewed the patient's  new clinical lab test results. / Fall Bilateral SDH, falcine hematoma - NSGY Dr. Ellene Route consulted. Suspects mostly chronic small acute component with h/o craniotomy 2016 for b/l SDH. Keppra for seizure prophylaxis. Repeat CT head 3/1 stable. Does not fully explain encephalopathy, EEG  Scalp laceration with hematoma - stapled by EDP. Removal in 10-14 days Acute hypoxic ventilator dependent respiratory failure - weaning well, MS will need to improve for extubation Pancreatic duct stricture - incidental finding. Will need outpatient follow up HTN - home Coreg CKD stage 4 H/o multiple falls - not on anticoagulation currently per med list review CT c spine negative - D/C collar  FEN: hyperkalemia - lokelma and lasix x 1 again ID: ancef in ED VTE: SCDs. Lovenox Foley - Placed for I/O, penis is extremely swollen, diuresing today Dispo: ICU, EEG no SZ, MRI per Neurology, weaning  Critical Care Total Time*: 35 Minutes  Georganna Skeans, MD, MPH, FACS Trauma & General Surgery Use [email or web address] to contact on call provider  03/14/2022  *Care during the described time interval was provided by me. I have reviewed this patient's available data, including medical history, events of note, physical examination and test results as part of my evaluation.

## 2022-03-15 ENCOUNTER — Inpatient Hospital Stay (HOSPITAL_COMMUNITY): Payer: Medicare Other

## 2022-03-15 DIAGNOSIS — I6302 Cerebral infarction due to thrombosis of basilar artery: Secondary | ICD-10-CM

## 2022-03-15 DIAGNOSIS — I639 Cerebral infarction, unspecified: Secondary | ICD-10-CM

## 2022-03-15 DIAGNOSIS — S065XAA Traumatic subdural hemorrhage with loss of consciousness status unknown, initial encounter: Secondary | ICD-10-CM | POA: Diagnosis not present

## 2022-03-15 LAB — GLUCOSE, CAPILLARY
Glucose-Capillary: 104 mg/dL — ABNORMAL HIGH (ref 70–99)
Glucose-Capillary: 117 mg/dL — ABNORMAL HIGH (ref 70–99)
Glucose-Capillary: 165 mg/dL — ABNORMAL HIGH (ref 70–99)
Glucose-Capillary: 150 mg/dL — ABNORMAL HIGH (ref 70–99)
Glucose-Capillary: 200 mg/dL — ABNORMAL HIGH (ref 70–99)
Glucose-Capillary: 270 mg/dL — ABNORMAL HIGH (ref 70–99)

## 2022-03-15 LAB — BASIC METABOLIC PANEL
Anion gap: 7 (ref 5–15)
BUN: 52 mg/dL — ABNORMAL HIGH (ref 8–23)
CO2: 30 mmol/L (ref 22–32)
Calcium: 8 mg/dL — ABNORMAL LOW (ref 8.9–10.3)
Chloride: 109 mmol/L (ref 98–111)
Creatinine, Ser: 2.52 mg/dL — ABNORMAL HIGH (ref 0.61–1.24)
GFR, Estimated: 25 mL/min — ABNORMAL LOW (ref >60)
Glucose, Bld: 80 mg/dL (ref 70–99)
Potassium: 4.8 mmol/L (ref 3.5–5.1)
Sodium: 146 mmol/L — ABNORMAL HIGH (ref 135–145)

## 2022-03-15 LAB — CBC
HCT: 23.5 % — ABNORMAL LOW (ref 39.0–52.0)
Hemoglobin: 7.2 g/dL — ABNORMAL LOW (ref 13.0–17.0)
MCH: 30.4 pg (ref 26.0–34.0)
MCHC: 30.6 g/dL (ref 30.0–36.0)
MCV: 99.2 fL (ref 80.0–100.0)
Platelets: 254 10*3/uL (ref 150–400)
RBC: 2.37 MIL/uL — ABNORMAL LOW (ref 4.22–5.81)
RDW: 14.5 % (ref 11.5–15.5)
WBC: 6.6 10*3/uL (ref 4.0–10.5)
nRBC: 0 % (ref 0.0–0.2)

## 2022-03-15 LAB — LIPID PANEL
Cholesterol: 120 mg/dL (ref 0–200)
HDL: 33 mg/dL — ABNORMAL LOW (ref >40)
LDL Cholesterol: 79 mg/dL (ref 0–99)
Total CHOL/HDL Ratio: 3.6 RATIO
Triglycerides: 39 mg/dL (ref <150)
VLDL: 8 mg/dL (ref 0–40)

## 2022-03-15 LAB — VITAMIN B1: Vitamin B1 (Thiamine): 103.8 nmol/L (ref 66.5–200.0)

## 2022-03-15 MED ORDER — HYDRALAZINE HCL 20 MG/ML IJ SOLN
5.0000 mg | INTRAMUSCULAR | Status: AC | PRN
  Administered 2022-03-15 – 2022-03-16 (×2): 20 mg via INTRAVENOUS
  Filled 2022-03-15 (×2): qty 1

## 2022-03-15 MED ORDER — AMLODIPINE BESYLATE 10 MG PO TABS
10.0000 mg | ORAL_TABLET | Freq: Every day | ORAL | Status: DC
  Administered 2022-03-15 – 2022-03-20 (×6): 10 mg
  Filled 2022-03-15 (×6): qty 1

## 2022-03-15 MED ORDER — CARVEDILOL 12.5 MG PO TABS
12.5000 mg | ORAL_TABLET | Freq: Two times a day (BID) | ORAL | Status: DC
  Administered 2022-03-16 – 2022-03-20 (×8): 12.5 mg
  Filled 2022-03-15 (×9): qty 1

## 2022-03-15 MED ORDER — SODIUM CHLORIDE 0.9% FLUSH: 10.0000 mL | INTRAVENOUS | Status: DC | PRN

## 2022-03-15 MED ORDER — SODIUM CHLORIDE 0.9% FLUSH
10.0000 mL | Freq: Two times a day (BID) | INTRAVENOUS | Status: DC
  Administered 2022-03-15: 20 mL
  Administered 2022-03-15: 10 mL
  Administered 2022-03-16: 20 mL
  Administered 2022-03-16 – 2022-03-19 (×7): 10 mL
  Administered 2022-03-20: 20 mL

## 2022-03-15 MED ORDER — AMLODIPINE BESYLATE 10 MG PO TABS: 10.0000 mg | ORAL_TABLET | Freq: Every day | ORAL | Status: DC

## 2022-03-15 MED ORDER — FENTANYL CITRATE PF 50 MCG/ML IJ SOSY: 25.0000 ug | PREFILLED_SYRINGE | INTRAMUSCULAR | Status: DC | PRN

## 2022-03-15 MED ORDER — LABETALOL HCL 5 MG/ML IV SOLN
5.0000 mg | INTRAVENOUS | Status: DC | PRN
  Administered 2022-03-15 – 2022-03-16 (×3): 20 mg via INTRAVENOUS
  Administered 2022-03-17: 10 mg via INTRAVENOUS
  Filled 2022-03-15 (×4): qty 4

## 2022-03-15 NOTE — Progress Notes (Signed)
Patient ID: David Mendoza, male   DOB: 05-08-1940, 82 y.o.   MRN: [id number] Follow up - Trauma Critical Care   Patient Details:    David Mendoza is an 82 y.o. male.  Lines/tubes : Airway 7.5 mm (Active)     Airway 8 mm (Active)  Secured at (cm) 26 cm 03/15/22 0740  Measured From Lips 03/15/22 0740  Secured Location Left 03/15/22 0740  Secured By [employer] 03/15/22 0740  Tube Holder Repositioned Yes 03/15/22 0740  Prone position No 03/15/22 0740  Cuff Pressure (cm H2O) Clear OR 27-39 CmH2O 03/15/22 0740  Site Condition Dry 03/15/22 0740     PICC Double Lumen 0000000 Right Basilic 40 cm 0 cm (Active)  Indication for Insertion or Continuance of Line Poor Vasculature-patient has had multiple peripheral attempts or PIVs lasting less than 24 hours 03/15/22 0854  Exposed Catheter (cm) 0 cm 03/15/22 0854  Site Assessment Clean, Dry, Intact 03/15/22 0854  Lumen #1 Status Flushed;Saline locked;Blood return noted 03/15/22 0854  Lumen #2 Status Flushed;Saline locked;Blood return noted 03/15/22 0854  Dressing Type Transparent;Securing device 03/15/22 0854  Dressing Status Antimicrobial disc in place;Clean, Dry, Intact 03/15/22 0854  Safety Lock Not Applicable 0000000 0000000  Line Care Connections checked and tightened 03/15/22 0854  Line Adjustment (NICU/IV Team Only) No 03/15/22 0854  Dressing Intervention New dressing;Other (Comment) 03/15/22 0854  Dressing Change Due 03/22/22 03/15/22 0854     NG/OG Vented/Dual Lumen Oral External length of tube (Active)  Tube Position (Required) Marking at nare/corner of mouth 03/15/22 0800  Measurement (cm) (Required) 60 cm 03/14/22 2000  Ongoing Placement Verification (Required) (See row information) Yes 03/15/22 0800  Site Assessment Clean, Dry, Intact 03/15/22 0800  Interventions Cleansed 03/15/22 0800  Status Feeding 03/14/22 2000  Drainage Appearance None 03/15/22 0800  Intake (mL) 40 mL 03/12/22 2109  Output (mL) 0 mL 03/11/22  1600     Urethral Catheter Amy Stanley Straight-tip 16 Fr. (Active)  Indication for Insertion or Continuance of Catheter Therapy based on hourly urine output monitoring and documentation for critical condition (NOT STRICT I&O) 03/15/22 0800  Site Assessment Clean, Dry, Intact 03/15/22 0800  Catheter Maintenance Bag below level of bladder 03/15/22 0800  Collection Container Standard drainage bag 03/15/22 0800  Securement Method Securing device (Describe) 03/15/22 0800  Urinary Catheter Interventions (if applicable) Unclamped [id number]  Input (mL) 0 mL 03/11/22 1600  Output (mL) 700 mL 03/15/22 0600    Microbiology/Sepsis markers: Results for orders placed or performed during the hospital encounter of 03/09/22  MRSA Next Gen by PCR, Nasal     Status: None   Collection Time: 03/09/22  5:00 PM   Specimen: Nasal Mucosa; Nasal Swab  Result Value Ref Range Status   MRSA by PCR Next Gen NOT DETECTED NOT DETECTED Final    Comment: (NOTE) The GeneXpert MRSA Assay (FDA approved for NASAL specimens only), is one component of a comprehensive MRSA colonization surveillance program. It is not intended to diagnose MRSA infection nor to guide or monitor treatment for MRSA infections. Test performance is not FDA approved in patients less than 91 years old. Performed at [facility], [place] [address] [place], [place] [address]     Anti-infectives:  Anti-infectives (From admission, onward)    Start     Dose/Rate Route Frequency Ordered Stop   03/09/22 1400  ceFAZolin (ANCEF) IVPB 2g/100 mL premix        2 g 200 mL/hr over 30 Minutes Intravenous  Once 03/09/22 1346 03/09/22 1600      Consults: Treatment Team:  Md, Trauma, MD Kristeen Miss, MD    Studies:    Events:  Subjective:    Overnight Issues: F/C  Objective:  Vital signs for last 24 hours: Temp:  [98 F (36.7 C)-98.6 F (37 C)] 98.2 F (36.8 C) (03/06 0800) Pulse Rate:  [64-71] 70 (03/06 0800) Resp:  [12-27]  27 (03/06 0800) BP: (115-168)/(49-99) 168/75 (03/06 0800) SpO2:  [97 %-100 %] 100 % (03/06 0800) FiO2 (%):  [40 %] 40 % (03/06 0740) Weight:  [95.2 kg] 95.2 kg (03/06 0500)  Hemodynamic parameters for last 24 hours:    Intake/Output from previous day: 03/05 0701 - 03/06 0700 In: 1540.3 [I.V.:165.3; NG/GT:1375] Out: 2430 [Urine:2430]  Intake/Output this shift: Total I/O In: 59.5 [I.V.:4.5; NG/GT:55] Out: -   Vent settings for last 24 hours: Vent Mode: PSV;CPAP FiO2 (%):  [40 %] 40 % Set Rate:  [16 bmp] 16 bmp Vt Set:  [600 mL] 600 mL PEEP:  [5 cmH20] 5 cmH20 Pressure Support:  [5 cmH20] 5 cmH20 Plateau Pressure:  [17 cmH20-18 cmH20] 17 cmH20  Physical Exam:  General: alert and on vent Neuro: F/C well HEENT/Neck: ETT Resp: clear to auscultation bilaterally CVS: RRR GI: soft, NT Extremities: some edema  Results for orders placed or performed during the hospital encounter of 03/09/22 (from the past 24 hour(s))  Glucose, capillary     Status: Abnormal   Collection Time: 03/14/22 11:10 AM  Result Value Ref Range   Glucose-Capillary 198 (H) 70 - 99 mg/dL  Glucose, capillary     Status: Abnormal   Collection Time: 03/14/22  3:17 PM  Result Value Ref Range   Glucose-Capillary 173 (H) 70 - 99 mg/dL  Glucose, capillary     Status: Abnormal   Collection Time: 03/14/22  7:13 PM  Result Value Ref Range   Glucose-Capillary 111 (H) 70 - 99 mg/dL  Glucose, capillary     Status: Abnormal   Collection Time: 03/14/22 11:30 PM  Result Value Ref Range   Glucose-Capillary 171 (H) 70 - 99 mg/dL  Glucose, capillary     Status: Abnormal   Collection Time: 03/15/22  3:09 AM  Result Value Ref Range   Glucose-Capillary 104 (H) 70 - 99 mg/dL  CBC     Status: Abnormal   Collection Time: 03/15/22  5:05 AM  Result Value Ref Range   WBC 6.6 4.0 - 10.5 K/uL   RBC 2.37 (L) 4.22 - 5.81 MIL/uL   Hemoglobin 7.2 (L) 13.0 - 17.0 g/dL   HCT 23.5 (L) 39.0 - 52.0 %   MCV 99.2 80.0 - 100.0 fL    MCH 30.4 26.0 - 34.0 pg   MCHC 30.6 30.0 - 36.0 g/dL   RDW 14.5 11.5 - 15.5 %   Platelets 254 150 - 400 K/uL   nRBC 0.0 0.0 - 0.2 %  Basic metabolic panel     Status: Abnormal   Collection Time: 03/15/22  5:05 AM  Result Value Ref Range   Sodium 146 (H) 135 - 145 mmol/L   Potassium 4.8 3.5 - 5.1 mmol/L   Chloride 109 98 - 111 mmol/L   CO2 30 22 - 32 mmol/L   Glucose, Bld 80 70 - 99 mg/dL   BUN 52 (H) 8 - 23 mg/dL   Creatinine, Ser 2.52 (H) 0.61 - 1.24 mg/dL   Calcium 8.0 (L) 8.9 - 10.3 mg/dL   GFR, Estimated 25 (L) >60 mL/min  Anion gap 7 5 - 15  Lipid panel     Status: Abnormal   Collection Time: 03/15/22  5:05 AM  Result Value Ref Range   Cholesterol 120 0 - 200 mg/dL   Triglycerides 39 <150 mg/dL   HDL 33 (L) >40 mg/dL   Total CHOL/HDL Ratio 3.6 RATIO   VLDL 8 0 - 40 mg/dL   LDL Cholesterol 79 0 - 99 mg/dL  Glucose, capillary     Status: Abnormal   Collection Time: 03/15/22  8:12 AM  Result Value Ref Range   Glucose-Capillary 117 (H) 70 - 99 mg/dL    Assessment & Plan: Present on Admission:  TBI (traumatic brain injury) ([place])    LOS: 6 days   Additional comments:I reviewed the patient's new clinical lab test results. / Fall Bilateral SDH, falcine hematoma - NSGY Dr. Ellene Route consulted. Suspects mostly chronic small acute component with h/o craniotomy 2016 for b/l SDH. Keppra for seizure prophylaxis. Repeat CT head 3/1 stable. EEG done, MRI 3/6 shows small acute infarcts R midbrain and R cerebellum, L post temp ICC, B SDH - stroke W/U per Neurology including MRA MRV Scalp laceration with hematoma - stapled by EDP. Removal in 10-14 days Acute hypoxic ventilator dependent respiratory failure - extubate now Pancreatic duct stricture - incidental finding. Will need outpatient follow up HTN - home Coreg CKD stage 4 H/o multiple falls - not on anticoagulation currently per med list review CT c spine negative - D/C collar  FEN: hyperkalemia better ID: ancef in ED VTE:  SCDs. Lovenox Foley - Placed for I/O, penis is extremely swollen, diuresing today Dispo: ICU, extubation, Neurology W/U Critical Care Total Time*: 38 Minutes  Georganna Skeans, MD, MPH, FACS Trauma & General Surgery Use [email or web address] to contact on call provider  03/15/2022  *Care during the described time interval was provided by me. I have reviewed this patient's available data, including medical history, events of note, physical examination and test results as part of my evaluation.

## 2022-03-15 NOTE — Progress Notes (Signed)
Verbal order from Dr. Erlinda Hong that new BP goal is < 160.

## 2022-03-15 NOTE — Progress Notes (Signed)
Pt placed on PSV/CPAP by RT. Pt tolerating well at this tim, vitals stable, RN aware, RT will monitor.

## 2022-03-15 NOTE — Procedures (Signed)
Extubation Procedure Note  Patient Details:   Name: David Mendoza DOB: 1940/08/16 MRN: [id number]   Airway Documentation:    Vent end date: 03/15/22 Vent end time: 0930   Evaluation  O2 sats: stable throughout Complications: No apparent complications Patient did tolerate procedure well. Bilateral Breath Sounds: Diminished, Clear   Yes  Pt extubated to 2L [place]. No stridor noted, cuff leak present, pt tolerated well. RN at bedside,vitals stable, no increased WOB noted, RT will monitor.   [name] 03/15/2022, 9:32 AM

## 2022-03-15 NOTE — Progress Notes (Signed)
Carotid duplex bilateral study completed.   Please see CV Proc for preliminary results.   Kimbella Heisler, RDMS, RVT  

## 2022-03-15 NOTE — Progress Notes (Signed)
Peripherally Inserted Central Catheter Placement  The IV Nurse has discussed with the patient and/or persons authorized to consent for the patient, the purpose of this procedure and the potential benefits and risks involved with this procedure.  The benefits include less needle sticks, lab draws from the catheter, and the patient may be discharged home with the catheter. Risks include, but not limited to, infection, bleeding, blood clot (thrombus formation), and puncture of an artery; nerve damage and irregular heartbeat and possibility to perform a PICC exchange if needed/ordered by physician.  Alternatives to this procedure were also discussed.  B[employer] Power PICC patient education guide, fact sheet on infection prevention and patient information card has been provided to patient /or left at bedside.    PICC Placement Documentation  PICC Double Lumen 0000000 Right Basilic 40 cm 0 cm (Active)  Indication for Insertion or Continuance of Line Poor Vasculature-patient has had multiple peripheral attempts or PIVs lasting less than 24 hours 03/15/22 0854  Exposed Catheter (cm) 0 cm 03/15/22 0854  Site Assessment Clean, Dry, Intact 03/15/22 0854  Lumen #1 Status Flushed;Saline locked;Blood return noted 03/15/22 0854  Lumen #2 Status Flushed;Saline locked;Blood return noted 03/15/22 0854  Dressing Type Transparent;Securing device 03/15/22 0854  Dressing Status Antimicrobial disc in place;Clean, Dry, Intact 03/15/22 0854  Safety Lock Not Applicable 0000000 0000000  Line Care Connections checked and tightened 03/15/22 0854  Line Adjustment (NICU/IV Team Only) No 03/15/22 0854  Dressing Intervention New dressing;Other (Comment) 03/15/22 0854  Dressing Change Due 03/22/22 03/15/22 0854    Patient's daughter, Victorio Palm, signed PICC consent via telephone. Verified by 2 PICC RNs. Bilateral arms very swollen and has pitting edema. RUA is already red on the [facility] with infiltration from previous PIV and has a rash  in axilla prior to PICC insertion.   Enos Fling 03/15/2022, 8:56 AM

## 2022-03-15 NOTE — Progress Notes (Addendum)
STROKE TEAM PROGRESS NOTE   INTERVAL HISTORY Patient is seen in his room with no family members at the bedside.  He has been extubated this morning and is doing well.  He is able to move all 4 extremities with good antigravity strength but is nonverbal and does not yet follow commands.  Patient was unable to go to MRI/MRV due to respiratory status soon after extubation, but head CT performed today demonstrates no change in subdural hematomas and slightly increased size of IPH and left posterior temporal lobe  Vitals:   03/15/22 0900 03/15/22 1000 03/15/22 1200 03/15/22 1300  BP: (!) 166/79 (!) 198/81 (!) 176/63 (!) 198/89  Pulse: 74 80 86 88  Resp: 19 (!) 23 (!) 23 (!) 33  Temp:   (!) 97.1 F (36.2 C)   TempSrc:   Axillary   SpO2: 100% 93% 96% 96%  Weight:      Height:       CBC:  Recent Labs  Lab 03/13/22 0435 03/13/22 1123 03/15/22 0505  WBC 8.0  --  6.6  HGB 7.1* 7.8* 7.2*  HCT 22.6* 23.0* 23.5*  MCV 98.3  --  99.2  PLT 199  --  0000000   Basic Metabolic Panel:  Recent Labs  Lab 03/11/22 1852 03/12/22 0443 03/13/22 0435 03/14/22 0543 03/15/22 0505  NA  --  140   < > 142 146*  K  --  4.9   < > 5.3* 4.8  CL  --  114*   < > 113* 109  CO2  --  21*   < > 22 30  GLUCOSE  --  100*   < > 166* 80  BUN  --  39*   < > 49* 52*  CREATININE  --  2.71*   < > 2.65* 2.52*  CALCIUM  --  7.0*   < > 7.6* 8.0*  MG 1.8 1.9  --   --   --   PHOS 3.0 3.2  --   --   --    < > = values in this interval not displayed.   Lipid Panel:  Recent Labs  Lab 03/15/22 0505  CHOL 120  TRIG 39  HDL 33*  CHOLHDL 3.6  VLDL 8  LDLCALC 79   HgbA1c: No results for input(s): "HGBA1C" in the last 168 hours. Urine Drug Screen: No results for input(s): "LABOPIA", "COCAINSCRNUR", "LABBENZ", "AMPHETMU", "THCU", "LABBARB" in the last 168 hours.  Alcohol Level  Recent Labs  Lab 03/09/22 1201  ETH <10    IMAGING past 24 hours CT HEAD WO CONTRAST (5MM)  Result Date: 03/15/2022 CLINICAL DATA:  Stroke  follow-up EXAM: CT HEAD WITHOUT CONTRAST TECHNIQUE: Contiguous axial images were obtained from the base of the skull through the vertex without intravenous contrast. RADIATION DOSE REDUCTION: This exam was performed according to the departmental dose-optimization program which includes automated exposure control, adjustment of the mA and/or kV according to patient size and/or use of iterative reconstruction technique. COMPARISON:  CT Head 03/10/22, MRI Head 03/14/22 FINDINGS: Brain: There is no significant interval change in size of the right cerebral convexity extra-axial fluid collection. There is minimal interval increase in size of the left cerebral convexity fluid collection. There is no evidence of midline shift. No significant mass effect. Redemonstrated there is an acute infarct in the right cerebellum, as seen on recent prior brain MRI. There is also redemonstration of a parenchymal hematoma in the left posterior temporal lobe with surrounding edema. This has increased  in size when compared to prior CT brain dated 03/10/2022. Size and shape of the ventricular system is unchanged from prior exam. Unchanged small volume subdural blood products along the left tentorial leaflet and the posterior falx. Vascular: No hyperdense vessel or unexpected calcification. Skull: Burr hole craniotomies in the bilateral frontal calvarium skin staples over the left parietal scalp. Sinuses/Orbits: Trace bilateral mastoid effusions, right-greater-than-left. Pansinus mucosal thickening. Bilateral lens replacement. Orbits are otherwise unremarkable. Other: None. IMPRESSION: 1. No significant interval change in size of the right cerebral convexity extra-axial fluid collection. Stable to minimal interval increase in size of the left cerebral convexity extra-axial fluid collection. No significant mass effect or midline shift. 2. Unchanged acute infarct in the right cerebellum. 3. Unchanged small volume subdural blood products along the  left tentorial leaflet and posterior falx. 4. Increased size of parenchymal hematoma in the left posterior temporal lobe when compared to prior CT brain. The hematoma was poorly visualized on prior imaging and would have been difficult to differentiate from artifact prospectively. Electronically Signed   By: Marin Roberts M.D.   On: 03/15/2022 14:03   DG Abd Portable 1V  Result Date: 03/15/2022 CLINICAL DATA:  Feeding tube placement EXAM: PORTABLE ABDOMEN - 1 VIEW COMPARISON:  KUB 03/10/2022 FINDINGS: The enteric catheter tip is in the stomach. There is nonobstructive bowel gas pattern. There is no abnormal soft tissue calcification. There is no definite free intraperitoneal air. There is a probable small right pleural effusion, incompletely imaged. The left lung base is clear. IMPRESSION: Enteric catheter tip in the stomach. Electronically Signed   By: Valetta Mole M.D.   On: 03/15/2022 12:30   MR BRAIN WO CONTRAST  Result Date: 03/14/2022 CLINICAL DATA:  Neuro deficit, acute, stroke suspected EXAM: MRI HEAD WITHOUT CONTRAST TECHNIQUE: Multiplanar, multiecho pulse sequences of the brain and surrounding structures were obtained without intravenous contrast. COMPARISON:  None Available. FINDINGS: Brain: Small acute infarcts in the right midbrain and right cerebellum. Recent intraparenchymal hemorrhage in the left posterior temporal lobe with surrounding edema, poorly seen/visualized on the prior CT head but potentially progressed. Associated regional mass effect without midline shift. When comparing across modalities to recent CT head, similar appearance of bilateral cerebral convexity subdural fluid collections measuring up to 11 mm in thickness on the left and 8 mm on the right. Similar small volume of acute/recent subdural hemorrhage along the left tentorial leaflet. Cerebral atrophy. Vascular: Major arterial flow voids are maintained skull base. Skull and upper cervical spine: Normal marrow signal.  Sinuses/Orbits: Paranasal sinus mucosal thickening. No acute orbital findings. Other: Moderate right and small left mastoid effusions. IMPRESSION: 1. Small acute infarcts in the right midbrain and right cerebellum. 2. Recent intraparenchymal hemorrhage in the left posterior temporal lobe with surrounding edema, poorly seen/visualized on the prior CT head but potentially progressed. A repeat CT head may allow for more direct comparison. This finding probably is posttraumatic in etiology given reported recent history of trauma. Given the location; however, hemorrhagic venous infarct from dural venous sinus thrombosis is a differential consideration. Postcontrast imaging could further evaluate if clinically warranted. 3. When comparing across modalities to recent CT head, similar appearance of bilateral cerebral convexity subdural fluid collections measuring up to 11 mm in thickness on the left and 8 mm on the right. 4. Similar small volume of acute/recent subdural hemorrhage along the left tentorial leaflet. These results will be called to the ordering clinician or representative by the Radiologist Assistant, and communication documented in the PACS or [place]  Dashboard. Electronically Signed   By: Margaretha Sheffield M.D.   On: 03/14/2022 15:01    PHYSICAL EXAM General: Alert, chronically ill-appearing elderly patient in no acute distress. Respiratory: Respirations slightly labored and noisy on supplemental O2 Neurological: Patient is alert and will track objects around the room.  He is nonverbal and does not follow commands pupils equal round and reactive to light, blinks to threat on the left but inconsistently on the right, left facial droop present he is able to move all 4 extremities with good antigravity strength spontaneously and purposefully  ASSESSMENT/PLAN Mr. OSUALDO TAVIS is a 82 y.o. male with history of traumatic subdural hematoma status postcraniotomy in 2016, DVT, hypertension, hyperlipidemia  and diabetes with retinopathy and neuropathy who originally presented after a fall from standing height in which he struck his head.  After the fall, he was initially alert and oriented but became poorly responsive with posturing and was intubated upon arrival to the ED for airway protection.  His head CT then demonstrated chronic subdural hematoma.  EEG showed no seizure activity, although poor mental status persisted.  MRI brain demonstrated acute ischemic infarcts in the right midbrain and right cerebellum.  Patient has been extubated today and is doing well, although he is still nonverbal and unable to follow commands.  Stroke:  right midbrain and cerebellar infarcts, etiology: Embolic versus small vessel disease MRI 3/5: Small acute infarcts in right midbrain and right cerebellum MRA/MRV: Pending Carotid Doppler unremarkable 2D Echo EF 60 to 65%, mild to moderately dilated left atrium EEG suggestive of moderate to severe diffuse encephalopathy with no seizures or epileptiform discharges seen LDL 79 HgbA1c 8.5 (01/2022) VTE prophylaxis - lovenox No antithrombotic prior to admission, now on No antithrombotic.  Hold off on aspirin for now due to enlargement in size of IPH Therapy recommendations: Pending Disposition: Pending  Chronic SDH b/l L acute on chronic SDH, posttraumatic Small left temporal ICH, ? traumatic Traumatic SDH in 2016 s/p crani CT head 2/29 subdural hematoma overlying left cerebral hemisphere measuring up to 13 mm, predominantly hypodense and chronic with small volume acute hemorrhage within this collection, small volume acute subdural hemorrhage along left tentorium, chronic subdural hematomas overlying right cerebral hemisphere and right aspect of the falx, left parietal scalp hematoma and laceration CT head 3/1: Similar size of bilateral subdural collections, no other acute abnormality MRI 3/5 : recent IPH in left posterior temporal lobe with surrounding edema, poorly seen  on prior CT similar appearance of bilateral cerebral convexity subdural fluid collections CT head 3/6: No significant interval change in size of right extra-axial fluid collection, stable to minimal interval increase in size of left cerebral convexity extra-axial fluid collection, unchanged infarct in right cerebellum,  increased size of parenchymal hematoma in left posterior temporal lobe Neurosurgery on board Hold off ASA at this time.  Repeat CT Friday, or sooner if neuro changes  Seizure like activity poorly responsive and posturing on arrival to the ED  cEEG suggestive of moderate to severe diffuse encephalopathy with no seizures or epileptiform discharges seen on Keppra 500 mg twice daily for seizure prophylaxis  Hypertension Home meds: Amlodipine 5 mg daily, losartan 100 mg daily, carvedilol 25 mg daily Stable on the high end Keep systolic blood pressure less than 160 On amlodipine 10 and coreg 12.5 now Hold off losartan given elevated Cre Long-term BP goal normotensive  Hyperlipidemia Home meds: Simvastatin 40 mg daily LDL 79, goal < 70 Hold off statin for now given ICH Continue statin  at discharge  Diabetes type II Uncontrolled Home meds: Glipizide 2.5 mg twice daily, metformin 500 mg twice daily HgbA1c 8.5 in 01/2022, goal < 7.0 CBGs SSI Recommend close PCP follow-up for better diabetes control  Dysphagia  NPO Cortrak placed On TF @ 31  Other Stroke Risk Factors Advanced Age >/= 8  Former cigarette smoker DVT  Other Active Problems Delirium / sundowning - continue delirium precautions CKD IIIb, Cre 2.57->2.74->2.52  Hospital day # Comfort , MSN, AGACNP-BC [facility] See Amion for schedule and pager information 03/15/2022 3:32 PM   ATTENDING NOTE: I reviewed above note and agree with the assessment and plan. Pt was seen and examined.   RN at the bedside. Pt lying in bed, extubated this am, so far tolerating well but still  has intermittent coughing with copious secretions. Pt eyes open, awake nonverbal, not answer questions or following commands. Tracking to voice bilaterally but incomplete gaze bilateraly, not blinking to visual threat on the right, but blinking to visual threat on the left, PERRL. Left facial droop. Tongue protrusion not cooperative. Not cooperative on strength testing but spontaneous moving all extremities seems symmetrical by observation. Sensation, coordination and gait not tested.   Pt admitted for small acute on chronic left SDH, stable and much improved. Still has b/l chronic SDH and hygroma. MRI also found to have left temporal ICH likely traumatic too, but repeat CT showed enlarged size, will need strict BP goal < 160, resuming his po BP meds after cortrak placed. Stroke at right cerebellar and right midbrain, likely the cause of fall instead of developed after SDH. However, pt not antiplatelet candidate, hold off ASA for now. Continue keppra for seizure prevention. Pending MRA and MRV once able. PT/OT/speech pending.   For detailed assessment and plan, please refer to above/below as I have made changes wherever appropriate.   Rosalin Hawking, MD PhD Stroke Neurology 03/15/2022 8:48 PM  This patient is critically ill due to acute on chronic subdural hematoma, temporal ICH, stroke, CKD, seizure-like activity and at significant risk of neurological worsening, death form brain compression, status epilepticus, recurrent ICH, recurrent stroke, delirium. This patient's care requires constant monitoring of vital signs, hemodynamics, respiratory and cardiac monitoring, review of multiple databases, neurological assessment, discussion with family, other specialists and medical decision making of high complexity. I spent 40 minutes of neurocritical care time in the care of this patient.    To contact Stroke Continuity provider, please refer to [email or web address] After hours, contact General Neurology

## 2022-03-15 NOTE — Procedures (Signed)
Cortrak  Person Inserting Tube:  Alroy Dust, Glendal Cassaday L, RD Tube Type:  Cortrak - 43 inches Tube Size:  10 Tube Location:  Right nare Secured by: Clip Technique Used to Measure Tube Placement:  Marking at nare/corner of mouth Cortrak Secured At:  67 cm   Cortrak Tube Team Note:  Consult received to place a Cortrak feeding tube.   X-ray is required, abdominal x-ray has been ordered by the Cortrak team. Please confirm tube placement before using the Cortrak tube.   If the tube becomes dislodged please keep the tube and contact the Cortrak team at [email or web address] for replacement.  If after hours and replacement cannot be delayed, place a NG tube and confirm placement with an abdominal x-ray.    Hermina Barters RD, LDN Clinical Dietitian See Shea Evans for contact information.

## 2022-03-16 ENCOUNTER — Inpatient Hospital Stay (HOSPITAL_COMMUNITY): Payer: Medicare Other

## 2022-03-16 DIAGNOSIS — S065XAA Traumatic subdural hemorrhage with loss of consciousness status unknown, initial encounter: Secondary | ICD-10-CM | POA: Diagnosis not present

## 2022-03-16 DIAGNOSIS — I6302 Cerebral infarction due to thrombosis of basilar artery: Secondary | ICD-10-CM | POA: Diagnosis not present

## 2022-03-16 LAB — BASIC METABOLIC PANEL
Anion gap: 7 (ref 5–15)
BUN: 55 mg/dL — ABNORMAL HIGH (ref 8–23)
CO2: 28 mmol/L (ref 22–32)
Calcium: 8.1 mg/dL — ABNORMAL LOW (ref 8.9–10.3)
Chloride: 110 mmol/L (ref 98–111)
Creatinine, Ser: 1.43 mg/dL — ABNORMAL HIGH (ref 0.61–1.24)
GFR, Estimated: 49 mL/min — ABNORMAL LOW (ref >60)
Glucose, Bld: 221 mg/dL — ABNORMAL HIGH (ref 70–99)
Potassium: 4.8 mmol/L (ref 3.5–5.1)
Sodium: 145 mmol/L (ref 135–145)

## 2022-03-16 LAB — CBC
HCT: 27.1 % — ABNORMAL LOW (ref 39.0–52.0)
Hemoglobin: 8.3 g/dL — ABNORMAL LOW (ref 13.0–17.0)
MCH: 30.3 pg (ref 26.0–34.0)
MCHC: 30.6 g/dL (ref 30.0–36.0)
MCV: 98.9 fL (ref 80.0–100.0)
Platelets: 290 10*3/uL (ref 150–400)
RBC: 2.74 MIL/uL — ABNORMAL LOW (ref 4.22–5.81)
RDW: 14.4 % (ref 11.5–15.5)
WBC: 9 10*3/uL (ref 4.0–10.5)
nRBC: 0 % (ref 0.0–0.2)

## 2022-03-16 LAB — GLUCOSE, CAPILLARY
Glucose-Capillary: 289 mg/dL — ABNORMAL HIGH (ref 70–99)
Glucose-Capillary: 227 mg/dL — ABNORMAL HIGH (ref 70–99)
Glucose-Capillary: 187 mg/dL — ABNORMAL HIGH (ref 70–99)
Glucose-Capillary: 238 mg/dL — ABNORMAL HIGH (ref 70–99)
Glucose-Capillary: 248 mg/dL — ABNORMAL HIGH (ref 70–99)
Glucose-Capillary: 169 mg/dL — ABNORMAL HIGH (ref 70–99)

## 2022-03-16 LAB — HEMOGLOBIN A1C
Hgb A1c MFr Bld: 9.2 % — ABNORMAL HIGH (ref 4.8–5.6)
Mean Plasma Glucose: 217 mg/dL

## 2022-03-16 MED ORDER — JEVITY 1.5 CAL/FIBER PO LIQD
1000.0000 mL | ORAL | Status: DC
  Administered 2022-03-16 – 2022-03-19 (×4): 1000 mL
  Filled 2022-03-16 (×6): qty 1000

## 2022-03-16 MED ORDER — FUROSEMIDE 10 MG/ML IJ SOLN
40.0000 mg | Freq: Once | INTRAMUSCULAR | Status: AC
  Administered 2022-03-16: 40 mg via INTRAVENOUS
  Filled 2022-03-16: qty 4

## 2022-03-16 MED ORDER — INSULIN ASPART 100 UNIT/ML IJ SOLN: 5.0000 [IU] | INTRAMUSCULAR | Status: DC

## 2022-03-16 MED ORDER — FREE WATER
100.0000 mL | Status: DC
  Administered 2022-03-16 – 2022-03-19 (×16): 100 mL

## 2022-03-16 MED ORDER — INSULIN ASPART 100 UNIT/ML IJ SOLN
6.0000 [IU] | INTRAMUSCULAR | Status: DC
  Administered 2022-03-16 – 2022-03-18 (×11): 6 [IU] via SUBCUTANEOUS

## 2022-03-16 NOTE — Progress Notes (Signed)
Nutrition Follow-up  DOCUMENTATION CODES:   Not applicable  INTERVENTION:    Tube feeding via cortrak tube: Change to Jevity 1.5 at 55 ml/h (1320 ml per day) Prosource TF20 60 ml 1x/d Provides 2060 kcal, 104 gm protein, 1006 ml free water daily  Recommend 100 ml free water flush every 4 hours  Total free water: 1606 ml - ok with trauma will add    NUTRITION DIAGNOSIS:   Inadequate oral intake related to inability to eat as evidenced by NPO status. Ongoing.   GOAL:   Patient will meet greater than or equal to 90% of their needs Met with TF at goal rate   MONITOR:   TF tolerance, Skin, I & O's, Vent status, Labs  REASON FOR ASSESSMENT:   Consult Enteral/tube feeding initiation and management  ASSESSMENT:   Pt with hx of HTN, HLD, and DM type 2 presented to ED after a fall from standing height where he struck his head. Intubated on arrival to ED.  Pt discussed during ICU rounds and with RN.  Pt is nonverbal and not following commands.   03/06 - s/p cortrak placement; tip in stomach per xray; unable to place a bridle   Medications reviewed and include: colace, folic acid, SSI, 6 units novolog every 4 hours, protoinx, miralax, thiamine  Received lasix dose 40 mg 3/3, 3/4, 3/5, 3/7  Labs reviewed:  Folate: 5 (LOW) Vitamin B1: 103.8 Vitamin B12: 1016 A1C: 9.2 CBG's: 150-289  + moderate pitting edema to all extremities + very deep pitting edema to perineal area  Diet Order:   Diet Order             Diet NPO time specified  Diet effective now                   EDUCATION NEEDS:   Not appropriate for education at this time  Skin:  Skin Assessment: Skin Integrity Issues: Skin Integrity Issues:: Other (Comment) Other: pressure injury: penis  Last BM:  3/5  Height:   Ht Readings from Last 1 Encounters:  03/09/22 5' 10.98" (1.803 m)    Weight:   Wt Readings from Last 1 Encounters:  03/16/22 95 kg    Ideal Body Weight:  78.2 kg  BMI:   Body mass index is 29.23 kg/m.  Estimated Nutritional Needs:   Kcal:  1900-2100 kcal/d  Protein:  100-115g/d  Fluid:  >/=2L/d  Terance Pomplun P., RD, LDN, CNSC See [facility] for contact information

## 2022-03-16 NOTE — Progress Notes (Signed)
Patient ID: David Mendoza, male   DOB: February 11, 1940, 82 y.o.   MRN: [id number] Results of MRI,MRA and MRV noted. No major sinus occlusion except for ant sagital sinus. No major arterial abnormality. Left posterior temporal ich noted, midbrain cva''s noted. No surgical intervention recommended or planned. Follow conservatively.

## 2022-03-16 NOTE — Progress Notes (Addendum)
STROKE TEAM PROGRESS NOTE   INTERVAL HISTORY Patient is seen in his room with no family members at the bedside.  He has been hemodynamically stable overnight and his neurological exam is stable.  He was reportedly restless all night and is more drowsy today.  Will observe today and possibly start Seroquel tonight if he appears to be getting agitated at that time.  Will attempt MRI/MRV today as he is more stable.  Vitals:   03/16/22 0715 03/16/22 0730 03/16/22 0800 03/16/22 0805  BP: (!) 159/66 (!) 164/66 (!) 148/60   Pulse: 78 79 68   Resp: (!) 22 (!) 21 (!) 37   Temp:    97.8 F (36.6 C)  TempSrc:    Axillary  SpO2: 98% 98% 98%   Weight: 95 kg     Height:       CBC:  Recent Labs  Lab 03/15/22 0505 03/16/22 0554  WBC 6.6 9.0  HGB 7.2* 8.3*  HCT 23.5* 27.1*  MCV 99.2 98.9  PLT 254 [id number]    Basic Metabolic Panel:  Recent Labs  Lab 03/11/22 1852 03/12/22 0443 03/13/22 0435 03/15/22 0505 03/16/22 0554  NA  --  140   < > 146* 145  K  --  4.9   < > 4.8 4.8  CL  --  114*   < > 109 110  CO2  --  21*   < > 30 28  GLUCOSE  --  100*   < > 80 221*  BUN  --  39*   < > 52* 55*  CREATININE  --  2.71*   < > 2.52* 1.43*  CALCIUM  --  7.0*   < > 8.0* 8.1*  MG 1.8 1.9  --   --   --   PHOS 3.0 3.2  --   --   --    < > = values in this interval not displayed.    Lipid Panel:  Recent Labs  Lab 03/15/22 0505  CHOL 120  TRIG 39  HDL 33*  CHOLHDL 3.6  VLDL 8  LDLCALC 79    HgbA1c:  Recent Labs  Lab 03/15/22 0505  HGBA1C 9.2*   Urine Drug Screen: No results for input(s): "LABOPIA", "COCAINSCRNUR", "LABBENZ", "AMPHETMU", "THCU", "LABBARB" in the last 168 hours.  Alcohol Level  Recent Labs  Lab 03/09/22 1201  ETH <10     IMAGING past 24 hours VAS US CAROTID  Result Date: 03/15/2022 Carotid Arterial Duplex Study Patient Name:  TOREY SLAUSON Legaspi  Date of Exam:   03/15/2022 Medical Rec #: [id number]       Accession #:    [id number] Date of Birth: 1940/03/18        Patient  Gender: M Patient Age:   36 years Exam Location:  [facility] Procedure:      VAS US CAROTID Referring Phys: Cornelius Moras Maitri Schnoebelen --------------------------------------------------------------------------------  Indications:  CVA. Risk Factors: Hypertension, hyperlipidemia, Diabetes. Limitations   Today's exam was limited due to the patient's respiratory               variation. Performing Technologist: Darlin Coco RDMS, RVT  Examination Guidelines: A complete evaluation includes B-mode imaging, spectral Doppler, color Doppler, and power Doppler as needed of all accessible portions of each vessel. Bilateral testing is considered an integral part of a complete examination. Limited examinations for reoccurring indications may be performed as noted.  Right Carotid Findings: +----------+--------+--------+--------+------------------+------------------+  PSV cm/sEDV cm/sStenosisPlaque DescriptionComments           +----------+--------+--------+--------+------------------+------------------+ CCA Prox  70      19                                                   +----------+--------+--------+--------+------------------+------------------+ CCA Distal85      19                                intimal thickening +----------+--------+--------+--------+------------------+------------------+ ICA Prox  110     22      1-39%   heterogenous                         +----------+--------+--------+--------+------------------+------------------+ ICA Distal69      21                                                   +----------+--------+--------+--------+------------------+------------------+ ECA       142     14                                                   +----------+--------+--------+--------+------------------+------------------+ +----------+--------+-------+----------------+-------------------+           PSV cm/sEDV cmsDescribe        Arm Pressure (mmHG)  +----------+--------+-------+----------------+-------------------+ [id number]             Multiphasic, WNL                    +----------+--------+-------+----------------+-------------------+ +---------+--------+--+--------+--+---------+ VertebralPSV cm/s53EDV cm/s14Antegrade +---------+--------+--+--------+--+---------+  Left Carotid Findings: +----------+--------+--------+--------+------------------+--------+           PSV cm/sEDV cm/sStenosisPlaque DescriptionComments +----------+--------+--------+--------+------------------+--------+ CCA Prox  89      15                                         +----------+--------+--------+--------+------------------+--------+ CCA Distal86      16                                         +----------+--------+--------+--------+------------------+--------+ ICA Prox  124     34              heterogenous               +----------+--------+--------+--------+------------------+--------+ ICA Distal83      16                                         +----------+--------+--------+--------+------------------+--------+ ECA       110     12                                         +----------+--------+--------+--------+------------------+--------+ +----------+--------+--------+----------------+-------------------+  PSV cm/sEDV cm/sDescribe        Arm Pressure (mmHG) +----------+--------+--------+----------------+-------------------+ [id number]              Multiphasic, WNL                    +----------+--------+--------+----------------+-------------------+ +---------+--------+--+--------+--+---------+ VertebralPSV cm/s48EDV cm/s13Antegrade +---------+--------+--+--------+--+---------+   Summary: Right Carotid: Velocities in the right ICA are consistent with a 1-39% stenosis. Left Carotid: Velocities in the left ICA are consistent with a 1-39% stenosis. Vertebrals:  Bilateral vertebral arteries demonstrate  antegrade flow. Subclavians: Normal flow hemodynamics were seen in bilateral subclavian              arteries. *See table(s) above for measurements and observations.     Preliminary    CT HEAD WO CONTRAST (5MM)  Result Date: 03/15/2022 CLINICAL DATA:  Stroke follow-up EXAM: CT HEAD WITHOUT CONTRAST TECHNIQUE: Contiguous axial images were obtained from the base of the skull through the vertex without intravenous contrast. RADIATION DOSE REDUCTION: This exam was performed according to the departmental dose-optimization program which includes automated exposure control, adjustment of the mA and/or kV according to patient size and/or use of iterative reconstruction technique. COMPARISON:  CT Head 03/10/22, MRI Head 03/14/22 FINDINGS: Brain: There is no significant interval change in size of the right cerebral convexity extra-axial fluid collection. There is minimal interval increase in size of the left cerebral convexity fluid collection. There is no evidence of midline shift. No significant mass effect. Redemonstrated there is an acute infarct in the right cerebellum, as seen on recent prior brain MRI. There is also redemonstration of a parenchymal hematoma in the left posterior temporal lobe with surrounding edema. This has increased in size when compared to prior CT brain dated 03/10/2022. Size and shape of the ventricular system is unchanged from prior exam. Unchanged small volume subdural blood products along the left tentorial leaflet and the posterior falx. Vascular: No hyperdense vessel or unexpected calcification. Skull: Burr hole craniotomies in the bilateral frontal calvarium skin staples over the left parietal scalp. Sinuses/Orbits: Trace bilateral mastoid effusions, right-greater-than-left. Pansinus mucosal thickening. Bilateral lens replacement. Orbits are otherwise unremarkable. Other: None. IMPRESSION: 1. No significant interval change in size of the right cerebral convexity extra-axial fluid collection.  Stable to minimal interval increase in size of the left cerebral convexity extra-axial fluid collection. No significant mass effect or midline shift. 2. Unchanged acute infarct in the right cerebellum. 3. Unchanged small volume subdural blood products along the left tentorial leaflet and posterior falx. 4. Increased size of parenchymal hematoma in the left posterior temporal lobe when compared to prior CT brain. The hematoma was poorly visualized on prior imaging and would have been difficult to differentiate from artifact prospectively. Electronically Signed   By: Marin Roberts M.D.   On: 03/15/2022 14:03   DG Abd Portable 1V  Result Date: 03/15/2022 CLINICAL DATA:  Feeding tube placement EXAM: PORTABLE ABDOMEN - 1 VIEW COMPARISON:  KUB 03/10/2022 FINDINGS: The enteric catheter tip is in the stomach. There is nonobstructive bowel gas pattern. There is no abnormal soft tissue calcification. There is no definite free intraperitoneal air. There is a probable small right pleural effusion, incompletely imaged. The left lung base is clear. IMPRESSION: Enteric catheter tip in the stomach. Electronically Signed   By: Valetta Mole M.D.   On: 03/15/2022 12:30    PHYSICAL EXAM General: Alert, chronically ill-appearing elderly patient in no acute distress. Respiratory: Respirations Giller and unlabored on supplemental O2 Neurological: Patient is drowsy but  will track objects around the room.  Extraocular movements intact. He is nonverbal and does not follow commands. pupils equal round and reactive to light, left facial droop present. He moves bilateral upper extremities nonpurposeful and will withdraw lower bilateral lower extremities to noxious  ASSESSMENT/PLAN Mr. SABREE ZAVALA is a 82 y.o. male with history of traumatic subdural hematoma status postcraniotomy in 2016, DVT, hypertension, hyperlipidemia and diabetes with retinopathy and neuropathy who originally presented after a fall from standing height in  which he struck his head.  After the fall, he was initially alert and oriented but became poorly responsive with posturing and was intubated upon arrival to the ED for airway protection.  His head CT then demonstrated chronic subdural hematoma.  EEG showed no seizure activity, although poor mental status persisted.  MRI brain demonstrated acute ischemic infarcts in the right midbrain and right cerebellum.  Patient has been extubated today and is doing well, although he is still nonverbal and unable to follow commands.  Stroke:  right midbrain and cerebellar infarcts, etiology: Embolic versus small vessel disease MRI 3/5: Small acute infarcts in right midbrain and right cerebellum MRA no LOV or significant intracranial stenosis, except Left ACA A2 stenosis versus azygous ACA anatomy  MRV - diminutive and/or chronically occluded anterior-most segment of the superior sagittal sinus, doubtful significance CT repeat in am Carotid Doppler unremarkable 2D Echo EF 60 to 65%, mild to moderately dilated left atrium EEG suggestive of moderate to severe diffuse encephalopathy with no seizures or epileptiform discharges seen LDL 79 HgbA1c 8.5 (01/2022) VTE prophylaxis - lovenox No antithrombotic prior to admission, now on No antithrombotic.  ASA decision based on CT repeat in am Therapy recommendations: Pending Disposition: Pending  Chronic SDH b/l L acute on chronic SDH, posttraumatic Small left temporal ICH, ? traumatic Traumatic SDH in 2016 s/p crani CT head 2/29 subdural hematoma overlying left cerebral hemisphere measuring up to 13 mm, predominantly hypodense and chronic with small volume acute hemorrhage within this collection, small volume acute subdural hemorrhage along left tentorium, chronic subdural hematomas overlying right cerebral hemisphere and right aspect of the falx, left parietal scalp hematoma and laceration CT head 3/1: Similar size of bilateral subdural collections, no other acute  abnormality MRI 3/5 : recent IPH in left posterior temporal lobe with surrounding edema, poorly seen on prior CT similar appearance of bilateral cerebral convexity subdural fluid collections CT head 3/6: No significant interval change in size of right extra-axial fluid collection, stable to minimal interval increase in size of left cerebral convexity extra-axial fluid collection, unchanged infarct in right cerebellum,  increased size of parenchymal hematoma in left posterior temporal lobe Neurosurgery on board Hold off ASA at this time.  Repeat CT tomorrow, ASA decision after  Seizure like activity poorly responsive and posturing on arrival to the ED  cEEG suggestive of moderate to severe diffuse encephalopathy with no seizures or epileptiform discharges seen on Keppra 500 mg twice daily for seizure prophylaxis  Hypertension Home meds: Amlodipine 5 mg daily, losartan 100 mg daily, carvedilol 25 mg daily Stable on the high end BP goal less than 160 On amlodipine 10 and coreg 12.5 now Hold off losartan given elevated Cre Long-term BP goal normotensive  Hyperlipidemia Home meds: Simvastatin 40 mg daily LDL 79, goal < 70 Hold off statin for now given ICH Continue statin at discharge  Diabetes type II Uncontrolled Home meds: Glipizide 2.5 mg twice daily, metformin 500 mg twice daily HgbA1c 8.5 in 01/2022, goal < 7.0 CBGs  SSI Recommend close PCP follow-up for better diabetes control  Dysphagia  NPO Cortrak placed On TF @ 47  Other Stroke Risk Factors Advanced Age >/= 67  Former cigarette smoker DVT  Other Active Problems Delirium / sundowning - continue delirium precautions CKD IIIb, Cre 2.57->2.74->2.52->1.43  Hospital day # [place] , MSN, AGACNP-BC [facility] See Amion for schedule and pager information 03/16/2022 10:21 AM   ATTENDING NOTE: I reviewed above note and agree with the assessment and plan. Pt was seen and examined.   RN is at  the bedside. Pt lying in bed, not open eyes on voice. However, with forced eye opening, he was able to track bilaterally to the voice. Not following commands. Not blinking to visual threat, PERRL. Not moving extremities spontaneously but mild withdraw to pain b/l lower extremities.   Pt seems not as responsive as yesterday but RN stated that he was restless all night long. He received multiple dose of IV BP meds overnight for BP > 160. Increased coreg dose and added amlodipine. Continue TF and keppra. Will repeat CT in am to decide on ASA. May consider low dose seroquel if sundowning.   For detailed assessment and plan, please refer to above/below as I have made changes wherever appropriate.   Rosalin Hawking, MD PhD Stroke Neurology 03/16/2022 2:14 PM  This patient is critically ill due to acute on chronic subdural hematoma, temporal ICH, stroke, CKD, seizure-like activity and at significant risk of neurological worsening, death form brain compression, status epilepticus, recurrent ICH, recurrent stroke, delirium. This patient's care requires constant monitoring of vital signs, hemodynamics, respiratory and cardiac monitoring, review of multiple databases, neurological assessment, discussion with family, other specialists and medical decision making of high complexity. I spent 40 minutes of neurocritical care time in the care of this patient.   To contact Stroke Continuity provider, please refer to [email or web address] After hours, contact General Neurology

## 2022-03-16 NOTE — Progress Notes (Signed)
OT Cancellation Note  Patient Details Name: David Mendoza MRN: [id number] DOB: 1940/10/20   Cancelled Treatment:    Reason Eval/Treat Not Completed: Patient's level of consciousness (Pt not alert enough to participate in OT evaluation this date. Will re-attempt OT eval on 03/16/22)  Ailene Ravel, OTR/L,CBIS  Supplemental OT - [place] and [facility] Secure Chat Preferred   03/16/2022, 3:16 PM

## 2022-03-16 NOTE — Progress Notes (Signed)
SLP Cancellation Note  Patient Details Name: David Mendoza MRN: [id number] DOB: 07-21-1940   Cancelled treatment:       Reason Eval/Treat Not Completed: Fatigue/lethargy limiting ability to participate. Pt not alert enough to attempt swallow eval per RN, who shares that he was up a lot of the night. She recommends holding for another day. SLP will f/u as able.    Osie Bond., M.A. [place] Office [phone]  Secure chat preferred  03/16/2022, 9:13 AM

## 2022-03-16 NOTE — Progress Notes (Addendum)
Patient ID: DURK CROPSEY, male   DOB: 1940-11-22, 82 y.o.   MRN: [id number] Follow up - Trauma Critical Care   Patient Details:    David Mendoza is an 82 y.o. male.  Lines/tubes : PICC Double Lumen 0000000 Right Basilic 40 cm 0 cm (Active)  Indication for Insertion or Continuance of Line Poor Vasculature-patient has had multiple peripheral attempts or PIVs lasting less than 24 hours 03/16/22 0800  Exposed Catheter (cm) 0 cm 03/15/22 0854  Site Assessment Clean, Dry, Intact 03/16/22 0800  Lumen #1 Status Flushed;Saline locked 03/16/22 0800  Lumen #2 Status Flushed;Saline locked 03/16/22 0800  Dressing Type Transparent;Securing device 03/16/22 0800  Dressing Status Antimicrobial disc in place 03/16/22 0800  Safety Lock Not Applicable 0000000 0000000  Line Care Connections checked and tightened 03/16/22 0800  Line Adjustment (NICU/IV Team Only) No 03/15/22 0854  Dressing Intervention New dressing;Other (Comment) 03/15/22 0854  Dressing Change Due 03/22/22 03/16/22 0800     Urethral Catheter Amy Dorothyann Peng Straight-tip 16 Fr. (Active)  Indication for Insertion or Continuance of Catheter Therapy based on hourly urine output monitoring and documentation for critical condition (NOT STRICT I&O) 03/16/22 0800  Site Assessment Clean, Dry, Intact 03/16/22 0800  Catheter Maintenance Bag below level of bladder 03/16/22 0800  Collection Container Standard drainage bag 03/16/22 0800  Securement Method Securing device (Describe) 03/16/22 0800  Urinary Catheter Interventions (if applicable) Unclamped [id number]  Input (mL) 0 mL 03/11/22 1600  Output (mL) 85 mL 03/16/22 0600    Microbiology/Sepsis markers: Results for orders placed or performed during the hospital encounter of 03/09/22  MRSA Next Gen by PCR, Nasal     Status: None   Collection Time: 03/09/22  5:00 PM   Specimen: Nasal Mucosa; Nasal Swab  Result Value Ref Range Status   MRSA by PCR Next Gen NOT DETECTED NOT DETECTED Final     Comment: (NOTE) The GeneXpert MRSA Assay (FDA approved for NASAL specimens only), is one component of a comprehensive MRSA colonization surveillance program. It is not intended to diagnose MRSA infection nor to guide or monitor treatment for MRSA infections. Test performance is not FDA approved in patients less than 70 years old. Performed at [facility], [place] [address] [place], [place] [address]     Anti-infectives:  Anti-infectives (From admission, onward)    Start     Dose/Rate Route Frequency Ordered Stop   03/09/22 1400  ceFAZolin (ANCEF) IVPB 2g/100 mL premix        2 g 200 mL/hr over 30 Minutes Intravenous  Once 03/09/22 1346 03/09/22 1600     Consults: Treatment Team:  Md, Trauma, MD Kristeen Miss, MD    Studies:    Events:  Subjective:    Overnight Issues:  stable Objective:  Vital signs for last 24 hours: Temp:  [96.1 F (35.6 C)-97.8 F (36.6 C)] 97.8 F (36.6 C) (03/07 0805) Pulse Rate:  [68-100] 68 (03/07 0800) Resp:  [12-48] 37 (03/07 0800) BP: (137-198)/(56-125) 148/60 (03/07 0800) SpO2:  [93 %-99 %] 98 % (03/07 0800) Weight:  [95 kg] 95 kg (03/07 0715)  Hemodynamic parameters for last 24 hours:    Intake/Output from previous day: 03/06 0701 - 03/07 0700 In: 1327.9 [I.V.:7.9; NG/GT:1320] Out: 1780 [Urine:1780]  Intake/Output this shift: Total I/O In: 55 [NG/GT:55] Out: -   Vent settings for last 24 hours:    Physical Exam:  General: no respiratory distress Neuro: arouses and follows some commands HEENT/Neck: no JVD Resp: some upper  airway sounds CVS: RRR GI: soft, NT Extremities: edema 2+ Penile edema somewhat less  Results for orders placed or performed during the hospital encounter of 03/09/22 (from the past 24 hour(s))  Glucose, capillary     Status: Abnormal   Collection Time: 03/15/22 11:22 AM  Result Value Ref Range   Glucose-Capillary 165 (H) 70 - 99 mg/dL  Glucose, capillary     Status: Abnormal   Collection  Time: 03/15/22  3:31 PM  Result Value Ref Range   Glucose-Capillary 150 (H) 70 - 99 mg/dL  Glucose, capillary     Status: Abnormal   Collection Time: 03/15/22  7:20 PM  Result Value Ref Range   Glucose-Capillary 200 (H) 70 - 99 mg/dL  Glucose, capillary     Status: Abnormal   Collection Time: 03/15/22 11:08 PM  Result Value Ref Range   Glucose-Capillary 270 (H) 70 - 99 mg/dL  Glucose, capillary     Status: Abnormal   Collection Time: 03/16/22  3:38 AM  Result Value Ref Range   Glucose-Capillary 289 (H) 70 - 99 mg/dL  CBC     Status: Abnormal   Collection Time: 03/16/22  5:54 AM  Result Value Ref Range   WBC 9.0 4.0 - 10.5 K/uL   RBC 2.74 (L) 4.22 - 5.81 MIL/uL   Hemoglobin 8.3 (L) 13.0 - 17.0 g/dL   HCT 27.1 (L) 39.0 - 52.0 %   MCV 98.9 80.0 - 100.0 fL   MCH 30.3 26.0 - 34.0 pg   MCHC 30.6 30.0 - 36.0 g/dL   RDW 14.4 11.5 - 15.5 %   Platelets 290 150 - 400 K/uL   nRBC 0.0 0.0 - 0.2 %  Basic metabolic panel     Status: Abnormal   Collection Time: 03/16/22  5:54 AM  Result Value Ref Range   Sodium 145 135 - 145 mmol/L   Potassium 4.8 3.5 - 5.1 mmol/L   Chloride 110 98 - 111 mmol/L   CO2 28 22 - 32 mmol/L   Glucose, Bld 221 (H) 70 - 99 mg/dL   BUN 55 (H) 8 - 23 mg/dL   Creatinine, Ser 1.43 (H) 0.61 - 1.24 mg/dL   Calcium 8.1 (L) 8.9 - 10.3 mg/dL   GFR, Estimated 49 (L) >60 mL/min   Anion gap 7 5 - 15  Glucose, capillary     Status: Abnormal   Collection Time: 03/16/22  7:21 AM  Result Value Ref Range   Glucose-Capillary 227 (H) 70 - 99 mg/dL    Assessment & Plan: Present on Admission:  TBI (traumatic brain injury) ([place])    LOS: 7 days   Additional comments:I reviewed the patient's new clinical lab test results. And CT head Fall Bilateral SDH, falcine hematoma - NSGY Dr. Ellene Route consulted. Suspects mostly chronic small acute component with h/o craniotomy 2016 for b/l SDH. Keppra for seizure prophylaxis. Repeat CT head 3/1 stable. EEG done, MRI 3/6 shows small acute  infarcts R midbrain and R cerebellum, L post temp ICC, B SDH - stroke W/U per Neurology including CT head, MRA MRV Scalp laceration with hematoma - stapled by EDP. Removal 3/14 Acute hypoxic respiratory failure - extubated 3/6, doing OK Pancreatic duct stricture - incidental finding. Will need outpatient follow up HTN - home Coreg CKD stage 4B - CRT has improved H/o multiple falls - not on anticoagulation at home CT c spine negative - D/C collar  FEN: TF, lasix x 1 again ID: ancef in ED VTE: SCDs. Lovenox Foley -  Placed for I/O, penis is swollen, diuresing today Dispo: ICU, Neurology W/U Critical Care Total Time*: 35 Minutes  Georganna Skeans, MD, MPH, FACS Trauma & General Surgery Use [email or web address] to contact on call provider  03/16/2022  *Care during the described time interval was provided by me. I have reviewed this patient's available data, including medical history, events of note, physical examination and test results as part of my evaluation.

## 2022-03-16 NOTE — Evaluation (Signed)
Physical Therapy Evaluation Patient Details Name: David Mendoza MRN: [id number] DOB: November 14, 1940 Today's Date: 03/16/2022  History of Present Illness  82 y.o. male who presented 03/09/22 after a fall. Patient stepped off a curb and fell from standing height. GCS 14; extensor posturing; CT chronic bilateral SDH, acute falcine hematoma; intubated; EEG severe encephalopathy; MRI Small acute infarcts in the right midbrain and right cerebellum, left temporal ICH; extubated 3/6; .  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 PTA (per Jan 2024 chart) patient was  modified independent and using a cane. RN encouraged PT evaluation as pt was following commands for her earlier today. Currently pt followed no commands and did not withdraw to pain. Opened eyes x 3 with name called with/without sternal rub. Currently has WNL PROM bil LEs except rt knee flexion only to 80. Do not yet feel PRAFOs are indicated, but will need to watch for heel cord tightness and consider need in the near future. Pt currently requires total assist for all mobility due to decr cognition/level of arousal.  Anticipate patient may benefit from PT to address problems listed below.  Will follow acutely with PT trial to maximize functional mobility independence and safety.          Recommendations for follow up therapy are one component of a multi-disciplinary discharge planning process, led by the attending physician.  Recommendations may be updated based on patient status, additional functional criteria and insurance authorization.  Follow Up Recommendations Skilled nursing-short term rehab (<3 hours/day) Can patient physically be transported by private vehicle: No    Assistance Recommended at Discharge Frequent or constant Supervision/Assistance  Patient can return home with the  following  Other (comment) (pt cannot be cared for at home at current level)    Equipment Recommendations None recommended by PT (TBD)  Recommendations for Other Services       Functional Status Assessment Patient has had a recent decline in their functional status and demonstrates the ability to make significant improvements in function in a reasonable and predictable amount of time.     Precautions / Restrictions Precautions Precautions: Fall Restrictions Weight Bearing Restrictions: No      Mobility  Bed Mobility               General bed mobility comments: HOB elevated into semi-chair position with brief incr arousal (opened eyes 1/2 way x 3 reps throughout session);    Transfers                   General transfer comment: unable    Ambulation/Gait               General Gait Details: unable  Stairs            Wheelchair Mobility    Modified Rankin (Stroke Patients Only)       Balance                                             Pertinent Vitals/Pain Pain Assessment Pain Assessment: PAINAD Breathing: normal Negative Vocalization: none Facial Expression: smiling or inexpressive Body Language: relaxed Consolability: no need to console PAINAD Score: 0    Home Living Family/patient expects to be discharged to:: Private residence  Living Arrangements: Spouse/significant other Available Help at Discharge: Family;Available PRN/intermittently Type of Home: House Home Access: Stairs to enter Entrance Stairs-Rails: None Entrance Stairs-Number of Steps: 2   Home Layout: Able to live on main level with bedroom/bathroom Home Equipment: Cane - single point;Grab bars - tub/shower Additional Comments: all information from Jan 2024 chart as pt unable to state    Prior Function Prior Level of Function : Independent/Modified Independent             Mobility Comments: using cane (per Jan 2024 chart)       Hand  Dominance        Extremity/Trunk Assessment   Upper Extremity Assessment Upper Extremity Assessment:  (bil UE edema; PROM performed to try to awaken pt/elicit following commands--none followed)    Lower Extremity Assessment Lower Extremity Assessment: RLE deficits/detail;LLE deficits/detail RLE Deficits / Details: PROM hip flex 90, knee 0-80, ankle to neutral; no AROM, did not withdraw to pain but did earlier today LLE Deficits / Details: PROM hip flex 90, knee 0-100, ankle to neutral; no AROM, did not withdraw to pain but did earlier today    Cervical / Trunk Assessment Cervical / Trunk Assessment: Other exceptions Cervical / Trunk Exceptions: leaning to rt in bed; assisted to midline and pillow support provided  Communication   Communication: Other (comment) (difficult to assess due to lethargy)  Cognition Arousal/Alertness: Lethargic   Overall Cognitive Status: Difficult to assess                                          General Comments General comments (skin integrity, edema, etc.): Bil UE elevated on pillows to assist with edema management    Exercises     Assessment/Plan    PT Assessment Patient needs continued PT services  PT Problem List Decreased strength;Decreased range of motion;Decreased balance;Decreased mobility;Decreased cognition       PT Treatment Interventions DME instruction;Functional mobility training;Therapeutic activities;Gait training;Therapeutic exercise;Balance training;Neuromuscular re-education;Cognitive remediation;Patient/family education    PT Goals (Current goals can be found in the Care Plan section)  Acute Rehab PT Goals Patient Stated Goal: unable to state PT Goal Formulation: Patient unable to participate in goal setting Time For Goal Achievement: 03/30/22 Potential to Achieve Goals: Fair    Frequency Min 3X/week     Co-evaluation               AM-PAC PT "6 Clicks" Mobility  Outcome Measure Help needed  turning from your back to your side while in a flat bed without using bedrails?: Total Help needed moving from lying on your back to sitting on the side of a flat bed without using bedrails?: Total Help needed moving to and from a bed to a chair (including a wheelchair)?: Total Help needed standing up from a chair using your arms (e.g., wheelchair or bedside chair)?: Total Help needed to walk in hospital room?: Total Help needed climbing 3-5 steps with a railing? : Total 6 Click Score: 6    End of Session   Activity Tolerance: Patient limited by lethargy Patient left: in bed;with call bell/phone within reach;with restraints reapplied;with SCD's reapplied Nurse Communication: Other (comment) (very lethargic and followed no commands) PT Visit Diagnosis: Other symptoms and signs involving the nervous system [id number])    Time: [id number] PT Time Calculation (min) (ACUTE ONLY): 19 min   Charges:   PT Evaluation $PT Eval Low  Complexity: [place]  Office [phone]   Rexanne Mano 03/16/2022, 3:43 PM

## 2022-03-16 NOTE — Inpatient Diabetes Management (Signed)
Inpatient Diabetes Program Recommendations  AACE/ADA: New Consensus Statement on Inpatient Glycemic Control (2015)  Target Ranges:  Prepandial:   less than 140 mg/dL      Peak postprandial:   less than 180 mg/dL (1-2 hours)      Critically ill patients:  140 - 180 mg/dL    Latest Reference Range & Units 03/14/22 23:30 03/15/22 03:09 03/15/22 08:12 03/15/22 11:22 03/15/22 15:31 03/15/22 19:20  Glucose-Capillary 70 - 99 mg/dL 171 (H)  6 units Novolog 104 (H)  3 units Novolog  117 (H)  3 units Novolog 165 (H)  6 units Novolog 150 (H)  5 units Novolog 200 (H)  6 units Novolog  (H): Data is abnormally high  Latest Reference Range & Units 03/15/22 23:08 03/16/22 03:38 03/16/22 07:21  Glucose-Capillary 70 - 99 mg/dL 270 (H)  11 units Novolog 289 (H)  11 units Novolog 227 (H)  8 units Novolog  (H): Data is abnormally high    Home DM Meds: Glipizide 2.5 mg BID                              Metformin 500 mg  BID     Current Orders: Novolog Moderate Correction Scale/ SSI (0-15 units) Q4 hours      Novolog 3 units Q4 hours    MD- Note pt getting tube feeds at 55cc/hr.  CBGs >200 since Midnight.  If tube feeds will be continued, please consider:  Increase Novolog Tube Feed Coverage to 6 units Q4 hours HOLD if tube feeds HELD for any reason    --Will follow patient during hospitalization--  Wyn Quaker RN, MSN, [place] Diabetes Coordinator Inpatient Glycemic Control Team Team Pager: [phone] (8a-5p)

## 2022-03-17 ENCOUNTER — Inpatient Hospital Stay (HOSPITAL_COMMUNITY): Payer: Medicare Other

## 2022-03-17 DIAGNOSIS — N179 Acute kidney failure, unspecified: Secondary | ICD-10-CM

## 2022-03-17 DIAGNOSIS — S065XAA Traumatic subdural hemorrhage with loss of consciousness status unknown, initial encounter: Principal | ICD-10-CM

## 2022-03-17 LAB — CBC
HCT: 25.6 % — ABNORMAL LOW (ref 39.0–52.0)
Hemoglobin: 8.2 g/dL — ABNORMAL LOW (ref 13.0–17.0)
MCH: 31.3 pg (ref 26.0–34.0)
MCHC: 32 g/dL (ref 30.0–36.0)
MCV: 97.7 fL (ref 80.0–100.0)
Platelets: 262 10*3/uL (ref 150–400)
RBC: 2.62 MIL/uL — ABNORMAL LOW (ref 4.22–5.81)
RDW: 14.5 % (ref 11.5–15.5)
WBC: 12.3 10*3/uL — ABNORMAL HIGH (ref 4.0–10.5)
nRBC: 0 % (ref 0.0–0.2)

## 2022-03-17 LAB — BASIC METABOLIC PANEL
Anion gap: 5 (ref 5–15)
BUN: 54 mg/dL — ABNORMAL HIGH (ref 8–23)
CO2: 30 mmol/L (ref 22–32)
Calcium: 8.2 mg/dL — ABNORMAL LOW (ref 8.9–10.3)
Chloride: 111 mmol/L (ref 98–111)
Creatinine, Ser: 2.22 mg/dL — ABNORMAL HIGH (ref 0.61–1.24)
GFR, Estimated: 29 mL/min — ABNORMAL LOW (ref >60)
Glucose, Bld: 171 mg/dL — ABNORMAL HIGH (ref 70–99)
Potassium: 5 mmol/L (ref 3.5–5.1)
Sodium: 146 mmol/L — ABNORMAL HIGH (ref 135–145)

## 2022-03-17 LAB — GLUCOSE, CAPILLARY
Glucose-Capillary: 113 mg/dL — ABNORMAL HIGH (ref 70–99)
Glucose-Capillary: 191 mg/dL — ABNORMAL HIGH (ref 70–99)
Glucose-Capillary: 202 mg/dL — ABNORMAL HIGH (ref 70–99)
Glucose-Capillary: 174 mg/dL — ABNORMAL HIGH (ref 70–99)
Glucose-Capillary: 190 mg/dL — ABNORMAL HIGH (ref 70–99)
Glucose-Capillary: 176 mg/dL — ABNORMAL HIGH (ref 70–99)

## 2022-03-17 MED ORDER — SODIUM ZIRCONIUM CYCLOSILICATE 10 G PO PACK
10.0000 g | PACK | Freq: Once | ORAL | Status: AC
  Administered 2022-03-17: 10 g
  Filled 2022-03-17: qty 1

## 2022-03-17 MED ORDER — ASPIRIN 81 MG PO CHEW
81.0000 mg | CHEWABLE_TABLET | Freq: Every day | ORAL | Status: DC
  Administered 2022-03-17 – 2022-03-20 (×4): 81 mg
  Filled 2022-03-17 (×4): qty 1

## 2022-03-17 NOTE — Progress Notes (Signed)
Patient ID: David Mendoza, male   DOB: [birth year], 82 y.o.   MRN: [id number] Vital signs are stable Patient with some eye-opening to deep pain but does not follow commands He is moving 4 extremities His neurologic progress is likely to remain very slow He will likely need skilled nursing placement I have nothing further to add from a neurosurgical perspective I will see him on Monday

## 2022-03-17 NOTE — TOC Progression Note (Signed)
Transition of Care [facility]) - Progression Note    Patient Details  Name: David Mendoza MRN: [id number] Date of Birth: 1940/04/17  [facility] CM/SW Contact  Ella Bodo, RN Phone Number: 03/17/2022, 3:13 PM  Clinical Narrative:    Noted PT/OT recommendations for skilled nursing facility.  Spoke with patient's wife, [name] regarding discharge planning: She states she has been speaking with her family, and they are in agreement with SNF placement.  She would prefer the [facility] area, if possible.  She gives permission for patient's information to be faxed out for SNF bed search.  Will follow-up with bed offers when they are available.  Patient placement may be challenging due to care needs and decreased participation with therapies.    Expected Discharge Plan: [place] Barriers to Discharge: Continued Medical Work up  Expected Discharge Plan and Services   Discharge Planning Services: CM Consult   Living arrangements for the past 2 months: Single Family Home                                       Social Determinants of Health (SDOH) Interventions SDOH Screenings   Food Insecurity: No Food Insecurity (01/15/2022)  Housing: Low Risk  (01/15/2022)  Transportation Needs: No Transportation Needs (01/15/2022)  Utilities: Not At Risk (01/15/2022)  Tobacco Use: Medium Risk (03/09/2022)    Readmission Risk Interventions     No data to display         Reinaldo Raddle, RN, BSN  Trauma/Neuro ICU Case Manager [phone]

## 2022-03-17 NOTE — Evaluation (Signed)
Occupational Therapy Evaluation Patient Details Name: David Mendoza MRN: [id number] DOB: 1940/06/15 Today's Date: 03/17/2022   History of Present Illness 82 y.o. male who presented 03/09/22 after a fall. Patient stepped off a curb and fell from standing height. GCS 14; extensor posturing; CT chronic bilateral SDH, acute falcine hematoma; intubated; EEG severe encephalopathy; MRI Small acute infarcts in the right midbrain and right cerebellum, left temporal ICH; extubated 3/6; .  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 Evaluation was limited dur to pt's level of arousal. Prior functioning level was taken from medical chart. Family unavailable to provide any background information. Pt will benefit from skilled OT to increase their safety and independence with ADL and functional mobility for ADL to facilitate discharge to venue listed below. OT will continue to follow patient acutely.        Recommendations for follow up therapy are one component of a multi-disciplinary discharge planning process, led by the attending physician.  Recommendations may be updated based on patient status, additional functional criteria and insurance authorization.   Follow Up Recommendations  Skilled nursing-short term rehab (<3 hours/day)     Assistance Recommended at Discharge Frequent or constant Supervision/Assistance  Patient can return home with the following Other (comment) (Pt is unable to return home at this level of need required.)    Functional Status Assessment  Patient has had a recent decline in their functional status and demonstrates the ability to make significant improvements in function in a reasonable and predictable amount of time.  Equipment Recommendations  Other (comment) (TBD)        Precautions / Restrictions Precautions Precautions: Fall Precaution Comments: [place], PICC line Restrictions Weight Bearing Restrictions: No      Mobility Bed Mobility   General bed mobility comments: Total Assist for all Bed mobility at this time.    Transfers Overall transfer level:  (NT due to safety concerns.)          ADL either performed or assessed with clinical judgement   ADL       General ADL Comments: Pt requires total assist at bed level for all ADL tasks at this time.     Vision Baseline Vision/History: 0 No visual deficits Patient Visual Report:  (Unknown) Additional Comments: not able to test            Pertinent Vitals/Pain Pain Assessment Pain Assessment: PAINAD Breathing: normal Negative Vocalization: none Facial Expression: smiling or inexpressive Body Language: relaxed Consolability: no need to console PAINAD Score: 0 Facial Expression: Relaxed, neutral Body Movements: Absence of movements Muscle Tension: Relaxed Compliance with ventilator (intubated pts.): N/A Vocalization (extubated pts.): Talking in normal tone or no sound CPOT Total: 0     Hand Dominance  (unknown)   Extremity/Trunk Assessment Upper Extremity Assessment Upper Extremity Assessment: RUE deficits/detail;LUE deficits/detail RUE Deficits / Details: Severe edema noted from shoulder to fingers. Functional P/ROM noted in shoulder (to 90 degrees). Edema prevents functional P/ROM of wrist and hand. RUE Sensation:  (unable to assess) RUE Coordination:  (Unable to assess) LUE Deficits / Details: Severe edema noted from shoulder to fingers. Functional P/ROM noted in shoulder (to 90 degrees). Edema prevents functional P/ROM of wrist and hand. Pt presents with increased tone in shoulder, elbow, wrist, and hand with passive ROM limited in shoulder, elbow, and wrist.  Able to passively extend all fingers straight.   Lower Extremity Assessment Lower Extremity Assessment: Defer to  PT evaluation   Cervical / Trunk Assessment Cervical / Trunk Assessment: Other exceptions (unable to assess)   Communication Communication Communication: Other (comment) (Pt did not communicate due to decreased alertness.)   Cognition Arousal/Alertness: Lethargic Behavior During Therapy:  (N/A) Overall Cognitive Status: Difficult to assess     General Comments: With verbal cues, as therapist called out pt's name, he did arouse slightly and opened eyes half way when greeted one time. Pt did not respond to any painful stimuli.     General Comments  Severe edema noted in BUE from hand to shoulders. BUE elevated on pillows to assist with edema management.            Home Living Family/patient expects to be discharged to:: Private residence Living Arrangements: Spouse/significant other Available Help at Discharge: Family;Available PRN/intermittently Type of Home: House Home Access: Stairs to enter [employer] of Steps: 2 Entrance Stairs-Rails: None Home Layout: Able to live on main level with bedroom/bathroom     Bathroom Shower/Tub:  (unknown)         Home Equipment: Cane - single point;Grab bars - tub/shower   Additional Comments: all information from chart review from Jan 2024 as pt unable to state      Prior Functioning/Environment Prior Level of Function : Independent/Modified Independent   Mobility Comments: using cane (per Jan 2024 chart)          OT Problem List: Decreased strength;Decreased coordination;Decreased range of motion;Decreased cognition;Impaired sensation;Increased edema;Decreased activity tolerance;Decreased safety awareness;Impaired tone;Impaired balance (sitting and/or standing);Decreased knowledge of use of DME or AE;Impaired vision/perception;Decreased knowledge of precautions;Impaired UE functional use      OT Treatment/Interventions: Self-care/ADL training;Therapeutic activities;Therapeutic exercise;Neuromuscular  education;Cognitive remediation/compensation;Splinting;Energy conservation;Visual/perceptual remediation/compensation;Patient/family education;DME and/or AE instruction;Balance training;Manual therapy;Modalities    OT Goals(Current goals can be found in the care plan section) Acute Rehab OT Goals Patient Stated Goal: N/A OT Goal Formulation: Patient unable to participate in goal setting Time For Goal Achievement: 03/31/22 Potential to Achieve Goals: Fair  OT Frequency: Min 2X/week       AM-PAC OT "6 Clicks" Daily Activity     Outcome Measure Help from another person eating meals?: Total Help from another person taking care of personal grooming?: Total Help from another person toileting, which includes using toliet, bedpan, or urinal?: Total Help from another person bathing (including washing, rinsing, drying)?: Total Help from another person to put on and taking off regular upper body clothing?: Total Help from another person to put on and taking off regular lower body clothing?: Total 6 Click Score: 6   End of Session    Activity Tolerance: Patient tolerated treatment well Patient left: in bed  OT Visit Diagnosis: Muscle weakness (generalized) (M62.81);Other symptoms and signs involving cognitive function;Other symptoms and signs involving the nervous system (R29.898)                Time: [id number] OT Time Calculation (min): 15 min Charges:  OT General Charges $OT Visit: 1 Visit OT Evaluation $OT Eval High Complexity: 1 High  [employer], OTR/L,CBIS  Supplemental OT - [facility] and [facility] Secure Chat Preferred    Shalee Paolo, Clarene Duke 03/17/2022, 12:42 PM

## 2022-03-17 NOTE — Progress Notes (Signed)
Patient ID: David Mendoza, male   DOB: [birth year], 82 y.o.   MRN: [id number] Follow up - Trauma Critical Care   Patient Details:    David Mendoza is an 82 y.o. male.  Lines/tubes : PICC Double Lumen 0000000 Right Basilic 40 cm 0 cm (Active)  Indication for Insertion or Continuance of Line Poor Vasculature-patient has had multiple peripheral attempts or PIVs lasting less than 24 hours 03/17/22 0800  Exposed Catheter (cm) 0 cm 03/15/22 0854  Site Assessment Clean, Dry, Intact 03/17/22 0800  Lumen #1 Status Flushed;Saline locked 03/17/22 0800  Lumen #2 Status Saline locked;Flushed 03/17/22 0800  Dressing Type Transparent 03/17/22 0800  Dressing Status Antimicrobial disc in place 03/17/22 0800  Safety Lock Not Applicable 0000000 0000000  Line Care Connections checked and tightened 03/17/22 0800  Line Adjustment (NICU/IV Team Only) No 03/15/22 0854  Dressing Intervention New dressing;Other (Comment) 03/15/22 0854  Dressing Change Due 03/22/22 03/17/22 0800     Urethral Catheter Amy Dorothyann Peng Straight-tip 16 Fr. (Active)  Indication for Insertion or Continuance of Catheter Therapy based on hourly urine output monitoring and documentation for critical condition (NOT STRICT I&O) 03/17/22 0800  Site Assessment Clean, Dry, Intact 03/17/22 0800  Catheter Maintenance Bag below level of bladder;Catheter secured;Drainage bag/tubing not touching floor;No dependent loops;Insertion date on drainage bag;Bag emptied prior to transport;Seal intact 03/17/22 0800  Collection Container Standard drainage bag 03/17/22 0800  Securement Method Securing device (Describe) 03/17/22 0800  Urinary Catheter Interventions (if applicable) Unclamped [id number]  Input (mL) 0 mL 03/11/22 1600  Output (mL) 70 mL 03/17/22 0900    Microbiology/Sepsis markers: Results for orders placed or performed during the hospital encounter of 03/09/22  MRSA Next Gen by PCR, Nasal     Status: None   Collection Time: 03/09/22  5:00 PM    Specimen: Nasal Mucosa; Nasal Swab  Result Value Ref Range Status   MRSA by PCR Next Gen NOT DETECTED NOT DETECTED Final    Comment: (NOTE) The GeneXpert MRSA Assay (FDA approved for NASAL specimens only), is one component of a comprehensive MRSA colonization surveillance program. It is not intended to diagnose MRSA infection nor to guide or monitor treatment for MRSA infections. Test performance is not FDA approved in patients less than 75 years old. Performed at [facility], [place] [address] [place], [place] [address]     Anti-infectives:  Anti-infectives (From admission, onward)    Start     Dose/Rate Route Frequency Ordered Stop   03/09/22 1400  ceFAZolin (ANCEF) IVPB 2g/100 mL premix        2 g 200 mL/hr over 30 Minutes Intravenous  Once 03/09/22 1346 03/09/22 1600       Consults: Treatment Team:  Md, Trauma, MD Kristeen Miss, MD    Studies:    Events:  Subjective:    Overnight Issues:  stable Objective:  Vital signs for last 24 hours: Temp:  [96.4 F (35.8 C)-97.9 F (36.6 C)] 96.4 F (35.8 C) (03/08 0800) Pulse Rate:  [66-75] 68 (03/08 1000) Resp:  [17-32] 20 (03/08 1000) BP: (123-158)/(52-75) 123/56 (03/08 1000) SpO2:  [96 %-99 %] 98 % (03/08 1000) Weight:  [93.3 kg] 93.3 kg (03/08 0500)  Hemodynamic parameters for last 24 hours:    Intake/Output from previous day: 03/07 0701 - 03/08 0700 In: 1155 [NG/GT:1155] Out: 1875 [Urine:1875]  Intake/Output this shift: Total I/O In: 100 [NG/GT:100] Out: 210 [Urine:210]  Vent settings for last 24 hours:    Physical Exam:  General: no respiratory distress Neuro: followed one command, opens eyes to voice HEENT/Neck: cortrak Resp: clear to auscultation bilaterally CVS: RRR GI: soft, NT Extremities: edema 1+  Results for orders placed or performed during the hospital encounter of 03/09/22 (from the past 24 hour(s))  Glucose, capillary     Status: Abnormal   Collection Time: 03/16/22  11:17 AM  Result Value Ref Range   Glucose-Capillary 187 (H) 70 - 99 mg/dL  Glucose, capillary     Status: Abnormal   Collection Time: 03/16/22  3:29 PM  Result Value Ref Range   Glucose-Capillary 238 (H) 70 - 99 mg/dL  Glucose, capillary     Status: Abnormal   Collection Time: 03/16/22  7:15 PM  Result Value Ref Range   Glucose-Capillary 248 (H) 70 - 99 mg/dL  Glucose, capillary     Status: Abnormal   Collection Time: 03/16/22 11:15 PM  Result Value Ref Range   Glucose-Capillary 169 (H) 70 - 99 mg/dL  Glucose, capillary     Status: Abnormal   Collection Time: 03/17/22  3:11 AM  Result Value Ref Range   Glucose-Capillary 113 (H) 70 - 99 mg/dL  CBC     Status: Abnormal   Collection Time: 03/17/22  5:00 AM  Result Value Ref Range   WBC 12.3 (H) 4.0 - 10.5 K/uL   RBC 2.62 (L) 4.22 - 5.81 MIL/uL   Hemoglobin 8.2 (L) 13.0 - 17.0 g/dL   HCT 25.6 (L) 39.0 - 52.0 %   MCV 97.7 80.0 - 100.0 fL   MCH 31.3 26.0 - 34.0 pg   MCHC 32.0 30.0 - 36.0 g/dL   RDW 14.5 11.5 - 15.5 %   Platelets 262 150 - 400 K/uL   nRBC 0.0 0.0 - 0.2 %  Basic metabolic panel     Status: Abnormal   Collection Time: 03/17/22  5:00 AM  Result Value Ref Range   Sodium 146 (H) 135 - 145 mmol/L   Potassium 5.0 3.5 - 5.1 mmol/L   Chloride 111 98 - 111 mmol/L   CO2 30 22 - 32 mmol/L   Glucose, Bld 171 (H) 70 - 99 mg/dL   BUN 54 (H) 8 - 23 mg/dL   Creatinine, Ser 2.22 (H) 0.61 - 1.24 mg/dL   Calcium 8.2 (L) 8.9 - 10.3 mg/dL   GFR, Estimated 29 (L) >60 mL/min   Anion gap 5 5 - 15  Glucose, capillary     Status: Abnormal   Collection Time: 03/17/22  7:52 AM  Result Value Ref Range   Glucose-Capillary 191 (H) 70 - 99 mg/dL    Assessment & Plan: Present on Admission:  TBI (traumatic brain injury) ([place])    LOS: 8 days   Additional comments:I reviewed the patient's new clinical lab test results. / Fall Bilateral SDH, falcine hematoma - NSGY Dr. Ellene Route consulted. Suspects mostly chronic small acute component  with h/o craniotomy 2016 for b/l SDH. Keppra for seizure prophylaxis. Repeat CT head 3/1 stable. EEG done, MRI 3/6 shows small acute infarcts R midbrain and R cerebellum, L post temp ICC, B SDH - stroke W/U per Neurology including CT head, MRA MRV 3/7, ASA Scalp laceration with hematoma - stapled by EDP. Removal 3/14 Acute hypoxic respiratory failure - extubated 3/6, doing OK Pancreatic duct stricture - incidental finding. Will need outpatient follow up HTN - home Coreg CKD stage 4B - CRT has improved H/o multiple falls - not on anticoagulation at home  FEN: TF, lokelma for hyperkalemia ID:  ancef in ED VTE: SCDs. Lovenox Foley - Placed for I/O, penis is swollen Dispo: to 4NP, therapies, anticipate SNF Critical Care Total Time*: 33 Minutes  Georganna Skeans, MD, MPH, FACS Trauma & General Surgery Use [email or web address] to contact on call provider  03/17/2022  *Care during the described time interval was provided by me. I have reviewed this patient's available data, including medical history, events of note, physical examination and test results as part of my evaluation.

## 2022-03-17 NOTE — Progress Notes (Signed)
SLP Cancellation Note  Patient Details Name: MAN MOM MRN: [id number] DOB: 1940/02/08   Cancelled treatment:       Reason Eval/Treat Not Completed: Fatigue/lethargy limiting ability to participate. Pt somnolent this morning per RN, who says she will reach out if he becomes more alert. SLP will f/u as able.      Osie Bond., M.A. [place] Office [phone]  Secure chat preferred  03/17/2022, 7:57 AM

## 2022-03-17 NOTE — Progress Notes (Addendum)
STROKE TEAM PROGRESS NOTE   INTERVAL HISTORY No family at bedside.  Patient lying in bed, open eyes on voice, tracking bilaterally to voice, able to gaze bilaterally, however not follow commands, still has spontaneous movement of extremities, seems right moving more than left.  Repeat CT shows stable hemorrhage, will start aspirin 81 today.  Vitals:   03/17/22 1400 03/17/22 1500 03/17/22 1600 03/17/22 1700  BP: (!) 121/55 (!) 131/55 (!) 125/101 (!) 119/51  Pulse: 71 72 73 67  Resp: 20 (!) 24 (!) 25 (!) 23  Temp:      TempSrc:      SpO2: 95% 97% 96% 96%  Weight:      Height:       CBC:  Recent Labs  Lab 03/16/22 0554 03/17/22 0500  WBC 9.0 12.3*  HGB 8.3* 8.2*  HCT 27.1* 25.6*  MCV 98.9 97.7  PLT 290 99991111   Basic Metabolic Panel:  Recent Labs  Lab 03/11/22 1852 03/12/22 0443 03/13/22 0435 03/16/22 0554 03/17/22 0500  NA  --  140   < > 145 146*  K  --  4.9   < > 4.8 5.0  CL  --  114*   < > 110 111  CO2  --  21*   < > 28 30  GLUCOSE  --  100*   < > 221* 171*  BUN  --  39*   < > 55* 54*  CREATININE  --  2.71*   < > 1.43* 2.22*  CALCIUM  --  7.0*   < > 8.1* 8.2*  MG 1.8 1.9  --   --   --   PHOS 3.0 3.2  --   --   --    < > = values in this interval not displayed.   Lipid Panel:  Recent Labs  Lab 03/15/22 0505  CHOL 120  TRIG 39  HDL 33*  CHOLHDL 3.6  VLDL 8  LDLCALC 79   HgbA1c:  Recent Labs  Lab 03/15/22 0505  HGBA1C 9.2*   Urine Drug Screen: No results for input(s): "LABOPIA", "COCAINSCRNUR", "LABBENZ", "AMPHETMU", "THCU", "LABBARB" in the last 168 hours.  Alcohol Level  No results for input(s): "ETH" in the last 168 hours.  IMAGING past 24 hours CT HEAD WO CONTRAST (5MM)  Result Date: 03/17/2022 CLINICAL DATA:  82 year old male with recent trauma and neurologic deficits. Bilateral subdural hematomas. Small acute infarcts in the right cerebellum, dorsal right midbrain. Left temporal lobe intra-axial hemorrhage. EXAM: CT HEAD WITHOUT CONTRAST  TECHNIQUE: Contiguous axial images were obtained from the base of the skull through the vertex without intravenous contrast. RADIATION DOSE REDUCTION: This exam was performed according to the departmental dose-optimization program which includes automated exposure control, adjustment of the mA and/or kV according to patient size and/or use of iterative reconstruction technique. COMPARISON:  Head CT 03/15/2022 and earlier. FINDINGS: Brain: Bilateral subdural hematomas have decreased in density since 03/10/2022, and size has not significantly changed since that time, about 10 mm thickness bilaterally over the mid and posterior convexities, up to 12-13 mm along both the anterior frontal convexities. Subsequent fairly symmetric mass effect on the cerebral hemispheres. No significant midline shift. Basilar cisterns remain patent. Left posterior and inferior temporal lobe edema with fading hyperdense blood products there. No significant regional mass effect. No new intracranial hemorrhage.  No IVH.  No ventriculomegaly. Recent right cerebellar infarct appears stable on series 6, image 8 with no hemorrhagic transformation or mass effect. Mild hypodensity also evident  at the dorsal right midbrain infarct with no blood or mass effect. No new cortically based infarct identified. Vascular: Calcified atherosclerosis at the skull base. No suspicious intracranial vascular hyperdensity. Skull: Previous burr holes, craniotomy. No acute osseous abnormality identified. Sinuses/Orbits: Stable paranasal sinus mucosal thickening. Other: Stable right nasoenteric tube. Small volume retained secretions in the visible nasopharynx. Postoperative changes to the scalp, including left posterior skin staples probably related to recent trauma. Orbits appear stable and negative. IMPRESSION: 1. Bilateral subdural hematoma size stable since 03/10/2022, with interval decreased density. Symmetric cerebral hemisphere mass effect with no midline shift.  2. Expected evolution of left temporal lobe hemorrhage with mild residual edema and no mass effect. 3. Expected CT appearance of small right cerebellar and midbrain infarcts. No hemorrhagic transformation. 4. No new intracranial abnormality. Electronically Signed   By: David Ann M.D.   On: 03/17/2022 07:15    PHYSICAL EXAM General: Alert, chronically ill-appearing elderly patient in no acute distress. Respiratory: Respirations G[place] and unlabored on supplemental O2 Neurological: Pt eyes halfway open on voice, nonverbal, not answer questions or following commands. Tracking to voice bilaterally able to gaze bilateraly, but not blinking to visual threat bilaterally, PERRL. Left mild facial droop. Tongue protrusion not cooperative. Not cooperative on strength testing but spontaneous moving all extremities seems more on the right than left, however, with pain stimulation, onlyl slight withdraw on BUEs. Sensation, coordination and gait not tested.   ASSESSMENT/PLAN David Mendoza is a 82 y.o. male with history of traumatic subdural hematoma status postcraniotomy in 2016, DVT, hypertension, hyperlipidemia and diabetes with retinopathy and neuropathy who originally presented after a fall from standing height in which he struck his head.  After the fall, he was initially alert and oriented but became poorly responsive with posturing and was intubated upon arrival to the ED for airway protection.  His head CT then demonstrated chronic subdural hematoma.  EEG showed no seizure activity, although poor mental status persisted.  MRI brain demonstrated acute ischemic infarcts in the right midbrain and right cerebellum.  Patient has been extubated today and is doing well, although he is still nonverbal and unable to follow commands.  Stroke:  right midbrain and cerebellar infarcts, etiology: Embolic versus small vessel disease MRI 3/5: Small acute infarcts in right midbrain and right cerebellum MRA no LOV or  significant intracranial stenosis, except Left ACA A2 stenosis versus azygous ACA anatomy  MRV - diminutive and/or chronically occluded anterior-most segment of the superior sagittal sinus, doubtful significance CT repeat 3/8  evolution of left temporal lobe hemorrhage with mild residual edema and no mass effect. Stable b/l SDH and no midline shift Carotid Doppler unremarkable 2D Echo EF 60 to 65%, mild to moderately dilated left atrium EEG suggestive of moderate to severe diffuse encephalopathy with no seizures or epileptiform discharges seen LDL 79 HgbA1c 8.5 (01/2022) VTE prophylaxis - lovenox No antithrombotic prior to admission, will start ASA 81 today as CT repeat stable Therapy recommendations: SNF Disposition: Pending  Chronic SDH b/l L acute on chronic SDH, posttraumatic Small left temporal ICH, ? traumatic Traumatic SDH in 2016 s/p crani CT head 2/29 subdural hematoma overlying left cerebral hemisphere measuring up to 13 mm, predominantly hypodense and chronic with small volume acute hemorrhage within this collection, small volume acute subdural hemorrhage along left tentorium, chronic subdural hematomas overlying right cerebral hemisphere and right aspect of the falx, left parietal scalp hematoma and laceration CT head 3/1: Similar size of bilateral subdural collections, no other acute abnormality  MRI 3/5 : recent IPH in left posterior temporal lobe with surrounding edema, poorly seen on prior CT similar appearance of bilateral cerebral convexity subdural fluid collections CT head 3/6: No significant interval change in size of right extra-axial fluid collection, stable to minimal interval increase in size of left cerebral convexity extra-axial fluid collection, unchanged infarct in right cerebellum,  increased size of parenchymal hematoma in left posterior temporal lobe Neurosurgery on board Hold off ASA at this time.  Repeat CT 3/8 stable evolution of left temporal lobe hemorrhage  with mild residual edema and no mass effect. Stable b/l SDH and no midline shift  Seizure like activity poorly responsive and posturing on arrival to the ED  cEEG suggestive of moderate to severe diffuse encephalopathy with no seizures or epileptiform discharges seen on Keppra 500 mg twice daily for seizure prophylaxis -> now off  Hypertension Home meds: Amlodipine 5 mg daily, losartan 100 mg daily, carvedilol 25 mg daily Stable now BP goal less than 160 On amlodipine 10 and coreg 12.5 now Hold off losartan given elevated Cre Long-term BP goal normotensive  Hyperlipidemia Home meds: Simvastatin 40 mg daily LDL 79, goal < 70 Hold off statin for now given ICH Continue statin at discharge  Diabetes type II Uncontrolled Home meds: Glipizide 2.5 mg twice daily, metformin 500 mg twice daily HgbA1c 8.5 in 01/2022, goal < 7.0 CBGs SSI Recommend close PCP follow-up for better diabetes control  Dysphagia  NPO Cortrak placed On TF @ 39  Other Stroke Risk Factors Advanced Age >/= 8  Former cigarette smoker DVT  Other Active Problems Delirium / sundowning - continue delirium precautions CKD IIIb, Cre 2.57->2.74->2.52->1.43->2.22  Hospital day # 8  Neurology has no further recs at this time, will sign off for now. Please feel free to call with any further questions. Pt will follow up with Dr. Leta Baptist at [facility] in about 4 weeks after discharge. Thanks for the consult.   Rosalin Hawking, MD PhD Stroke Neurology 03/17/2022 6:03 PM   To contact Stroke Continuity provider, please refer to [email or web address] After hours, contact General Neurology

## 2022-03-18 ENCOUNTER — Inpatient Hospital Stay (HOSPITAL_COMMUNITY): Payer: Medicare Other

## 2022-03-18 DIAGNOSIS — Z515 Encounter for palliative care: Secondary | ICD-10-CM

## 2022-03-18 LAB — GLUCOSE, CAPILLARY
Glucose-Capillary: 117 mg/dL — ABNORMAL HIGH (ref 70–99)
Glucose-Capillary: 112 mg/dL — ABNORMAL HIGH (ref 70–99)
Glucose-Capillary: 49 mg/dL — ABNORMAL LOW (ref 70–99)
Glucose-Capillary: 53 mg/dL — ABNORMAL LOW (ref 70–99)
Glucose-Capillary: 93 mg/dL (ref 70–99)
Glucose-Capillary: 214 mg/dL — ABNORMAL HIGH (ref 70–99)
Glucose-Capillary: 181 mg/dL — ABNORMAL HIGH (ref 70–99)
Glucose-Capillary: 159 mg/dL — ABNORMAL HIGH (ref 70–99)

## 2022-03-18 LAB — BASIC METABOLIC PANEL
Anion gap: 7 (ref 5–15)
BUN: 56 mg/dL — ABNORMAL HIGH (ref 8–23)
CO2: 30 mmol/L (ref 22–32)
Calcium: 7.8 mg/dL — ABNORMAL LOW (ref 8.9–10.3)
Chloride: 110 mmol/L (ref 98–111)
Creatinine, Ser: 2.17 mg/dL — ABNORMAL HIGH (ref 0.61–1.24)
GFR, Estimated: 30 mL/min — ABNORMAL LOW (ref >60)
Glucose, Bld: 71 mg/dL (ref 70–99)
Potassium: 4.6 mmol/L (ref 3.5–5.1)
Sodium: 147 mmol/L — ABNORMAL HIGH (ref 135–145)

## 2022-03-18 LAB — CBC
HCT: 25.3 % — ABNORMAL LOW (ref 39.0–52.0)
Hemoglobin: 7.8 g/dL — ABNORMAL LOW (ref 13.0–17.0)
MCH: 30.6 pg (ref 26.0–34.0)
MCHC: 30.8 g/dL (ref 30.0–36.0)
MCV: 99.2 fL (ref 80.0–100.0)
Platelets: 294 10*3/uL (ref 150–400)
RBC: 2.55 MIL/uL — ABNORMAL LOW (ref 4.22–5.81)
RDW: 14.4 % (ref 11.5–15.5)
WBC: 11.2 10*3/uL — ABNORMAL HIGH (ref 4.0–10.5)
nRBC: 0 % (ref 0.0–0.2)

## 2022-03-18 MED ORDER — ALBUTEROL SULFATE (2.5 MG/3ML) 0.083% IN NEBU
2.5000 mg | INHALATION_SOLUTION | Freq: Four times a day (QID) | RESPIRATORY_TRACT | Status: DC | PRN
  Administered 2022-03-18 – 2022-03-19 (×2): 2.5 mg via RESPIRATORY_TRACT
  Filled 2022-03-18: qty 3

## 2022-03-18 MED ORDER — DEXTROSE 50 % IV SOLN: 25.0000 g | INTRAVENOUS | Status: AC

## 2022-03-18 MED ORDER — ALBUTEROL SULFATE (2.5 MG/3ML) 0.083% IN NEBU
INHALATION_SOLUTION | RESPIRATORY_TRACT | Status: AC
  Administered 2022-03-18: 2.5 mg via RESPIRATORY_TRACT
  Filled 2022-03-18: qty 3

## 2022-03-18 MED ORDER — INSULIN ASPART 100 UNIT/ML IJ SOLN
4.0000 [IU] | INTRAMUSCULAR | Status: DC
  Administered 2022-03-18 – 2022-03-20 (×11): 4 [IU] via SUBCUTANEOUS

## 2022-03-18 MED ORDER — ORAL CARE MOUTH RINSE
15.0000 mL | OROMUCOSAL | Status: DC
  Administered 2022-03-18 – 2022-03-20 (×10): 15 mL via OROMUCOSAL

## 2022-03-18 MED ORDER — FUROSEMIDE 10 MG/ML IJ SOLN
40.0000 mg | Freq: Once | INTRAMUSCULAR | Status: AC
  Administered 2022-03-18: 40 mg via INTRAVENOUS
  Filled 2022-03-18: qty 4

## 2022-03-18 MED ORDER — DEXTROSE 50 % IV SOLN
INTRAVENOUS | Status: AC
  Administered 2022-03-18: 25 g via INTRAVENOUS
  Filled 2022-03-18: qty 50

## 2022-03-18 NOTE — Progress Notes (Signed)
SLP Cancellation Note  Patient Details Name: David Mendoza MRN: [id number] DOB: 1940-03-19   Cancelled treatment:       Reason Eval/Treat Not Completed: Medical issues which prohibited therapy. Per RN, pt in respiratory distress and with ongoing decreased level of alertness. GOC to be discussed with family per chart/RN. Will continue f/u.     Ellwood Dense, [place], [place] [facility] Office Number: [phone]  Acie Fredrickson 03/18/2022, 8:38 AM

## 2022-03-18 NOTE — Progress Notes (Addendum)
Patient noted to be dyspneic at change of shift, use of accessory muscles/significant abdominal breathing with audible wheeze. Patient with 3L via [place] in place, RR upper 20's, spo2 95% Dr. Kae Heller at bedside, STAT CXR ordered, Lasix, and neb ordered and administered. Patient NTS with large amount of tan/yellow secretions noted. Some short term relief noted after deep suctioning. MD to speak with family regarding GOC.  Patient also hypoglycemic, 53 via fingerstick and confirmed 49 via PICC draw. Dextrose amp administered per policy.   8:21 AM Repeat blood sugar check 112.

## 2022-03-18 NOTE — Progress Notes (Signed)
Spoke with patient's wife regarding code status. Patient's wife wishes to continue present care, but change code status to DNR. Dr. Kae Heller made aware, DNR order placed. Palliative consult also ordered to help continue goals of care discussions with family.

## 2022-03-18 NOTE — Consult Note (Signed)
Palliative Medicine Inpatient Consult Note  Consulting Provider: Clovis Riley, MD   Reason for consult:  DNR, [place]  03/18/2022  HPI:  Per intake H&P --> David Mendoza is a 82 y.o. male with a h/o  HTN, HLD, DM2, DVT x 2, SDH in 2016 requiring a craniotomy.   Anael was [facility] to [facility] after suffering a fall.  Identified to have bilateral SDH. Additional MRI imaging - found to have small acute infarcts in right midbrain and right cerebellum.  Palliative care has been asked to get involved to support additional goals of care conversations.   Clinical Assessment/Goals of Care:  *Please note that this is a verbal dictation therefore any spelling or grammatical errors are due to the "[place] One" system interpretation.  I have reviewed medical records including EPIC notes, labs and imaging, received report from bedside RN, assessed the patient.    I met with *** to further discuss diagnosis prognosis, GOC, EOL wishes, disposition and options.   I introduced Palliative Medicine as specialized medical care for people living with serious illness. It focuses on providing relief from the symptoms and stress of a serious illness. The goal is to improve quality of life for both the patient and the family.  Medical History Review and Understanding:    Social History:    Functional and Nutritional State:    Palliative Symptoms:    Advance Directives: A detailed discussion was had today regarding advanced directives.    Code Status: Concepts specific to code status, artifical feeding and hydration, continued IV antibiotics and rehospitalization was had.  The difference between a aggressive medical intervention path  and a palliative comfort care path for this patient at this time was had.   Encouraged patient/family to consider DNR/DNI status understanding evidenced based poor outcomes in similar hospitalized patient, as the cause of arrest is likely associated with  advanced chronic/terminal illness rather than an easily reversible acute cardio-pulmonary event. I explained that DNR/DNI does not change the medical plan and it only comes into effect after a person has arrested (died).  It is a protective measure to keep [place] from harming the patient in their last moments of life. *** was agreeable to DNR/DNI with understanding that patient would not receive CPR, defibrillation, ACLS medications, or intubation.   Discussion:    Discussed the importance of continued conversation with family and their  medical providers regarding overall plan of care and treatment options, ensuring decisions are within the context of the patients values and GOCs.  Provided *** "Hard Choices for Loving People" booklet.   Provided *** "Gone From My Site" booklet.  Decision Maker:  SUMMARY OF RECOMMENDATIONS   DNAR/DNI  Code Status/Advance Care Planning: DNAR/DNI   Symptom Management:   Palliative Prophylaxis:  Aspiration, Bowel Regimen, Delirium Protocol, Frequent Pain Assessment, Oral Care, Palliative Wound Care, and Turn Reposition  Additional Recommendations (Limitations, Scope, Preferences): Avoid Hospitalization, Full Scope Treatment, No Artificial Feeding, No Surgical Procedures, and No Tracheostomy  Psycho-social/Spiritual:  Desire for further Chaplaincy support:  Additional Recommendations:    Prognosis:   Discharge Planning:   Vitals:   03/18/22 1400 03/18/22 1500  BP: (!) 145/59 (!) 143/64  Pulse: 78 84  Resp: 18 (!) 27  Temp:    SpO2: 93% 95%    Intake/Output Summary (Last 24 hours) at 03/18/2022 1607 Last data filed at 03/18/2022 1245 Gross per 24 hour  Intake 1025 ml  Output 1180 ml  Net -155 ml   Last Weight  Most recent update: 03/18/2022  4:26 AM    Weight  94.6 kg (208 lb 8.9 oz)            Gen:  NAD HEENT: moist mucous membranes CV: Regular rate and rhythm, no murmurs rubs or gallops PULM: clear to auscultation bilaterally. No  wheezes/rales/rhonchi*** ABD: soft/nontender/nondistended/normal bowel sounds*** EXT: No edema*** Neuro: Alert and oriented x3***  PPS:   This conversation/these recommendations were discussed with patient primary care team, Dr. Marland Kitchen  Time In: Time Out: Total Time: ***  Billing based on MDM: ***  {Problems Addressed:304933}  {Amount and/or Complexity of RV:1007511  {Risks:304936} ______________________________________________________ [place] Palliative Medicine Team Team Cell Phone: [phone] Please utilize secure chat with additional questions, if there is no response within 30 minutes please call the above phone number  Palliative Medicine Team providers are available by phone from 7am to 7pm daily and can be reached through the team cell phone.  Should this patient require assistance outside of these hours, please call the patient's attending physician.

## 2022-03-18 NOTE — Progress Notes (Signed)
   Palliative Medicine Inpatient Follow Up Note   The PMT has acknowledged the consultation for [facility].   Patient spouse, Jordan Hawks has been called to set up a date and meeting time. A HIPAA compliant VM was left.  Awaiting a response.  No Charge ______________________________________________________________________________________ [place] Team Team Cell Phone: [phone] Please utilize secure chat with additional questions, if there is no response within 30 minutes please call the above phone number  Palliative Medicine Team providers are available by phone from 7am to 7pm daily and can be reached through the team cell phone.  Should this patient require assistance outside of these hours, please call the patient's attending physician.

## 2022-03-18 NOTE — Progress Notes (Signed)
Patient ID: David Mendoza, male   DOB: Jul 24, 1940, 82 y.o.   MRN: [id number] Follow up - Trauma Critical Care   Patient Details:    David Mendoza is an 82 y.o. male.  Lines/tubes : PICC Double Lumen 0000000 Right Basilic 40 cm 0 cm (Active)  Indication for Insertion or Continuance of Line Poor Vasculature-patient has had multiple peripheral attempts or PIVs lasting less than 24 hours 03/17/22 0800  Exposed Catheter (cm) 0 cm 03/15/22 0854  Site Assessment Clean, Dry, Intact 03/17/22 0800  Lumen #1 Status Flushed;Saline locked 03/17/22 0800  Lumen #2 Status Saline locked;Flushed 03/17/22 0800  Dressing Type Transparent 03/17/22 0800  Dressing Status Antimicrobial disc in place 03/17/22 0800  Safety Lock Not Applicable 0000000 0000000  Line Care Connections checked and tightened 03/17/22 0800  Line Adjustment (NICU/IV Team Only) No 03/15/22 0854  Dressing Intervention New dressing;Other (Comment) 03/15/22 0854  Dressing Change Due 03/22/22 03/17/22 0800     Urethral Catheter Amy Dorothyann Peng Straight-tip 16 Fr. (Active)  Indication for Insertion or Continuance of Catheter Therapy based on hourly urine output monitoring and documentation for critical condition (NOT STRICT I&O) 03/17/22 0800  Site Assessment Clean, Dry, Intact 03/17/22 0800  Catheter Maintenance Bag below level of bladder;Catheter secured;Drainage bag/tubing not touching floor;No dependent loops;Insertion date on drainage bag;Bag emptied prior to transport;Seal intact 03/17/22 0800  Collection Container Standard drainage bag 03/17/22 0800  Securement Method Securing device (Describe) 03/17/22 0800  Urinary Catheter Interventions (if applicable) Unclamped [id number]  Input (mL) 0 mL 03/11/22 1600  Output (mL) 70 mL 03/17/22 0900    Microbiology/Sepsis markers: Results for orders placed or performed during the hospital encounter of 03/09/22  MRSA Next Gen by PCR, Nasal     Status: None   Collection Time: 03/09/22  5:00 PM    Specimen: Nasal Mucosa; Nasal Swab  Result Value Ref Range Status   MRSA by PCR Next Gen NOT DETECTED NOT DETECTED Final    Comment: (NOTE) The GeneXpert MRSA Assay (FDA approved for NASAL specimens only), is one component of a comprehensive MRSA colonization surveillance program. It is not intended to diagnose MRSA infection nor to guide or monitor treatment for MRSA infections. Test performance is not FDA approved in patients less than 49 years old. Performed at [facility], [place] [address] [place], [place] [address]     Anti-infectives:  Anti-infectives (From admission, onward)    Start     Dose/Rate Route Frequency Ordered Stop   03/09/22 1400  ceFAZolin (ANCEF) IVPB 2g/100 mL premix        2 g 200 mL/hr over 30 Minutes Intravenous  Once 03/09/22 1346 03/09/22 1600       Consults: Treatment Team:  Md, Trauma, MD Kristeen Miss, MD   Subjective:    Overnight Issues:  Progressive wheezing overnight Objective:  Vital signs for last 24 hours: Temp:  [96.4 F (35.8 C)-99.1 F (37.3 C)] 99.1 F (37.3 C) (03/09 0400) Pulse Rate:  [64-76] 75 (03/09 0700) Resp:  [17-35] 27 (03/09 0700) BP: (116-154)/(51-101) 136/63 (03/09 0700) SpO2:  [95 %-99 %] 95 % (03/09 0700) Weight:  [94.6 kg] 94.6 kg (03/09 0426)  Intake/Output from previous day: 03/08 0701 - 03/09 0700 In: 1620 [NG/GT:1620] Out: 565 [Urine:565]  Intake/Output this shift: Total I/O In: 100 [NG/GT:100] Out: 170 [Urine:170]   Physical Exam:  General: Unresponsive Neuro: Does not follow commands, reportedly can track with eyes if eyes are held open, does not open eyes to  voice. This is stable from yesterday per bedside RN.  HEENT/Neck: cortrak, TF '@55'$  Resp: bilateral wheeze, tachypneic @ 35, sats mid 90s on 4L [place] CVS: RRR. 75 GI: soft, NT Extremities: edema 2+  Results for orders placed or performed during the hospital encounter of 03/09/22 (from the past 24 hour(s))  Glucose, capillary      Status: Abnormal   Collection Time: 03/17/22 11:36 AM  Result Value Ref Range   Glucose-Capillary 202 (H) 70 - 99 mg/dL  Glucose, capillary     Status: Abnormal   Collection Time: 03/17/22  3:36 PM  Result Value Ref Range   Glucose-Capillary 174 (H) 70 - 99 mg/dL  Glucose, capillary     Status: Abnormal   Collection Time: 03/17/22  7:20 PM  Result Value Ref Range   Glucose-Capillary 190 (H) 70 - 99 mg/dL  Glucose, capillary     Status: Abnormal   Collection Time: 03/17/22 11:11 PM  Result Value Ref Range   Glucose-Capillary 176 (H) 70 - 99 mg/dL  Glucose, capillary     Status: Abnormal   Collection Time: 03/18/22  3:27 AM  Result Value Ref Range   Glucose-Capillary 117 (H) 70 - 99 mg/dL  CBC     Status: Abnormal   Collection Time: 03/18/22  6:02 AM  Result Value Ref Range   WBC 11.2 (H) 4.0 - 10.5 K/uL   RBC 2.55 (L) 4.22 - 5.81 MIL/uL   Hemoglobin 7.8 (L) 13.0 - 17.0 g/dL   HCT 25.3 (L) 39.0 - 52.0 %   MCV 99.2 80.0 - 100.0 fL   MCH 30.6 26.0 - 34.0 pg   MCHC 30.8 30.0 - 36.0 g/dL   RDW 14.4 11.5 - 15.5 %   Platelets 294 150 - 400 K/uL   nRBC 0.0 0.0 - 0.2 %  Basic metabolic panel     Status: Abnormal   Collection Time: 03/18/22  6:02 AM  Result Value Ref Range   Sodium 147 (H) 135 - 145 mmol/L   Potassium 4.6 3.5 - 5.1 mmol/L   Chloride 110 98 - 111 mmol/L   CO2 30 22 - 32 mmol/L   Glucose, Bld 71 70 - 99 mg/dL   BUN 56 (H) 8 - 23 mg/dL   Creatinine, Ser 2.17 (H) 0.61 - 1.24 mg/dL   Calcium 7.8 (L) 8.9 - 10.3 mg/dL   GFR, Estimated 30 (L) >60 mL/min   Anion gap 7 5 - 15  Glucose, capillary     Status: Abnormal   Collection Time: 03/18/22  7:48 AM  Result Value Ref Range   Glucose-Capillary 53 (L) 70 - 99 mg/dL    Assessment & Plan: Present on Admission:  TBI (traumatic brain injury) ([place])    LOS: 9 days   Additional comments:I reviewed the patient's new clinical lab test results. / Fall Bilateral SDH, falcine hematoma - NSGY Dr. Ellene Route consulted.  Suspects mostly chronic small acute component with h/o craniotomy 2016 for b/l SDH. Keppra for seizure prophylaxis. Repeat CT head 3/1 stable. EEG done, MRI 3/6 shows small acute infarcts R midbrain and R cerebellum, L post temp ICC, B SDH - stroke W/U per Neurology including CT head, MRA MRV 3/7, CT 3/8 stable. Expected evolution. ASA. Neuro exam stable compared to NSG/neuro notes yesterday. Scalp laceration with hematoma - stapled by EDP. Removal 3/14 Acute hypoxic respiratory failure - extubated 3/6, wheezing/tachyneic this morning. CXR, albuterol neb, iv lasix '40mg'$  x 1, hold TF  Pancreatic duct stricture - incidental finding.  Will need outpatient follow up HTN - home Coreg CKD stage 4B - CRT fairly stable H/o multiple falls - not on anticoagulation at home  FEN: TF Hold for now in case reintubated, lokelma for hyperkalemia which is now improved ID: ancef in ED VTE: SCDs. Lovenox Foley - Placed for I/O, penis is swollen Dispo: Respiratory decline. Hopefully diuresis will help. Needs GOC discussion with family given poor neuro prognosis at this point  Addendum-chest x-ray shows feeding tube in the distal esophagus, small effusions and atelectasis, mild cardiomegaly.  RN has advanced feeding tube and plain film of the abdomen pending.  Some improvement with deep suctioning.  I have updated his wife regarding his status and she will be at the hospital later this morning.  Ongoing goals of care discussions.  Critical Care Total Time*: [facility] Use [email or web address] to contact on call provider  03/18/2022  *Care during the described time interval was provided by me. I have reviewed this patient's available data, including medical history, events of note, physical examination and test results as part of my evaluation.

## 2022-03-19 LAB — GLUCOSE, CAPILLARY
Glucose-Capillary: 140 mg/dL — ABNORMAL HIGH (ref 70–99)
Glucose-Capillary: 121 mg/dL — ABNORMAL HIGH (ref 70–99)
Glucose-Capillary: 123 mg/dL — ABNORMAL HIGH (ref 70–99)
Glucose-Capillary: 141 mg/dL — ABNORMAL HIGH (ref 70–99)
Glucose-Capillary: 138 mg/dL — ABNORMAL HIGH (ref 70–99)
Glucose-Capillary: 109 mg/dL — ABNORMAL HIGH (ref 70–99)
Glucose-Capillary: 116 mg/dL — ABNORMAL HIGH (ref 70–99)

## 2022-03-19 LAB — CBC
HCT: 23.9 % — ABNORMAL LOW (ref 39.0–52.0)
Hemoglobin: 7.3 g/dL — ABNORMAL LOW (ref 13.0–17.0)
MCH: 30.5 pg (ref 26.0–34.0)
MCHC: 30.5 g/dL (ref 30.0–36.0)
MCV: 100 fL (ref 80.0–100.0)
Platelets: 307 10*3/uL (ref 150–400)
RBC: 2.39 MIL/uL — ABNORMAL LOW (ref 4.22–5.81)
RDW: 14.4 % (ref 11.5–15.5)
WBC: 9.8 10*3/uL (ref 4.0–10.5)
nRBC: 0 % (ref 0.0–0.2)

## 2022-03-19 LAB — BASIC METABOLIC PANEL
Anion gap: 9 (ref 5–15)
BUN: 60 mg/dL — ABNORMAL HIGH (ref 8–23)
CO2: 29 mmol/L (ref 22–32)
Calcium: 7.9 mg/dL — ABNORMAL LOW (ref 8.9–10.3)
Chloride: 109 mmol/L (ref 98–111)
Creatinine, Ser: 2.24 mg/dL — ABNORMAL HIGH (ref 0.61–1.24)
GFR, Estimated: 29 mL/min — ABNORMAL LOW (ref >60)
Glucose, Bld: 139 mg/dL — ABNORMAL HIGH (ref 70–99)
Potassium: 4.8 mmol/L (ref 3.5–5.1)
Sodium: 147 mmol/L — ABNORMAL HIGH (ref 135–145)

## 2022-03-19 MED ORDER — FREE WATER
200.0000 mL | Status: DC
  Administered 2022-03-19 – 2022-03-20 (×6): 200 mL

## 2022-03-19 MED ORDER — ACETAMINOPHEN 160 MG/5ML PO SOLN: 650.0000 mg | Freq: Four times a day (QID) | ORAL | Status: DC | PRN

## 2022-03-19 NOTE — Progress Notes (Addendum)
At present, patient more alert than previous two days. Patient consistently following commands during this exam- sticking out tongue, wiggling toes, giving thumbs up. Attempting to say name when asked by this RN. Patient still, however, unable to adequately clear secretions on his own, cough weak and congested. NT/OP suctioned when appropriate with thick, tan secretions noted.

## 2022-03-19 NOTE — Progress Notes (Signed)
   Palliative Medicine Inpatient Follow Up Note  I spoke to patients daughter, Neoma Laming this morning. We reviewed the importance of a meeting in the setting of patients tenuous health state. She was vocally overwhelmed by emotion though does shares understanding. She plans to speak to her family to determine the best day and meeting time.  We reviewed that today patients wife would like to spend time with Lennette Bihari without difficult conversations. Though, family is willing to meet tomorrow at [place] for formal meeting tomorrow morning. Initial consultation note to follow.  No Charge ______________________________________________________________________________________ [place] Team Team Cell Phone: [phone] Please utilize secure chat with additional questions, if there is no response within 30 minutes please call the above phone number  Palliative Medicine Team providers are available by phone from 7am to 7pm daily and can be reached through the team cell phone.  Should this patient require assistance outside of these hours, please call the patient's attending physician.

## 2022-03-19 NOTE — Progress Notes (Signed)
Patient ID: David Mendoza, male   DOB: [birth year], 82 y.o.   MRN: [id number] Follow up - Trauma Critical Care   Patient Details:    David Mendoza is an 82 y.o. male.  Lines/tubes : PICC Double Lumen 0000000 Right Basilic 40 cm 0 cm (Active)  Indication for Insertion or Continuance of Line Poor Vasculature-patient has had multiple peripheral attempts or PIVs lasting less than 24 hours 03/17/22 0800  Exposed Catheter (cm) 0 cm 03/15/22 0854  Site Assessment Clean, Dry, Intact 03/17/22 0800  Lumen #1 Status Flushed;Saline locked 03/17/22 0800  Lumen #2 Status Saline locked;Flushed 03/17/22 0800  Dressing Type Transparent 03/17/22 0800  Dressing Status Antimicrobial disc in place 03/17/22 0800  Safety Lock Not Applicable 0000000 0000000  Line Care Connections checked and tightened 03/17/22 0800  Line Adjustment (NICU/IV Team Only) No 03/15/22 0854  Dressing Intervention New dressing;Other (Comment) 03/15/22 0854  Dressing Change Due 03/22/22 03/17/22 0800     Urethral Catheter Amy Dorothyann Peng Straight-tip 16 Fr. (Active)  Indication for Insertion or Continuance of Catheter Therapy based on hourly urine output monitoring and documentation for critical condition (NOT STRICT I&O) 03/17/22 0800  Site Assessment Clean, Dry, Intact 03/17/22 0800  Catheter Maintenance Bag below level of bladder;Catheter secured;Drainage bag/tubing not touching floor;No dependent loops;Insertion date on drainage bag;Bag emptied prior to transport;Seal intact 03/17/22 0800  Collection Container Standard drainage bag 03/17/22 0800  Securement Method Securing device (Describe) 03/17/22 0800  Urinary Catheter Interventions (if applicable) Unclamped 99991111 0730  Input (mL) 0 mL 03/11/22 1600  Output (mL) 70 mL 03/17/22 0900    Microbiology/Sepsis markers: Results for orders placed or performed during the hospital encounter of 03/09/22  MRSA Next Gen by PCR, Nasal     Status: None   Collection Time: 03/09/22  5:00 PM    Specimen: Nasal Mucosa; Nasal Swab  Result Value Ref Range Status   MRSA by PCR Next Gen NOT DETECTED NOT DETECTED Final    Comment: (NOTE) The GeneXpert MRSA Assay (FDA approved for NASAL specimens only), is one component of a comprehensive MRSA colonization surveillance program. It is not intended to diagnose MRSA infection nor to guide or monitor treatment for MRSA infections. Test performance is not FDA approved in patients less than 6 years old. Performed at [facility], [place] [address], [place], [place] [address]     Anti-infectives:  Anti-infectives (From admission, onward)    Start     Dose/Rate Route Frequency Ordered Stop   03/09/22 1400  ceFAZolin (ANCEF) IVPB 2g/100 mL premix        2 g 200 mL/hr over 30 Minutes Intravenous  Once 03/09/22 1346 03/09/22 1600       Consults: Treatment Team:  Md, Trauma, MD Kristeen Miss, MD   Subjective:    Overnight Issues:  Respiratory issues slightly better this AM Objective:  Vital signs for last 24 hours: Temp:  [98.1 F (36.7 C)-99.1 F (37.3 C)] 98.1 F (36.7 C) (03/10 0400) Pulse Rate:  [68-84] 69 (03/10 0700) Resp:  [18-39] 18 (03/10 0700) BP: (122-153)/(43-78) 139/62 (03/10 0700) SpO2:  [92 %-98 %] 95 % (03/10 0700)  Intake/Output from previous day: 03/09 0701 - 03/10 0700 In: 1325 [NG/GT:1325] Out: 1840 [Urine:1840]  Intake/Output this shift: No intake/output data recorded.   Physical Exam:  General: Unresponsive Neuro: Does not follow commands, opens eyes to noxious stimulus, maybe tracks with eyes but not consistent. This is stable from yesterday per bedside RN.  HEENT/Neck: cortrak, TF @  43 Resp: bilateral wheeze, weak cough, sats 98% on [place], RR 22 CVS: RRR. 75 GI: soft, NT Extremities: edema 2+  Results for orders placed or performed during the hospital encounter of 03/09/22 (from the past 24 hour(s))  Glucose, capillary     Status: Abnormal   Collection Time: 03/18/22  7:48 AM  Result  Value Ref Range   Glucose-Capillary 53 (L) 70 - 99 mg/dL  Glucose, capillary     Status: Abnormal   Collection Time: 03/18/22  7:51 AM  Result Value Ref Range   Glucose-Capillary 49 (L) 70 - 99 mg/dL  Glucose, capillary     Status: Abnormal   Collection Time: 03/18/22  8:21 AM  Result Value Ref Range   Glucose-Capillary 112 (H) 70 - 99 mg/dL  Glucose, capillary     Status: None   Collection Time: 03/18/22 11:49 AM  Result Value Ref Range   Glucose-Capillary 93 70 - 99 mg/dL  Glucose, capillary     Status: Abnormal   Collection Time: 03/18/22  3:45 PM  Result Value Ref Range   Glucose-Capillary 214 (H) 70 - 99 mg/dL  Glucose, capillary     Status: Abnormal   Collection Time: 03/18/22  7:11 PM  Result Value Ref Range   Glucose-Capillary 181 (H) 70 - 99 mg/dL  Glucose, capillary     Status: Abnormal   Collection Time: 03/18/22 11:15 PM  Result Value Ref Range   Glucose-Capillary 159 (H) 70 - 99 mg/dL  Glucose, capillary     Status: Abnormal   Collection Time: 03/19/22  3:38 AM  Result Value Ref Range   Glucose-Capillary 140 (H) 70 - 99 mg/dL  CBC     Status: Abnormal   Collection Time: 03/19/22  5:00 AM  Result Value Ref Range   WBC 9.8 4.0 - 10.5 K/uL   RBC 2.39 (L) 4.22 - 5.81 MIL/uL   Hemoglobin 7.3 (L) 13.0 - 17.0 g/dL   HCT 23.9 (L) 39.0 - 52.0 %   MCV 100.0 80.0 - 100.0 fL   MCH 30.5 26.0 - 34.0 pg   MCHC 30.5 30.0 - 36.0 g/dL   RDW 14.4 11.5 - 15.5 %   Platelets 307 150 - 400 K/uL   nRBC 0.0 0.0 - 0.2 %  Basic metabolic panel     Status: Abnormal   Collection Time: 03/19/22  5:00 AM  Result Value Ref Range   Sodium 147 (H) 135 - 145 mmol/L   Potassium 4.8 3.5 - 5.1 mmol/L   Chloride 109 98 - 111 mmol/L   CO2 29 22 - 32 mmol/L   Glucose, Bld 139 (H) 70 - 99 mg/dL   BUN 60 (H) 8 - 23 mg/dL   Creatinine, Ser 2.24 (H) 0.61 - 1.24 mg/dL   Calcium 7.9 (L) 8.9 - 10.3 mg/dL   GFR, Estimated 29 (L) >60 mL/min   Anion gap 9 5 - 15  Glucose, capillary     Status:  Abnormal   Collection Time: 03/19/22  7:19 AM  Result Value Ref Range   Glucose-Capillary 121 (H) 70 - 99 mg/dL    Assessment & Plan: Present on Admission:  TBI (traumatic brain injury) ([place])    LOS: 10 days   Additional comments:I reviewed the patient's new clinical lab test results. / Fall Bilateral SDH, falcine hematoma - NSGY Dr. Ellene Route consulted. Suspects mostly chronic small acute component with h/o craniotomy 2016 for b/l SDH. Keppra for seizure prophylaxis. Repeat CT head 3/1 stable. EEG done, MRI  3/6 shows small acute infarcts R midbrain and R cerebellum, L post temp ICC, B SDH - stroke W/U per Neurology including CT head, MRA MRV 3/7, CT 3/8 stable. Expected evolution. ASA. Neuro exam stable compared to NSG/neuro notes yesterday. Scalp laceration with hematoma - stapled by EDP. Removal 3/14 Acute hypoxic respiratory failure - extubated 3/6, wheezing/tachyneic 3/9-->CXR (feeding tube in esophagus, small effusions and atelectasis, mild cardiomegaly), albuterol neb, iv lasix '40mg'$  x 1, TF held until tube repositioned) . Slightly better this AM Pancreatic duct stricture - incidental finding. Will need outpatient follow up HTN - home Coreg CKD stage 4B - CRT and BUN stably elevated. Na trending up. Will hold further diuresis today, increase free water flushes per tube, recheck tomorrow H/o multiple falls - not on anticoagulation at home  FEN: TF '@55'$ ,  ID: ancef in ED VTE: SCDs. Lovenox Foley - Placed for I/O, penis is swollen Dispo: Respiratory decline, trouble clearing secretions. Code status now DNR, palliative care consult  Critical Care Total Time*: [facility] Use [email or web address] to contact on call provider  03/19/2022  *Care during the described time interval was provided by me. I have reviewed this patient's available data, including medical history, events of note, physical examination and test results as part of my  evaluation.

## 2022-03-19 NOTE — Evaluation (Signed)
Clinical/Bedside Swallow Evaluation Patient Details  Name: David Mendoza MRN: [id number] Date of Birth: 08-30-1940  Today's Date: 03/19/2022 Time: SLP Start Time (ACUTE ONLY): 1106 SLP Stop Time (ACUTE ONLY): 1133 SLP Time Calculation (min) (ACUTE ONLY): 27 min  Past Medical History:  Past Medical History:  Diagnosis Date   Chronic anticoagulation    Diabetic retinopathy ([place])    DVT (deep venous thrombosis) ([place])    x2   Hyperlipemia    Hypertension    Multiple falls    Nuclear sclerotic cataract of left eye 12/25/2019   Nuclear sclerotic cataract of right eye 12/25/2019   Peripheral neuropathy    Posterior vitreous detachment of left eye 12/25/2019   Sciatica    Past Surgical History:  Past Surgical History:  Procedure Laterality Date   CRANIOTOMY Bilateral 04/07/2014   Procedure: BILATERAL CRANIOTOMY HEMATOMA EVACUATION SUBDURAL;  Surgeon: Karie Chimera, MD;  Location: [facility];  Service: Neurosurgery;  Laterality: Bilateral;   PILONIDAL CYST EXCISION     HPI:  82 y.o. male who presented 03/09/22 after a fall. Patient stepped off a curb and fell from standing height. GCS 14; extensor posturing; CT chronic bilateral SDH, acute falcine hematoma; intubated; EEG severe encephalopathy; MRI Small acute infarcts in the right midbrain and right cerebellum, left temporal ICH; extubated 3/6; .  PMH significant for traumatic SDH s/p craniotomy in 2016, DVT, HTN, HLD, DM2 complicated by retinopathy and neuropathy    Assessment / Plan / Recommendation  Clinical Impression  David Mendoza was lethargic, unable to follow commands. Able to elicit some participation in preliminary bedside swallowing assessment.  Oral care was provided; no spontaneous swallow response was observed.  He was presented with single ice chips and 1/2 teaspoons of water with max cueing provided. There was no lingual/labial movement in response to stimulus at lips or on tongue.  Portion of water bolus passed to posterior  oral cavity passively. Oral suctioning was continually provided; no swallow response occurred. Discussed current swallowing deficits with his wife and dtr, son-in-law, at bedside. Will return for further bedside trials of POs.  Continue NPO and frequent oral care. D/W RN. Will follow. SLP Visit Diagnosis: Dysphagia, oropharyngeal phase (R13.12)    Aspiration Risk  Severe aspiration risk    Diet Recommendation   NPO  Medication Administration: Via alternative means    Other  Recommendations Oral Care Recommendations: Oral care QID    Recommendations for follow up therapy are one component of a multi-disciplinary discharge planning process, led by the attending physician.  Recommendations may be updated based on patient status, additional functional criteria and insurance authorization.  Follow up Recommendations Other (comment) (tba)      Assistance Recommended at Discharge    Functional Status Assessment Patient has had a recent decline in their functional status and demonstrates the ability to make significant improvements in function in a reasonable and predictable amount of time.  Frequency and Duration min 3x week  2 weeks       Prognosis Prognosis for improved oropharyngeal function: Guarded      Swallow Study   General Date of Onset: 03/09/22 HPI: 82 y.o. male who presented 03/09/22 after a fall. Patient stepped off a curb and fell from standing height. GCS 14; extensor posturing; CT chronic bilateral SDH, acute falcine hematoma; intubated; EEG severe encephalopathy; MRI Small acute infarcts in the right midbrain and right cerebellum, left temporal ICH; extubated 3/6; .  PMH significant for traumatic SDH s/p craniotomy in 2016, DVT, HTN,  HLD, DM2 complicated by retinopathy and neuropathy Type of Study: Bedside Swallow Evaluation Previous Swallow Assessment: no Diet Prior to this Study: NPO;Cortrak/Small bore NG tube Temperature Spikes Noted: No Respiratory Status: Nasal  cannula History of Recent Intubation: Yes Total duration of intubation (days): 6 days Date extubated: 03/15/22 Behavior/Cognition: Lethargic/Drowsy Oral Cavity Assessment: Dried secretions Oral Care Completed by SLP: Yes Oral Cavity - Dentition: Adequate natural dentition Self-Feeding Abilities: Total assist Patient Positioning: Upright in bed Baseline Vocal Quality: Not observed Volitional Cough: Cognitively unable to elicit Volitional Swallow: Unable to elicit    Oral/Motor/Sensory Function Overall Oral Motor/Sensory Function: Generalized oral weakness   Ice Chips Ice chips: Impaired Presentation: Spoon Oral Phase Impairments: Poor awareness of bolus;Reduced lingual movement/coordination;Reduced labial seal Pharyngeal Phase Impairments: Unable to trigger swallow   Thin Liquid Thin Liquid: Impaired Presentation: Spoon Oral Phase Impairments: Poor awareness of bolus Pharyngeal  Phase Impairments: Unable to trigger swallow    Nectar Thick Nectar Thick Liquid: Not tested   Honey Thick Honey Thick Liquid: Not tested   Puree Puree: Not tested   Solid     Solid: Not tested      Juan Quam Laurice 03/19/2022,11:45 AM  Estill Bamberg L. Tivis Ringer, MA CCC/SLP Clinical Specialist - [place] Office number [phone]

## 2022-03-20 ENCOUNTER — Inpatient Hospital Stay (HOSPITAL_COMMUNITY): Payer: Medicare Other

## 2022-03-20 DIAGNOSIS — Z7189 Other specified counseling: Secondary | ICD-10-CM

## 2022-03-20 LAB — BASIC METABOLIC PANEL
Anion gap: 4 — ABNORMAL LOW (ref 5–15)
BUN: 60 mg/dL — ABNORMAL HIGH (ref 8–23)
CO2: 31 mmol/L (ref 22–32)
Calcium: 7.4 mg/dL — ABNORMAL LOW (ref 8.9–10.3)
Chloride: 110 mmol/L (ref 98–111)
Creatinine, Ser: 2.26 mg/dL — ABNORMAL HIGH (ref 0.61–1.24)
GFR, Estimated: 28 mL/min — ABNORMAL LOW (ref >60)
Glucose, Bld: 223 mg/dL — ABNORMAL HIGH (ref 70–99)
Potassium: 5 mmol/L (ref 3.5–5.1)
Sodium: 145 mmol/L (ref 135–145)

## 2022-03-20 LAB — CBC
HCT: 24.1 % — ABNORMAL LOW (ref 39.0–52.0)
Hemoglobin: 7.3 g/dL — ABNORMAL LOW (ref 13.0–17.0)
MCH: 30.7 pg (ref 26.0–34.0)
MCHC: 30.3 g/dL (ref 30.0–36.0)
MCV: 101.3 fL — ABNORMAL HIGH (ref 80.0–100.0)
Platelets: 321 10*3/uL (ref 150–400)
RBC: 2.38 MIL/uL — ABNORMAL LOW (ref 4.22–5.81)
RDW: 14.4 % (ref 11.5–15.5)
WBC: 9.4 10*3/uL (ref 4.0–10.5)
nRBC: 0 % (ref 0.0–0.2)

## 2022-03-20 LAB — GLUCOSE, CAPILLARY
Glucose-Capillary: 201 mg/dL — ABNORMAL HIGH (ref 70–99)
Glucose-Capillary: 182 mg/dL — ABNORMAL HIGH (ref 70–99)

## 2022-03-20 MED ORDER — LORAZEPAM 2 MG/ML IJ SOLN: 0.5000 mg | INTRAMUSCULAR | Status: DC | PRN

## 2022-03-20 MED ORDER — BIOTENE DRY MOUTH MT LIQD: 15.0000 mL | OROMUCOSAL | Status: DC | PRN

## 2022-03-20 MED ORDER — HYDROMORPHONE HCL 1 MG/ML IJ SOLN: 0.5000 mg | INTRAMUSCULAR | 0 refills | Status: AC | PRN

## 2022-03-20 MED ORDER — HYDROMORPHONE HCL 1 MG/ML IJ SOLN: 0.5000 mg | INTRAMUSCULAR | Status: DC | PRN

## 2022-03-20 MED ORDER — BIOTENE DRY MOUTH MT LIQD: 15.0000 mL | OROMUCOSAL | Status: AC | PRN

## 2022-03-20 MED ORDER — ALBUTEROL SULFATE (2.5 MG/3ML) 0.083% IN NEBU: 2.5000 mg | INHALATION_SOLUTION | Freq: Four times a day (QID) | RESPIRATORY_TRACT | 12 refills | Status: AC | PRN

## 2022-03-20 MED ORDER — ALBUTEROL SULFATE (2.5 MG/3ML) 0.083% IN NEBU
2.5000 mg | INHALATION_SOLUTION | Freq: Once | RESPIRATORY_TRACT | Status: AC
  Administered 2022-03-20: 2.5 mg via RESPIRATORY_TRACT
  Filled 2022-03-20: qty 3

## 2022-03-20 MED ORDER — ORAL CARE MOUTH RINSE: 15.0000 mL | Freq: Four times a day (QID) | OROMUCOSAL | 0 refills | Status: AC | PRN

## 2022-03-20 MED ORDER — HYDROMORPHONE HCL 1 MG/ML IJ SOLN: 1.0000 mg | Freq: Four times a day (QID) | INTRAMUSCULAR | 0 refills | Status: AC

## 2022-03-20 MED ORDER — LORAZEPAM 2 MG/ML IJ SOLN: 1.0000 mg | Freq: Four times a day (QID) | INTRAMUSCULAR | 0 refills | Status: AC

## 2022-03-20 MED ORDER — RACEPINEPHRINE HCL 2.25 % IN NEBU
0.5000 mL | INHALATION_SOLUTION | Freq: Once | RESPIRATORY_TRACT | Status: AC
  Administered 2022-03-20: 0.5 mL via RESPIRATORY_TRACT
  Filled 2022-03-20: qty 0.5

## 2022-03-20 MED ORDER — FUROSEMIDE 10 MG/ML IJ SOLN: 40.0000 mg | Freq: Once | INTRAMUSCULAR | Status: DC

## 2022-03-20 MED ORDER — LORAZEPAM 2 MG/ML IJ SOLN: 0.5000 mg | INTRAMUSCULAR | 0 refills | Status: AC | PRN

## 2022-03-20 MED ORDER — HYDROMORPHONE HCL 1 MG/ML IJ SOLN
1.0000 mg | Freq: Four times a day (QID) | INTRAMUSCULAR | Status: DC
  Administered 2022-03-20 (×2): 1 mg via INTRAVENOUS
  Filled 2022-03-20 (×2): qty 1

## 2022-03-20 MED ORDER — LORAZEPAM 2 MG/ML IJ SOLN
1.0000 mg | Freq: Four times a day (QID) | INTRAMUSCULAR | Status: DC
  Administered 2022-03-20 (×3): 1 mg via INTRAVENOUS
  Filled 2022-03-20 (×3): qty 1

## 2022-03-20 MED ORDER — GLYCOPYRROLATE 0.2 MG/ML IJ SOLN
0.4000 mg | INTRAMUSCULAR | Status: DC
  Administered 2022-03-20 (×4): 0.4 mg via INTRAVENOUS
  Filled 2022-03-20 (×4): qty 2

## 2022-03-20 MED ORDER — ONDANSETRON HCL 4 MG/2ML IJ SOLN: 4.0000 mg | Freq: Four times a day (QID) | INTRAMUSCULAR | 0 refills | Status: AC | PRN

## 2022-03-20 MED ORDER — FUROSEMIDE 10 MG/ML IJ SOLN
60.0000 mg | Freq: Once | INTRAMUSCULAR | Status: AC
  Administered 2022-03-20: 60 mg via INTRAVENOUS
  Filled 2022-03-20: qty 6

## 2022-03-20 MED ORDER — POLYETHYLENE GLYCOL 3350 17 G PO PACK: 17.0000 g | PACK | Freq: Every day | ORAL | 0 refills | Status: AC

## 2022-03-20 MED ORDER — GLYCOPYRROLATE 0.2 MG/ML IJ SOLN: 0.4000 mg | INTRAMUSCULAR | Status: AC

## 2022-03-20 MED ORDER — POLYVINYL ALCOHOL 1.4 % OP SOLN: 1.0000 [drp] | Freq: Four times a day (QID) | OPHTHALMIC | 0 refills | Status: AC | PRN

## 2022-03-20 MED ORDER — POLYVINYL ALCOHOL 1.4 % OP SOLN: 1.0000 [drp] | Freq: Four times a day (QID) | OPHTHALMIC | Status: DC | PRN

## 2022-03-20 NOTE — Progress Notes (Addendum)
Manufacturing engineer [facility]) Hospital Liaison Note  Received request from [facility] for family interest in [facility]. Chart reviewed. Spoke with patient's family to confirm interest and explain services.   Bed offer from [facility] accepted by patient's daughter and family agreeable to transfer late this evening.  TOC aware.    Once [employer] paperwork is completed, I will update this note below so TOC can arrange transportation to arrive at 8:30 pm.    [facility] RN please call report to [phone] once MD discharge summary is posted in EPIC.    Thank you.   Gar Ponto, RN [facility] Liaison  [place] are on AMION   1849 UPDATE: [facility] CONSENTS HAVE BEEN COMPLETED AND THE FACILITY IS NOW READY TO RECEIVE THE PATIENT.  PER TOC, TRANSPORT HAS BEEN ARRANGED TO ARRIVE AT 8:30pm TONIGHT. PLEASE SEND DNR PAPER WORK WITH PATIENT. THANK YOU.

## 2022-03-20 NOTE — Progress Notes (Signed)
Family meeting with patient's wife, daughter, son-in-law (in person) and son (via phone), accompanied by his nurse and Sharyn Lull with Palliative. Conversation centered around goals of care and current clinical state. Discussed SLP eval and risk of aspiration and associated complications, even while remaining NPO. Revisited code status and decision for DNR and DNI. Offered option of aggressive medical care vs transition to comfort and wife and family elects for transition to comfort. Discussed palliation in-hospital vs hospice and family would prefer hospice setting if possible. I do think the patient is currently stable enough for transition to hospice, though we discussed his clinical course may change rapidly. Will contact hospice to see when bed may be available.   Critical care time: 12mn  AJesusita Oka MD General and [facility]

## 2022-03-20 NOTE — TOC Progression Note (Addendum)
Transition of Care [facility]) - Progression Note    Patient Details  Name: David Mendoza MRN: [id number] Date of Birth: 05-06-1940  Transition [facility]) CM/SW Contact  Ella Bodo, RN Phone Number: 03/20/2022, 11:35am  Clinical Narrative:    Family has chosen [place] for patient, and prefer residential hospice facility; notified [facility] representative, Margaretmary Eddy, of family's desire for [facility].  Olivia Mackie states there is a bed available today.  She states [facility] to work patient up and make contact with family.  Will follow for updates.     Addendum: 1:48pm No updates available from [facility] at this time, regarding timing of dc to [facility]. Sherry with [facility] has a phone meeting with family at 2pm.  Will follow for updates as available.  Addendum: 3:50pm Notified by Gar Ponto with [facility] that patient has been accepted to [facility] hospice facility, for admission after 8:30 PM tonight.  Family is agreeable to transport time.  Will notify [facility] for scheduled transport at 8:30 PM  Addendum: 4:40pm [facility] notified for 8:30pm pick up and transport to [facility].  Expected Discharge Plan: [place] Barriers to Discharge: Continued Medical Work up  Expected Discharge Plan and Services   Discharge Planning Services: CM Consult   Living arrangements for the past 2 months: Single Family Home                                       Social Determinants of Health (SDOH) Interventions SDOH Screenings   Food Insecurity: No Food Insecurity (01/15/2022)  Housing: Low Risk  (01/15/2022)  Transportation Needs: No Transportation Needs (01/15/2022)  Utilities: Not At Risk (01/15/2022)  Tobacco Use: Medium Risk (03/09/2022)    Readmission Risk Interventions     No data to display         Reinaldo Raddle, RN, BSN  Trauma/Neuro ICU Case Manager [phone]

## 2022-03-20 NOTE — Progress Notes (Signed)
Patient received scheduled medications and was given oral care prior to transportation service arrival. Patient, son-in-law was present at bedside. This RN did call patient's daughter, Neoma Laming to inform patient is leaving the facility at this time.

## 2022-03-20 NOTE — Progress Notes (Signed)
Report called to [facility] and given to [place]

## 2022-03-20 NOTE — Progress Notes (Signed)
Patient arrives to room 2W02 at this time

## 2022-03-20 NOTE — Progress Notes (Signed)
Speech Pathology:  Family has decided to transition David Mendoza to comfort care per M. Ferolito.  SLP service will respectfully sign off.  David Mendoza L. Tivis Ringer, MA CCC/SLP Clinical Specialist - [place] Office number [phone]

## 2022-03-20 NOTE — Progress Notes (Signed)
Trauma/Critical Care Follow Up Note  Subjective:    Overnight Issues:   Objective:  Vital signs for last 24 hours: Temp:  [97.2 F (36.2 C)-98.8 F (37.1 C)] 97.2 F (36.2 C) (03/11 0400) Pulse Rate:  [63-77] 70 (03/11 0700) Resp:  [17-40] 24 (03/11 0700) BP: (112-153)/(52-83) 133/74 (03/11 0700) SpO2:  [94 %-99 %] 97 % (03/11 0700) Weight:  [94.7 kg] 94.7 kg (03/11 0432)  Hemodynamic parameters for last 24 hours:    Intake/Output from previous day: 03/10 0701 - 03/11 0700 In: 2310 [NG/GT:2310] Out: 1412 [Urine:1410; Stool:2]  Intake/Output this shift: No intake/output data recorded.  Vent settings for last 24 hours:    Physical Exam:  Gen: comfortable, no distress Neuro: follows commands  HEENT: PERRL Neck: supple CV: RRR Pulm: unlabored breathing Abd: soft, NT GU: clear yellow urine, foley Extr: wwp, 2+ edema   Results for orders placed or performed during the hospital encounter of 03/09/22 (from the past 24 hour(s))  Glucose, capillary     Status: Abnormal   Collection Time: 03/19/22 11:23 AM  Result Value Ref Range   Glucose-Capillary 123 (H) 70 - 99 mg/dL  Glucose, capillary     Status: Abnormal   Collection Time: 03/19/22  3:22 PM  Result Value Ref Range   Glucose-Capillary 141 (H) 70 - 99 mg/dL  Glucose, capillary     Status: Abnormal   Collection Time: 03/19/22  7:13 PM  Result Value Ref Range   Glucose-Capillary 138 (H) 70 - 99 mg/dL  Glucose, capillary     Status: Abnormal   Collection Time: 03/19/22 11:09 PM  Result Value Ref Range   Glucose-Capillary 109 (H) 70 - 99 mg/dL  Glucose, capillary     Status: Abnormal   Collection Time: 03/19/22 11:10 PM  Result Value Ref Range   Glucose-Capillary 116 (H) 70 - 99 mg/dL  Glucose, capillary     Status: Abnormal   Collection Time: 03/20/22  3:14 AM  Result Value Ref Range   Glucose-Capillary 201 (H) 70 - 99 mg/dL  CBC     Status: Abnormal   Collection Time: 03/20/22  4:35 AM  Result Value  Ref Range   WBC 9.4 4.0 - 10.5 K/uL   RBC 2.38 (L) 4.22 - 5.81 MIL/uL   Hemoglobin 7.3 (L) 13.0 - 17.0 g/dL   HCT 24.1 (L) 39.0 - 52.0 %   MCV 101.3 (H) 80.0 - 100.0 fL   MCH 30.7 26.0 - 34.0 pg   MCHC 30.3 30.0 - 36.0 g/dL   RDW 14.4 11.5 - 15.5 %   Platelets 321 150 - 400 K/uL   nRBC 0.0 0.0 - 0.2 %  Basic metabolic panel     Status: Abnormal   Collection Time: 03/20/22  4:35 AM  Result Value Ref Range   Sodium 145 135 - 145 mmol/L   Potassium 5.0 3.5 - 5.1 mmol/L   Chloride 110 98 - 111 mmol/L   CO2 31 22 - 32 mmol/L   Glucose, Bld 223 (H) 70 - 99 mg/dL   BUN 60 (H) 8 - 23 mg/dL   Creatinine, Ser 2.26 (H) 0.61 - 1.24 mg/dL   Calcium 7.4 (L) 8.9 - 10.3 mg/dL   GFR, Estimated 28 (L) >60 mL/min   Anion gap 4 (L) 5 - 15  Glucose, capillary     Status: Abnormal   Collection Time: 03/20/22  7:59 AM  Result Value Ref Range   Glucose-Capillary 182 (H) 70 - 99 mg/dL  Assessment & Plan: The plan of care was discussed with the bedside nurse for the day, who is in agreement with this plan and no additional concerns were raised.   Present on Admission:  TBI (traumatic brain injury) ([place])    LOS: 11 days   Additional comments:I reviewed the patient's new clinical lab test results.   and I reviewed the patients new imaging test results.    Fall  Bilateral SDH, falcine hematoma - NSGY Dr. Ellene Route consulted. Suspects mostly chronic small acute component with h/o craniotomy 2016 for b/l SDH. Keppra for seizure prophylaxis. Repeat CT head 3/1 stable. EEG done, MRI 3/6 shows small acute infarcts R midbrain and R cerebellum, L post temp ICC, B SDH - stroke W/U per Neurology including CT head, MRA MRV 3/7, CT 3/8 stable. Expected evolution. ASA. Neuro exam stable. Scalp laceration with hematoma - stapled by EDP. Removal 3/14 Acute hypoxic respiratory failure - extubated 3/6, wheezing/tachyneic 3/9-->CXR (feeding tube in esophagus, small effusions and atelectasis, mild cardiomegaly),  albuterol/racemic neb this AM, IV lasix '60mg'$  x 1, repeat CXR this AM Pancreatic duct stricture - incidental finding. Will need outpatient follow up HTN - home Coreg CKD stage 4B - CRT and BUN stably elevated. Diurese today H/o multiple falls - not on anticoagulation at home   FEN: TF '@55'$ ,  ID: ancef in ED VTE: SCDs. Lovenox Foley - Placed for I/O, penis is swollen Dispo: ICU, impending respiratory failure, planned family meeting at [place] today to discuss decision re: intubation.   Critical Care Total Time: 35 minutes  David Oka, MD Trauma & General Surgery Please use [email or web address] to contact on call provider  03/20/2022  *Care during the described time interval was provided by me. I have reviewed this patient's available data, including medical history, events of note, physical examination and test results as part of my evaluation.

## 2022-03-20 NOTE — Discharge Summary (Signed)
[facility] Discharge Summary   Patient ID: David Mendoza MRN: [id number] DOB/AGE: [birth year] 82 y.o.  Admit date: 03/09/2022 Discharge date: 03/20/2022   Discharge Diagnosis Fall Bilateral SDH, falcine hematoma  Scalp laceration with hematoma Acute hypoxic respiratory failure  Pancreatic duct stricture  HTN CKD stage 4B  H/o multiple falls  Consultants Neurosurgery Neurology Palliative medicine   Imaging: DG Chest Port 1 View  Result Date: 03/20/2022 CLINICAL DATA:  Respiratory failure EXAM: PORTABLE CHEST 1 VIEW COMPARISON:  11/18/2022 FINDINGS: Feeding tube has been advanced into the stomach. PICC line unchanged. Stable cardiac silhouette. Bilateral small effusions. No pulmonary edema. IMPRESSION: Bilateral small effusions. Electronically Signed   By: Suzy Bouchard M.D.   On: 03/20/2022 09:02    Procedures None  Hospital Course:  David Mendoza is a 82 y.o. male who presented to the ED 2/29 as a level 2 trauma after a fall. Patient stepped off a curb and fell from standing height. Unclear if witnessed. Responsive initially but once in ED become altered with decreased GCS and was intubated. Taken to CT scanner for further workup. After return from scanner he is more purposeful in movements.  Work up revealed Bilateral SDH, falcine hematoma as well as small acute infarcts in right midbrain and right cerebellum. Patient did not progress well and palliative medicine was consulted. Multiple goals of care discussions were held with the family and they ultimately decided to pursue hospice. On 3/11 he was discharge to [facility].   Allergies as of 03/20/2022       Reactions   Sulfa Antibiotics Diarrhea        Medication List     STOP taking these medications    acetaminophen 500 MG tablet Commonly known as: TYLENOL   amLODipine 5 MG tablet Commonly known as: NORVASC   B-12 1000 MCG Tbcr   carvedilol 25 MG tablet Commonly known as: COREG    ferrous gluconate 324 MG tablet Commonly known as: FERGON   folic acid [id number] MCG tablet Commonly known as: FOLVITE   furosemide 40 MG tablet Commonly known as: LASIX   glipiZIDE 2.5 MG 24 hr tablet Commonly known as: GLUCOTROL XL   losartan 100 MG tablet Commonly known as: COZAAR   metFORMIN 500 MG tablet Commonly known as: GLUCOPHAGE   omeprazole 40 MG capsule Commonly known as: PRILOSEC   OVER THE COUNTER MEDICATION   sertraline 50 MG tablet Commonly known as: ZOLOFT   simvastatin 40 MG tablet Commonly known as: ZOCOR       TAKE these medications    albuterol (2.5 MG/3ML) 0.083% nebulizer solution Commonly known as: PROVENTIL Take 3 mLs (2.5 mg total) by nebulization every 6 (six) hours as needed for wheezing or shortness of breath.   antiseptic oral rinse Liqd Apply 15 mLs topically as needed for dry mouth.   mouth rinse Liqd solution 15 mLs by Mouth Rinse route 4 (four) times daily as needed.   glycopyrrolate 0.2 MG/ML injection Commonly known as: ROBINUL Inject 2 mLs (0.4 mg total) into the vein every 4 (four) hours.   HYDROmorphone 1 MG/ML injection Commonly known as: DILAUDID Inject 0.5-1 mLs (0.5-1 mg total) into the vein every hour as needed (Pain/Dyspnea).   HYDROmorphone 1 MG/ML injection Commonly known as: DILAUDID Inject 1 mL (1 mg total) into the vein every 6 (six) hours.   LORazepam 2 MG/ML injection Commonly known as: ATIVAN Inject 0.25-0.5 mLs (0.5-1 mg total) into the vein every hour as needed for anxiety,  seizure or sedation.   LORazepam 2 MG/ML injection Commonly known as: ATIVAN Inject 0.5 mLs (1 mg total) into the vein every 6 (six) hours.   ondansetron 4 MG/2ML Soln injection Commonly known as: ZOFRAN Inject 2 mLs (4 mg total) into the vein every 6 (six) hours as needed for nausea.   polyethylene glycol 17 g packet Commonly known as: MIRALAX / GLYCOLAX Place 17 g into feeding tube daily.   polyvinyl alcohol 1.4 %  ophthalmic solution Commonly known as: LIQUIFILM TEARS Place 1 drop into both eyes 4 (four) times daily as needed for dry eyes.          Follow-up Information     Penumalli, Earlean Polka, MD. Schedule an appointment as soon as possible for a visit in 1 month(s).   Specialties: Neurology, Radiology Contact information: [address] [place] [address] [phone]                  Signed: Margie Billet, [facility] 03/20/2022, 4:19 PM Please see Amion for pager number during day hours 7:00am-4:30pm

## 2022-04-10 DEATH — deceased
